# Patient Record
Sex: Female | Born: 1955 | Race: Black or African American | Hispanic: No | Marital: Single | Smoking: Never smoker
Health system: Southern US, Community
[De-identification: ages and names within clinical notes are randomized; demographics above are authoritative.]

## PROBLEM LIST (undated history)

## (undated) DIAGNOSIS — F0390 Unspecified dementia without behavioral disturbance: Secondary | ICD-10-CM

## (undated) DIAGNOSIS — F32A Depression, unspecified: Secondary | ICD-10-CM

## (undated) DIAGNOSIS — G4730 Sleep apnea, unspecified: Secondary | ICD-10-CM

## (undated) DIAGNOSIS — I639 Cerebral infarction, unspecified: Secondary | ICD-10-CM

## (undated) DIAGNOSIS — I10 Essential (primary) hypertension: Secondary | ICD-10-CM

## (undated) DIAGNOSIS — I2510 Atherosclerotic heart disease of native coronary artery without angina pectoris: Secondary | ICD-10-CM

## (undated) DIAGNOSIS — E119 Type 2 diabetes mellitus without complications: Secondary | ICD-10-CM

## (undated) DIAGNOSIS — M1990 Unspecified osteoarthritis, unspecified site: Secondary | ICD-10-CM

## (undated) DIAGNOSIS — E039 Hypothyroidism, unspecified: Secondary | ICD-10-CM

## (undated) DIAGNOSIS — N183 Chronic kidney disease, stage 3 unspecified: Secondary | ICD-10-CM

## (undated) HISTORY — PX: OTHER SURGICAL HISTORY: SHX169

---

## 1996-12-06 DIAGNOSIS — I10 Essential (primary) hypertension: Secondary | ICD-10-CM | POA: Insufficient documentation

## 2000-07-05 DIAGNOSIS — G4730 Sleep apnea, unspecified: Secondary | ICD-10-CM

## 2007-06-30 DIAGNOSIS — Z905 Acquired absence of kidney: Secondary | ICD-10-CM

## 2007-06-30 DIAGNOSIS — C641 Malignant neoplasm of right kidney, except renal pelvis: Secondary | ICD-10-CM

## 2007-06-30 HISTORY — DX: Acquired absence of kidney: Z90.5

## 2007-06-30 HISTORY — DX: Malignant neoplasm of right kidney, except renal pelvis: C64.1

## 2007-06-30 HISTORY — PX: NEPHRECTOMY: SHX65

## 2008-08-02 ENCOUNTER — Emergency Department: Payer: Self-pay | Admitting: Emergency Medicine

## 2009-05-08 DIAGNOSIS — K8020 Calculus of gallbladder without cholecystitis without obstruction: Secondary | ICD-10-CM | POA: Insufficient documentation

## 2012-03-21 DIAGNOSIS — C649 Malignant neoplasm of unspecified kidney, except renal pelvis: Secondary | ICD-10-CM | POA: Insufficient documentation

## 2012-06-29 DIAGNOSIS — I2699 Other pulmonary embolism without acute cor pulmonale: Secondary | ICD-10-CM

## 2012-06-29 HISTORY — DX: Other pulmonary embolism without acute cor pulmonale: I26.99

## 2012-10-03 DIAGNOSIS — I2699 Other pulmonary embolism without acute cor pulmonale: Secondary | ICD-10-CM | POA: Insufficient documentation

## 2013-04-01 ENCOUNTER — Emergency Department: Payer: Self-pay | Admitting: Emergency Medicine

## 2013-04-01 LAB — COMPREHENSIVE METABOLIC PANEL
Albumin: 4 g/dL (ref 3.4–5.0)
Alkaline Phosphatase: 87 U/L (ref 50–136)
BUN: 26 mg/dL — ABNORMAL HIGH (ref 7–18)
Co2: 27 mmol/L (ref 21–32)
Creatinine: 1.58 mg/dL — ABNORMAL HIGH (ref 0.60–1.30)
EGFR (Non-African Amer.): 35 — ABNORMAL LOW
Glucose: 121 mg/dL — ABNORMAL HIGH (ref 65–99)
Osmolality: 280 (ref 275–301)
SGOT(AST): 28 U/L (ref 15–37)
SGPT (ALT): 26 U/L (ref 12–78)
Total Protein: 8.3 g/dL — ABNORMAL HIGH (ref 6.4–8.2)

## 2013-04-01 LAB — URINALYSIS, COMPLETE
Bilirubin,UR: NEGATIVE
Blood: NEGATIVE
Glucose,UR: NEGATIVE mg/dL (ref 0–75)
Ketone: NEGATIVE
Leukocyte Esterase: NEGATIVE
Nitrite: NEGATIVE
Ph: 6 (ref 4.5–8.0)
Protein: NEGATIVE
RBC,UR: 2 /HPF (ref 0–5)
Specific Gravity: 1.014 (ref 1.003–1.030)
Squamous Epithelial: 1
WBC UR: <1 /HPF (ref 0–5)

## 2013-04-01 LAB — CBC
MCH: 32.2 pg (ref 26.0–34.0)
MCV: 95 fL (ref 80–100)
Platelet: 218 10*3/uL (ref 150–440)
RDW: 13.9 % (ref 11.5–14.5)

## 2013-04-01 LAB — LIPASE, BLOOD: Lipase: 284 U/L (ref 73–393)

## 2013-04-01 LAB — PROTIME-INR: INR: 1.9

## 2013-04-01 IMAGING — CT CT ABD-PELV W/O CM
1 of 2 series · 15 of 32 positions shown, 19 images · non-contrast
Comparison: none

REASON FOR EXAM: (1) R flank pain, h/o R kidney removed; (2) R flank
pain, h/o R kidney removed
COMMENTS:

[Series 2: 3mm soft tissue · axial · 0.86mm/px · z∈[-1122,-672]mm · 15 of 164 slices shown, 19 images]
[im 7/164  soft-tissue]
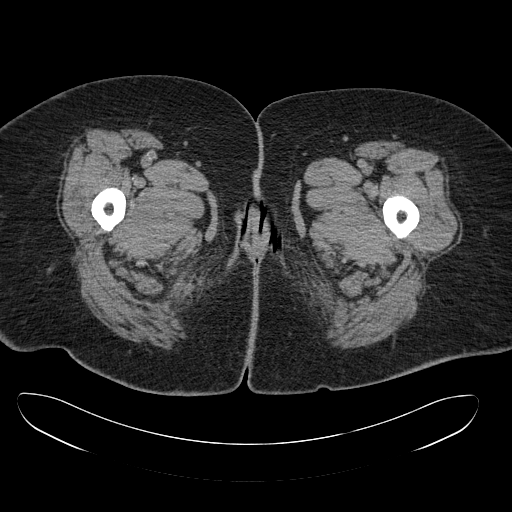
[im 7/164  bone]
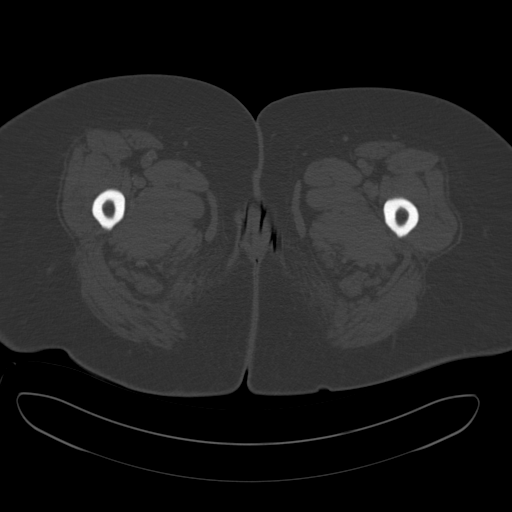
[im 20/164  soft-tissue]
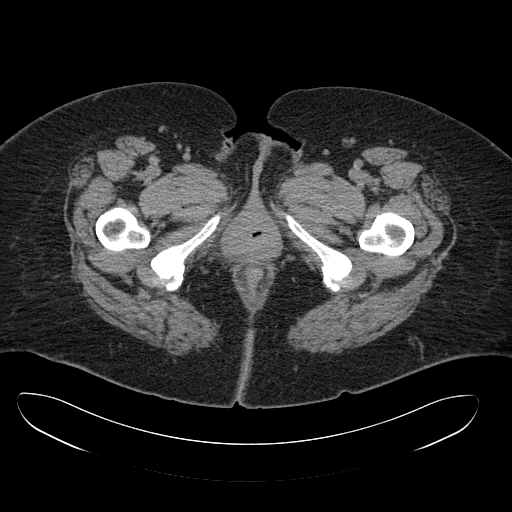
[im 33/164  soft-tissue]
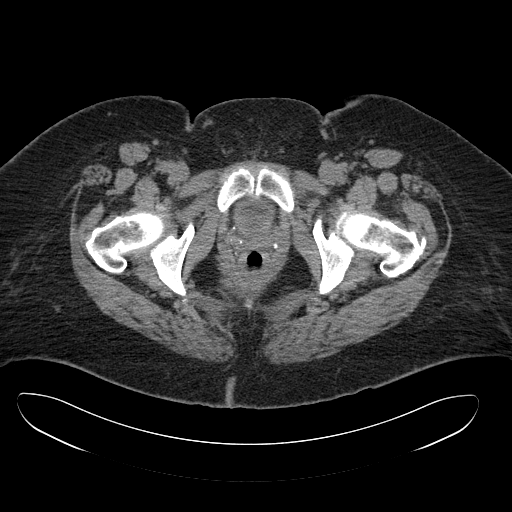
[im 46/164  soft-tissue]
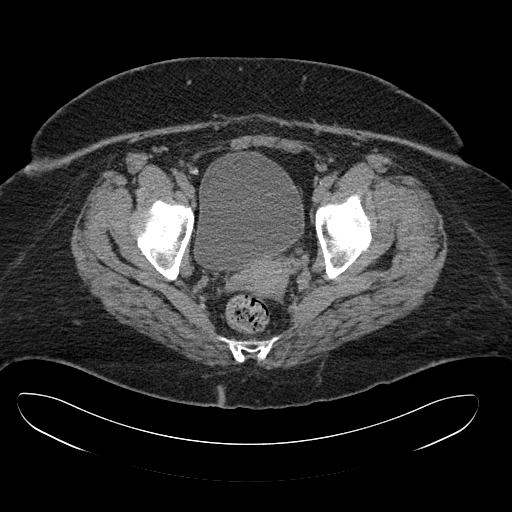
[im 59/164  soft-tissue]
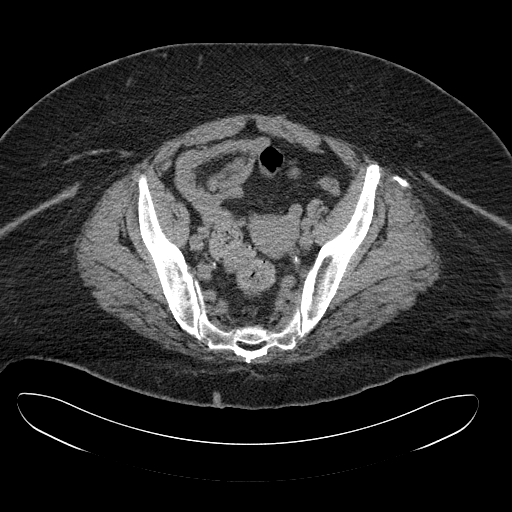
[im 72/164  soft-tissue]
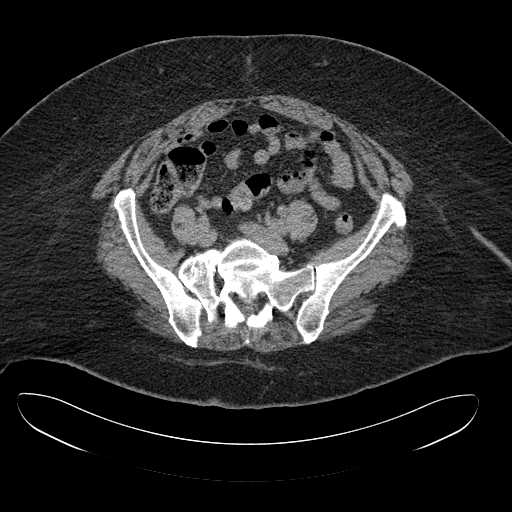
[im 85/164  soft-tissue]
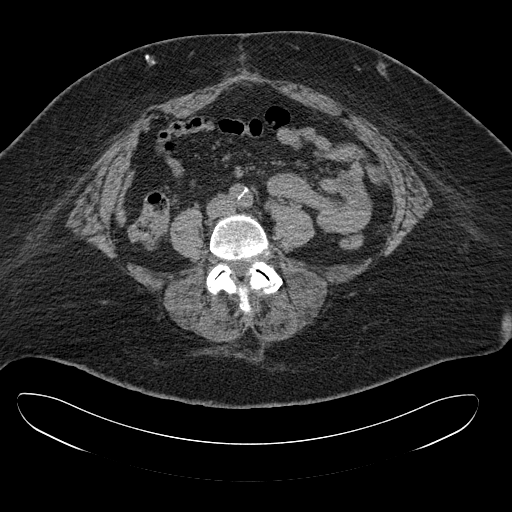
[im 92/164  soft-tissue]
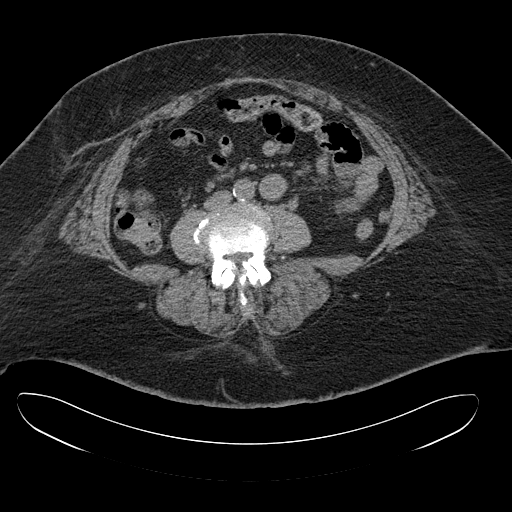
[im 105/164  soft-tissue]
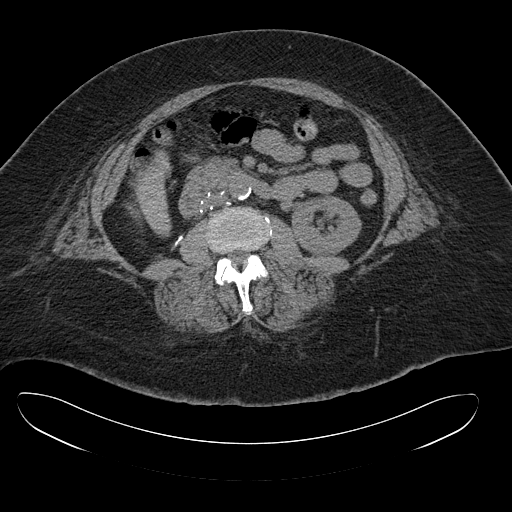
[im 105/164  bone]
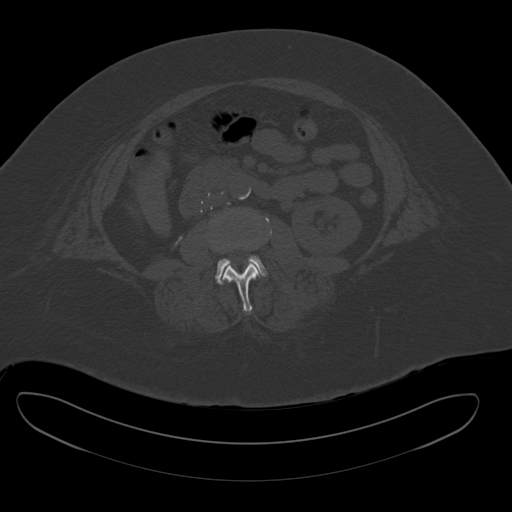
[im 118/164  soft-tissue]
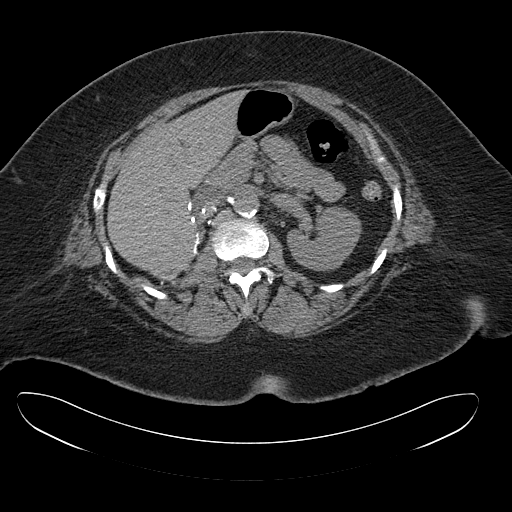
[im 131/164  soft-tissue]
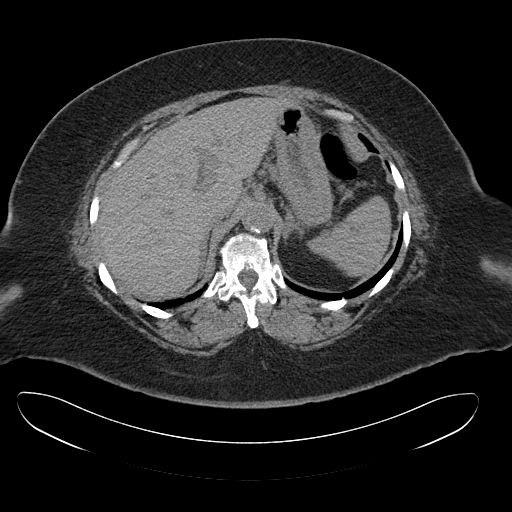
[im 137/164  lung]
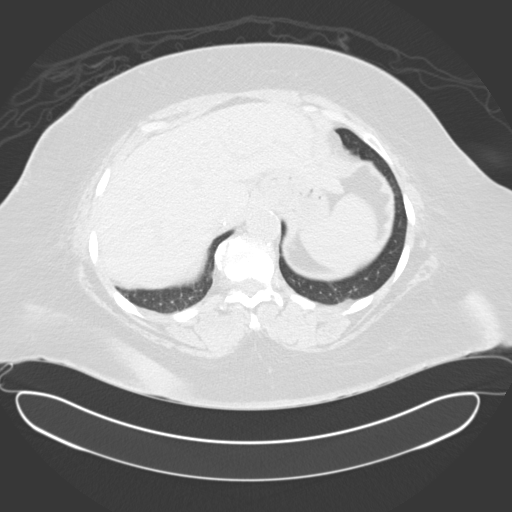
[im 144/164  soft-tissue]
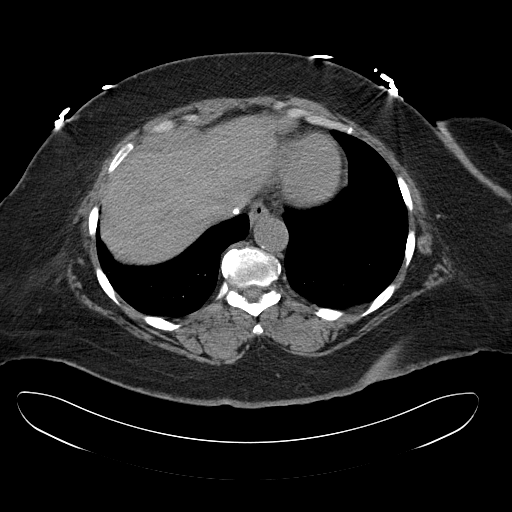
[im 144/164  lung]
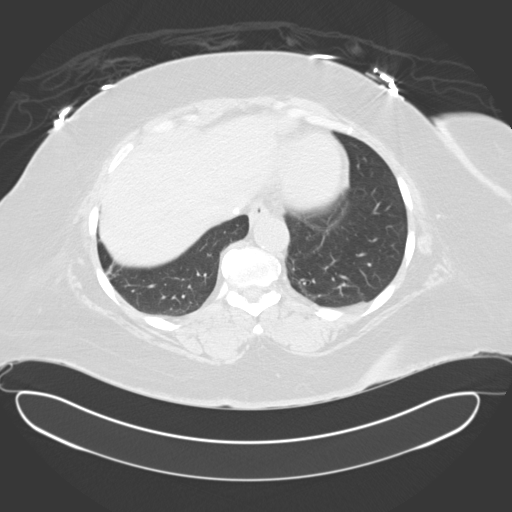
[im 150/164  lung]
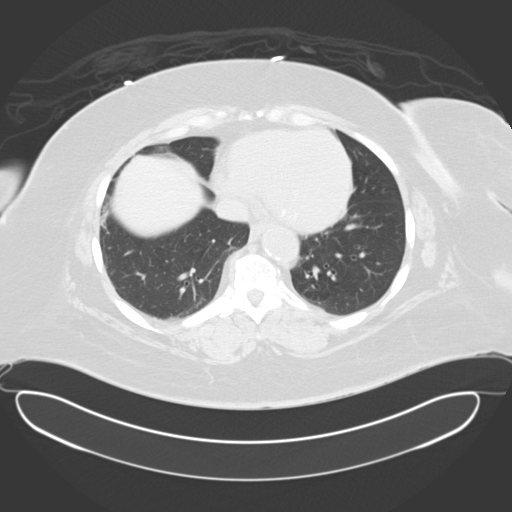
[im 157/164  soft-tissue]
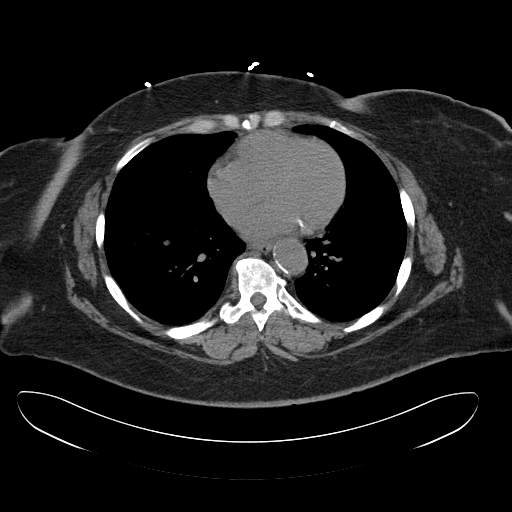
[im 157/164  lung]
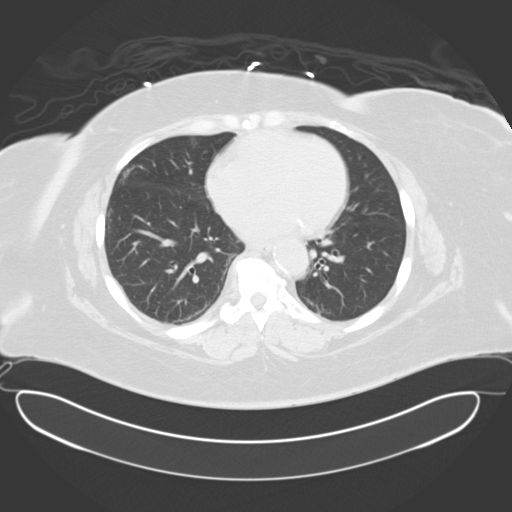

[15 of 32 positions shown; findings below may reference images not displayed]

PROCEDURE:     CT  - CT ABDOMEN AND PELVIS W[DATE]  [DATE]

RESULT:     Axial noncontrast CT scanning was performed through the abdomen
and pelvis with reconstructions at 3 mm intervals and slice thicknesses.
Review of multiplanar reconstructed images was performed separately on the
VIA monitor. There are no previous studies with which to compare.

The left kidney is normal in contour and demonstrates compensatory
hypertrophy. No stones or obstruction or inflammatory changes are
demonstrated. There are surgical clips in the right renal fossa. There is an
inferior vena caval filter present. There are surgical clips in the
gallbladder fossa from previous cholecystectomy. The liver exhibits normal
density and contour with no evidence of stones. The spleen is not enlarged.
The stomach is nondistended. The pancreas exhibits no acute abnormality. The
left adrenal gland is normal in appearance. A small portion of the right
adrenal gland appears to remain. The caliber of the abdominal aorta is
normal. The unopacified loops of small and large bowel are normal in
appearance.

The partially distended urinary bladder is normal in appearance. The uterus
is small appropriate for age. There are no adnexal masses and there is no
free pelvic fluid. There is no inguinal nor umbilical hernia.

The lumbar vertebral bodies are preserved in height. The lung bases exhibit
no infiltrates. There is minimal pleural thickening versus fibrosis
posteriorly in the left costophrenic gutter.
IMPRESSION: 1. The study is limited without oral or intravenous contrast material.
2. There are no findings suspicious for malignancy or other acute
abnormality in the right renal fossa. The left kidney is normal in
appearance.
3. There is an inferior vena caval filter present. The gallbladder is
surgically absent.
4. There is no intra-abdominal nor pelvic lymphadenopathy nor evidence of
ascites. There is no psoas abscess.

[REDACTED]

## 2014-02-19 DIAGNOSIS — J309 Allergic rhinitis, unspecified: Secondary | ICD-10-CM | POA: Insufficient documentation

## 2014-02-19 DIAGNOSIS — E039 Hypothyroidism, unspecified: Secondary | ICD-10-CM | POA: Insufficient documentation

## 2014-02-19 DIAGNOSIS — G629 Polyneuropathy, unspecified: Secondary | ICD-10-CM | POA: Insufficient documentation

## 2014-02-19 DIAGNOSIS — E559 Vitamin D deficiency, unspecified: Secondary | ICD-10-CM | POA: Insufficient documentation

## 2014-02-19 DIAGNOSIS — F332 Major depressive disorder, recurrent severe without psychotic features: Secondary | ICD-10-CM | POA: Insufficient documentation

## 2014-02-19 DIAGNOSIS — M170 Bilateral primary osteoarthritis of knee: Secondary | ICD-10-CM | POA: Insufficient documentation

## 2014-02-19 DIAGNOSIS — E538 Deficiency of other specified B group vitamins: Secondary | ICD-10-CM | POA: Insufficient documentation

## 2014-11-14 DIAGNOSIS — E119 Type 2 diabetes mellitus without complications: Secondary | ICD-10-CM | POA: Insufficient documentation

## 2016-10-22 DIAGNOSIS — E669 Obesity, unspecified: Secondary | ICD-10-CM | POA: Insufficient documentation

## 2017-04-08 ENCOUNTER — Ambulatory Visit: Payer: Self-pay | Admitting: Podiatry

## 2017-05-18 ENCOUNTER — Ambulatory Visit: Payer: Medicaid Other | Admitting: Podiatry

## 2017-05-25 ENCOUNTER — Ambulatory Visit: Payer: Medicaid Other | Admitting: Podiatry

## 2017-05-25 ENCOUNTER — Ambulatory Visit (INDEPENDENT_AMBULATORY_CARE_PROVIDER_SITE_OTHER): Payer: Medicaid Other

## 2017-05-25 DIAGNOSIS — M21619 Bunion of unspecified foot: Secondary | ICD-10-CM

## 2017-05-25 DIAGNOSIS — L97519 Non-pressure chronic ulcer of other part of right foot with unspecified severity: Secondary | ICD-10-CM

## 2017-05-25 DIAGNOSIS — M216X9 Other acquired deformities of unspecified foot: Secondary | ICD-10-CM | POA: Diagnosis not present

## 2017-05-25 DIAGNOSIS — Z7901 Long term (current) use of anticoagulants: Secondary | ICD-10-CM | POA: Insufficient documentation

## 2017-05-25 DIAGNOSIS — I639 Cerebral infarction, unspecified: Secondary | ICD-10-CM | POA: Insufficient documentation

## 2017-05-25 DIAGNOSIS — E785 Hyperlipidemia, unspecified: Secondary | ICD-10-CM | POA: Insufficient documentation

## 2017-05-25 DIAGNOSIS — L989 Disorder of the skin and subcutaneous tissue, unspecified: Secondary | ICD-10-CM

## 2017-05-27 NOTE — Progress Notes (Signed)
   Subjective: 61 year old female presents today as a new patient with a complaint of pain to the right second toe that has been present for the past 3 months. She states she is concerned she may have an abscess on the toe. She has not done anything to treat her symptoms. There are no modifying factors noted. Patient is here for further evaluation and treatment.   No past medical history on file.   Objective:  Physical Exam General: Alert and oriented x3 in no acute distress  Dermatology: Hyperkeratotic lesion present on the right second toe. Pain on palpation with a central nucleated core noted.  Skin is warm, dry and supple bilateral lower extremities. Negative for open lesions or macerations.  Vascular: Palpable pedal pulses bilaterally. No edema or erythema noted. Capillary refill within normal limits.  Neurological: Epicritic and protective threshold diminished bilaterally.   Musculoskeletal Exam: Pain on palpation at the keratotic lesion noted. Range of motion within normal limits bilateral. Muscle strength 5/5 in all groups bilateral. Clinical evidence of bunion deformity noted to the respective foot. There is a moderate pain on palpation range of motion of the first MPJ. Lateral deviation of the hallux noted consistent with hallux abductovalgus.  Radiographic Exam: Increased intermetatarsal angle greater than 15 with a hallux abductus angle greater than 30 noted on AP view. Moderate degenerative changes noted within the first MPJ.   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 X-Rays reviewed.  #2 Excisional debridement of keratotic lesion using a chisel blade was performed without incident.  #3 Dressed area with light dressing. #4 Recommended good shoe gear.  #5 Patient is to return to the clinic PRN.    Edrick Kins, DPM [facility]  Dr. Edrick Kins, [place]                                        [place], [place] [address]                Office [phone]  Fax [phone]

## 2021-10-19 ENCOUNTER — Other Ambulatory Visit: Payer: Self-pay

## 2021-10-19 ENCOUNTER — Encounter: Payer: Self-pay | Admitting: Emergency Medicine

## 2021-10-19 ENCOUNTER — Emergency Department: Payer: Medicare Other

## 2021-10-19 ENCOUNTER — Emergency Department
Admission: EM | Admit: 2021-10-19 | Discharge: 2021-10-19 | Disposition: A | Discharge status: Home / Self Care | Payer: Medicare Other | Attending: Emergency Medicine | Admitting: Emergency Medicine

## 2021-10-19 DIAGNOSIS — E119 Type 2 diabetes mellitus without complications: Secondary | ICD-10-CM | POA: Insufficient documentation

## 2021-10-19 DIAGNOSIS — R109 Unspecified abdominal pain: Secondary | ICD-10-CM | POA: Diagnosis not present

## 2021-10-19 DIAGNOSIS — R778 Other specified abnormalities of plasma proteins: Secondary | ICD-10-CM | POA: Insufficient documentation

## 2021-10-19 DIAGNOSIS — F0390 Unspecified dementia without behavioral disturbance: Secondary | ICD-10-CM | POA: Insufficient documentation

## 2021-10-19 DIAGNOSIS — Z79899 Other long term (current) drug therapy: Secondary | ICD-10-CM | POA: Insufficient documentation

## 2021-10-19 DIAGNOSIS — Z7901 Long term (current) use of anticoagulants: Secondary | ICD-10-CM | POA: Insufficient documentation

## 2021-10-19 DIAGNOSIS — I10 Essential (primary) hypertension: Secondary | ICD-10-CM | POA: Insufficient documentation

## 2021-10-19 DIAGNOSIS — M48061 Spinal stenosis, lumbar region without neurogenic claudication: Secondary | ICD-10-CM

## 2021-10-19 HISTORY — DX: Essential (primary) hypertension: I10

## 2021-10-19 LAB — COMPREHENSIVE METABOLIC PANEL
ALT: 20 U/L (ref 0–44)
AST: 24 U/L (ref 15–41)
Albumin: 3.9 g/dL (ref 3.5–5.0)
Alkaline Phosphatase: 76 U/L (ref 38–126)
Anion gap: 8 (ref 5–15)
BUN: 27 mg/dL — ABNORMAL HIGH (ref 8–23)
CO2: 29 mmol/L (ref 22–32)
Calcium: 9.8 mg/dL (ref 8.9–10.3)
Chloride: 103 mmol/L (ref 98–111)
Creatinine, Ser: 1.2 mg/dL — ABNORMAL HIGH (ref 0.44–1.00)
GFR, Estimated: 49 mL/min — ABNORMAL LOW (ref >60)
Glucose, Bld: 100 mg/dL — ABNORMAL HIGH (ref 70–99)
Potassium: 3.8 mmol/L (ref 3.5–5.1)
Sodium: 140 mmol/L (ref 135–145)
Total Bilirubin: 0.6 mg/dL (ref 0.3–1.2)
Total Protein: 7.8 g/dL (ref 6.5–8.1)

## 2021-10-19 LAB — URINALYSIS, ROUTINE W REFLEX MICROSCOPIC
Bilirubin Urine: NEGATIVE
Glucose, UA: NEGATIVE mg/dL
Hgb urine dipstick: NEGATIVE
Ketones, ur: NEGATIVE mg/dL
Leukocytes,Ua: NEGATIVE
Nitrite: NEGATIVE
Protein, ur: NEGATIVE mg/dL
Specific Gravity, Urine: 1.025 (ref 1.005–1.030)
pH: 7 (ref 5.0–8.0)

## 2021-10-19 LAB — CBC WITH DIFFERENTIAL/PLATELET
Abs Immature Granulocytes: 0.02 10*3/uL (ref 0.00–0.07)
Basophils Absolute: 0 10*3/uL (ref 0.0–0.1)
Basophils Relative: 0 %
Eosinophils Absolute: 0.2 10*3/uL (ref 0.0–0.5)
Eosinophils Relative: 3 %
HCT: 38.5 % (ref 36.0–46.0)
Hemoglobin: 12.2 g/dL (ref 12.0–15.0)
Immature Granulocytes: 0 %
Lymphocytes Relative: 20 %
Lymphs Abs: 1.3 10*3/uL (ref 0.7–4.0)
MCH: 31 pg (ref 26.0–34.0)
MCHC: 31.7 g/dL (ref 30.0–36.0)
MCV: 98 fL (ref 80.0–100.0)
Monocytes Absolute: 0.7 10*3/uL (ref 0.1–1.0)
Monocytes Relative: 11 %
Neutro Abs: 4.1 10*3/uL (ref 1.7–7.7)
Neutrophils Relative %: 66 %
Platelets: 262 10*3/uL (ref 150–400)
RBC: 3.93 MIL/uL (ref 3.87–5.11)
RDW: 12.7 % (ref 11.5–15.5)
WBC: 6.4 10*3/uL (ref 4.0–10.5)
nRBC: 0 % (ref 0.0–0.2)

## 2021-10-19 LAB — TROPONIN I (HIGH SENSITIVITY): Troponin I (High Sensitivity): 9 ng/L (ref <18)

## 2021-10-19 LAB — PROTIME-INR
INR: 1.1 (ref 0.8–1.2)
Prothrombin Time: 13.6 seconds (ref 11.4–15.2)

## 2021-10-19 LAB — LIPASE, BLOOD: Lipase: 50 U/L (ref 11–51)

## 2021-10-19 IMAGING — MR MR THORACIC SPINE W/O CM
5 of 6 series · 31 of 48 positions shown · non-contrast
Comparison: None.

CLINICAL DATA: Low back pain with increased fracture risk. Flank
pain.

EXAM:
MRI THORACIC AND LUMBAR SPINE WITHOUT CONTRAST
TECHNIQUE: Multiplanar and multiecho pulse sequences of the thoracic and lumbar
spine were obtained without intravenous contrast.

[Series 18: T1 · sagittal · 6.0mm · 1.41mm/px · 3 of 9 slices shown (1 of 2)]
[im 1/9]
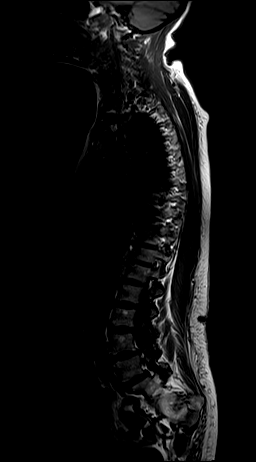
[im 5/9]
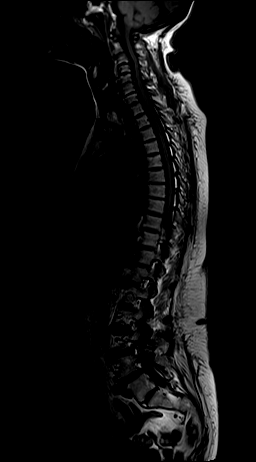
[im 9/9]
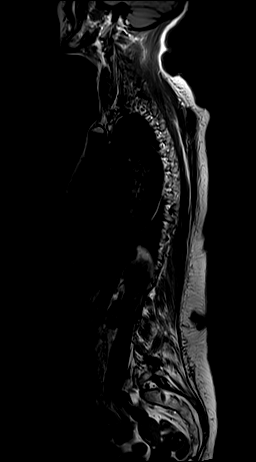

[Series 19: T2 · sagittal · 3.0mm · 1.06mm/px · 7 of 22 slices shown (1 of 2)]
[im 1/22]
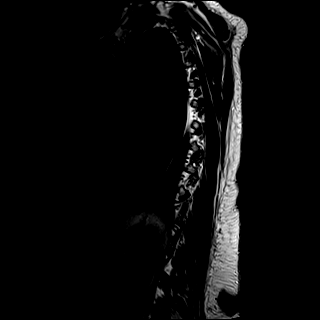
[im 4/22]
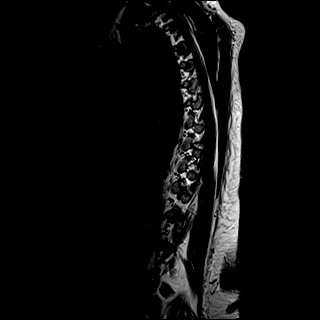
[im 8/22]
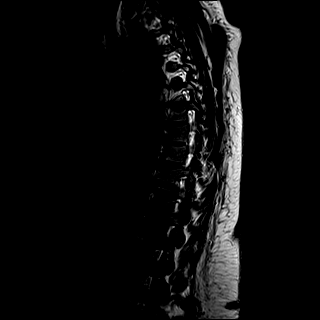
[im 11/22]
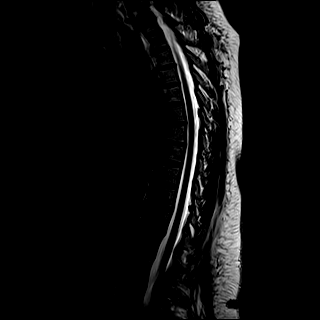
[im 15/22]
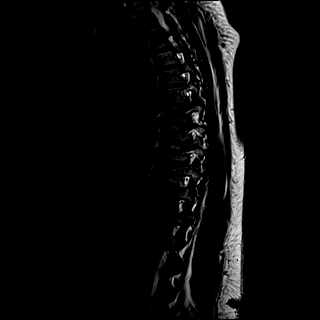
[im 18/22]
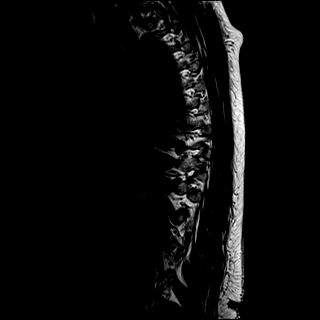
[im 22/22]
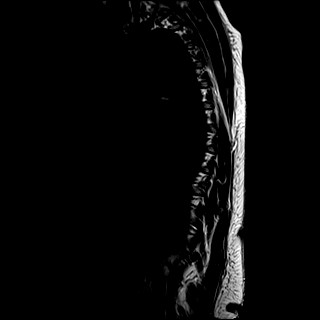

[Series 20: T1 · sagittal · 3.0mm · 1.06mm/px · 7 of 22 slices shown (2 of 2)]
[im 1/22]
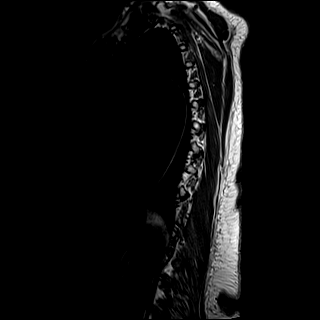
[im 4/22]
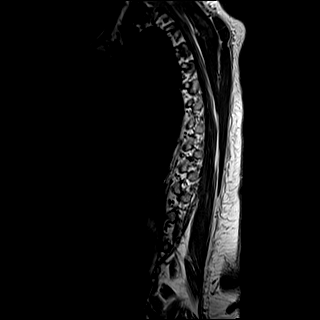
[im 8/22]
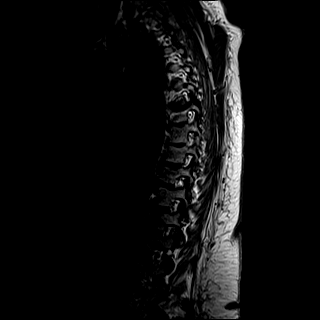
[im 11/22]
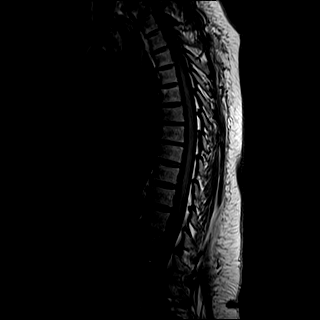
[im 15/22]
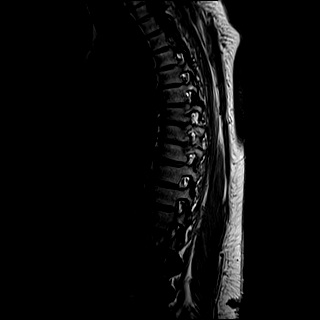
[im 18/22]
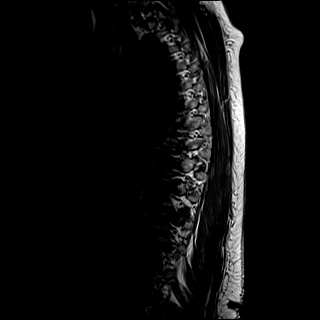
[im 22/22]
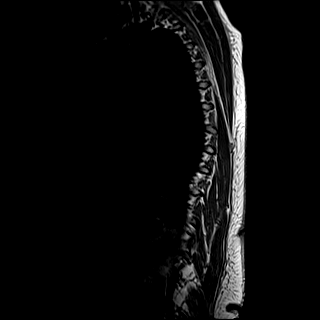

[Series 21: STIR · sagittal · 3.0mm · 0.53mm/px · 5 of 22 slices shown]
[im 1/22]
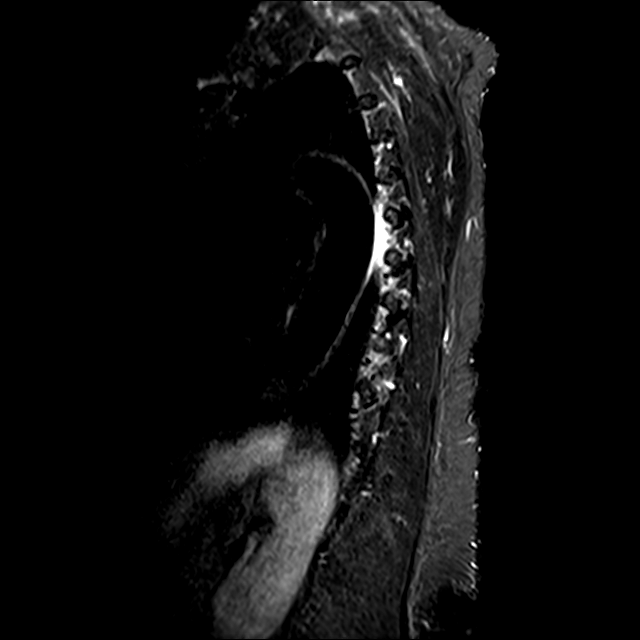
[im 4/22]
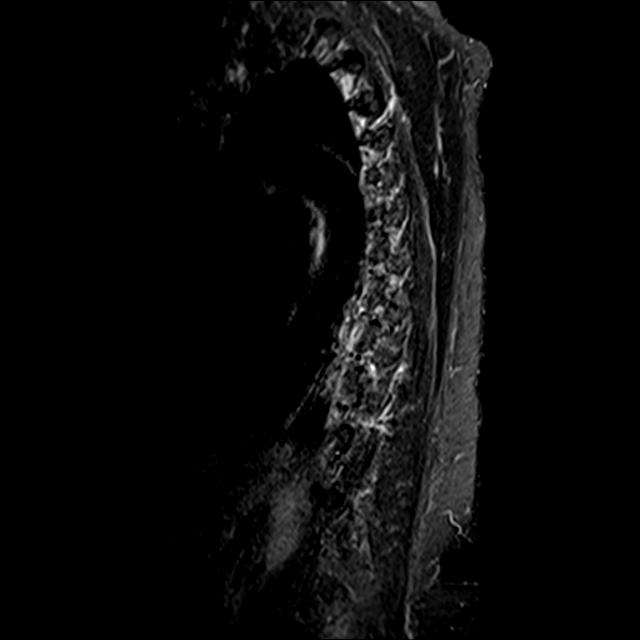
[im 8/22]
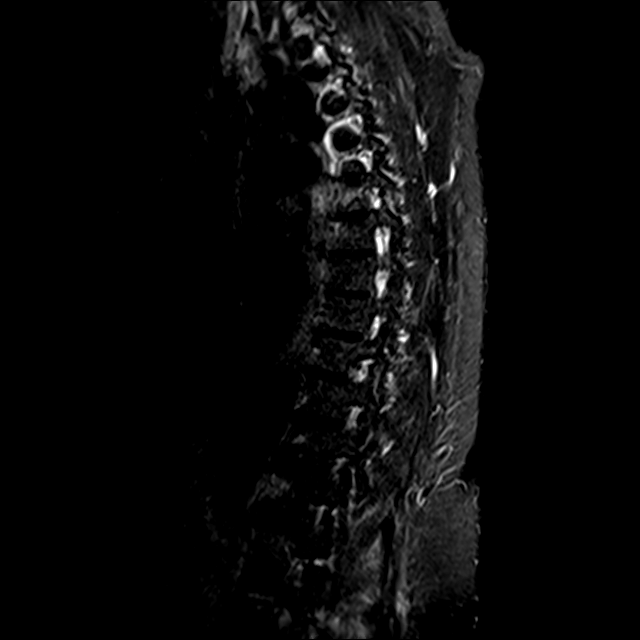
[im 11/22]
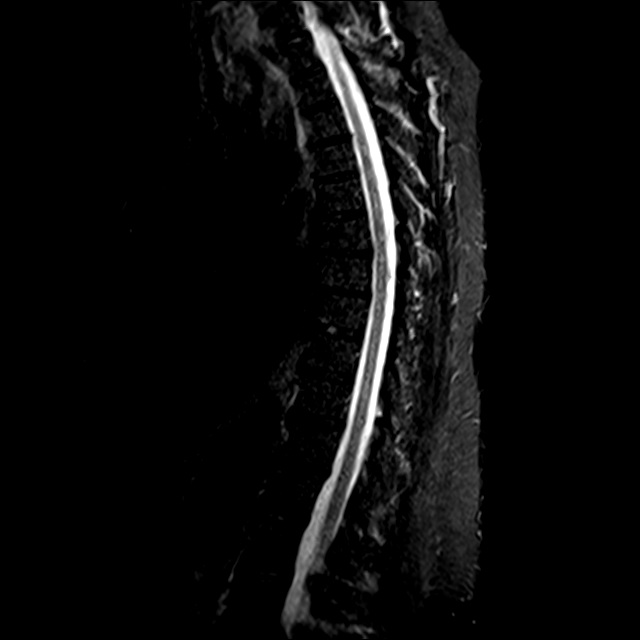
[im 15/22]
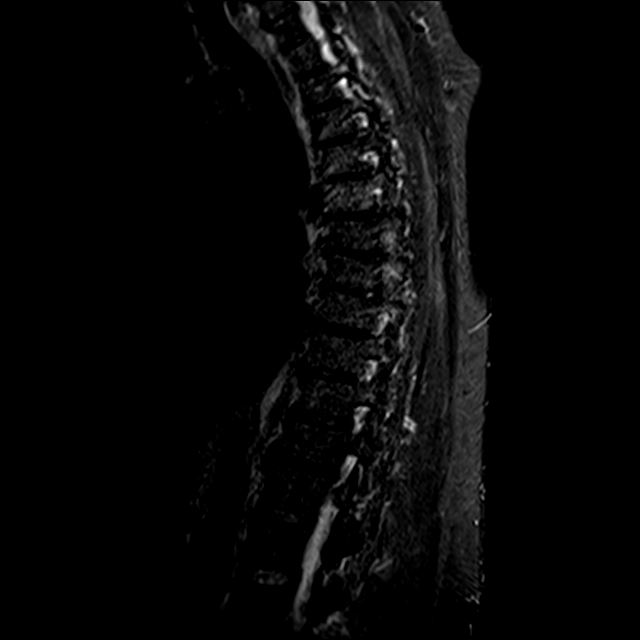

[Series 22: T2 · axial · 4.0mm · 0.59mm/px · z∈[-94,+116]mm · 9 of 39 slices shown (2 of 2)]
[im 1/39]
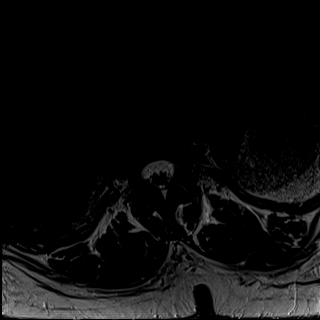
[im 7/39]
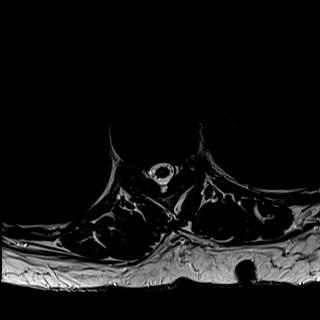
[im 11/39]
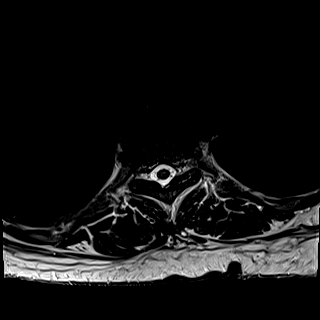
[im 18/39]
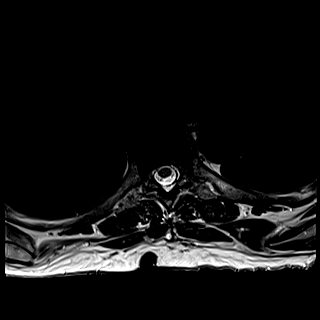
[im 21/39]
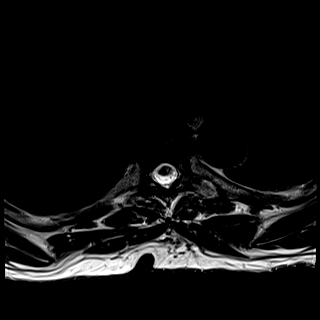
[im 28/39]
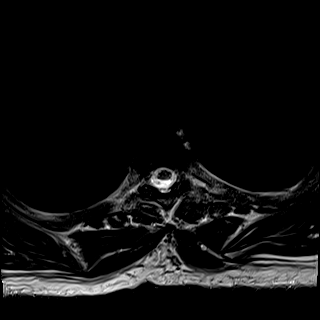
[im 32/39]
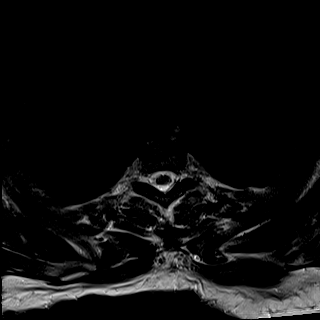
[im 35/39]
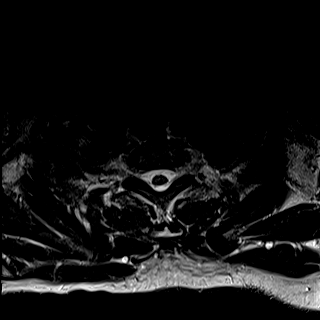
[im 39/39]
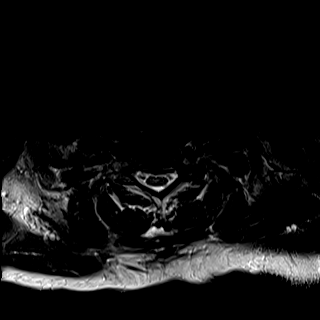

[31 of 48 positions shown; findings below may reference images not displayed]

FINDINGS: MRI THORACIC SPINE FINDINGS

Alignment:  Negative for listhesis.

Vertebrae: No fracture, evidence of discitis, or bone lesion.

Cord:  Normal signal and morphology.

Paraspinal and other soft tissues: Right nephrectomy

Disc levels:

Generalized spondylitic spurring. Midthoracic disc space narrowing
greatest at T6-7 and T7-8. Up to mild facet spurring most notable on
the right at T5-6. Diffusely patent canal and foramina

MRI LUMBAR SPINE FINDINGS

Segmentation: Transitional lumbosacral vertebra numbered L5 from
above.

Alignment: Scoliosis and L4-5 anterolisthesis. Slight L3-4
anterolisthesis

Vertebrae:  No fracture, evidence of discitis, or bone lesion.

Conus medullaris and cauda equina: Conus extends to the L1-2 level.
Conus and cauda equina appear normal.

Paraspinal and other soft tissues: Negative for perispinal mass or
inflammation. Right nephrectomy

Disc levels:

T12- L1: Unremarkable.

L1-L2: Unremarkable.

L2-L3: Mild disc narrowing and biforaminal bulging. Facet spurring
and ligamentum flavum thickening. Mild spinal stenosis.

L3-L4: Disc narrowing and circumferential bulging. Facet spurring
and ligamentum flavum thickening. High-grade spinal stenosis. Mild
to moderate left foraminal narrowing

L4-L5: Facet osteoarthritis with spurring and anterolisthesis. The
disc is narrowed and bulging with a right foraminal protrusion.
Moderate spinal stenosis.

L5-S1:Incomplete segmentation.  No neural impingement
IMPRESSION: MR THORACIC SPINE IMPRESSION

No acute finding.  Negative for compression fracture or impingement.

MR LUMBAR SPINE IMPRESSION

1. No acute finding.
2. Transitional lumbosacral vertebra numbered L5.
3. Lumbar spine degeneration especially affecting facets, with
scoliosis.
4. Spinal stenosis at L2-3 to L4-5, high-grade at L3-4.
5. Mild-to-moderate foraminal narrowings described above.

## 2021-10-19 IMAGING — CT CT ANGIO CHEST-ABD-PELV FOR DISSECTION W/ AND WO/W CM
2 of 7 series · 12 of 46 positions shown, 14 images · non-contrast
Comparison: CT of the abdomen and pelvis [DATE].

CLINICAL DATA: 66-year-old female with history of sudden onset of
left-sided flank and abdominal pain, concerning for potential acute
aortic syndrome.

EXAM:
CT ANGIOGRAPHY CHEST, ABDOMEN AND PELVIS
TECHNIQUE: Non-contrast CT of the chest was initially obtained.

[Series 5: arterial · axial · arterial · 0.84mm/px · z∈[-864,-322]mm · 9 of 305 slices shown, 11 images]
[im 17/305  soft-tissue]
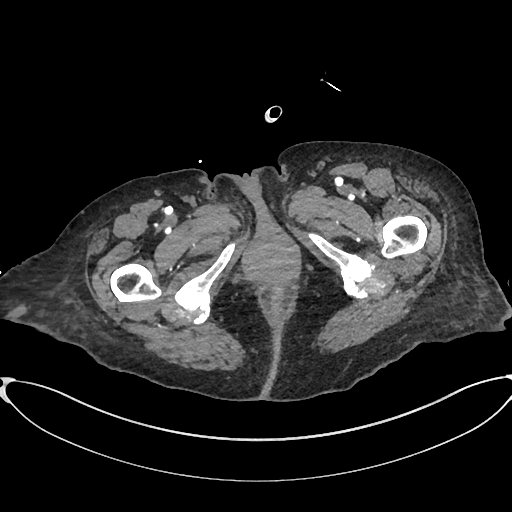
[im 17/305  bone]
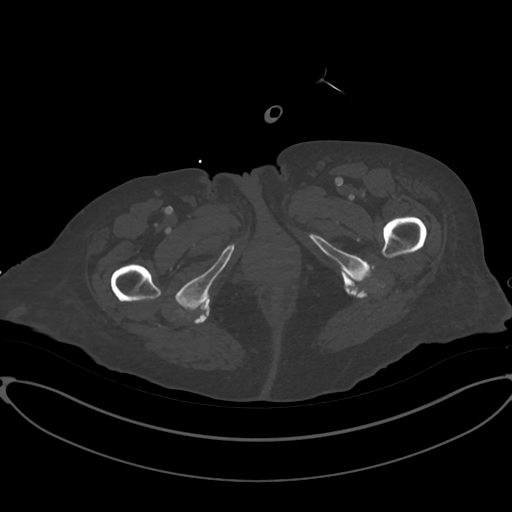
[im 51/305  soft-tissue]
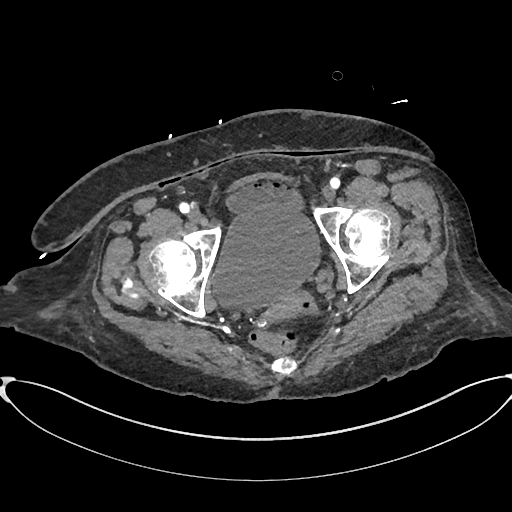
[im 85/305  soft-tissue]
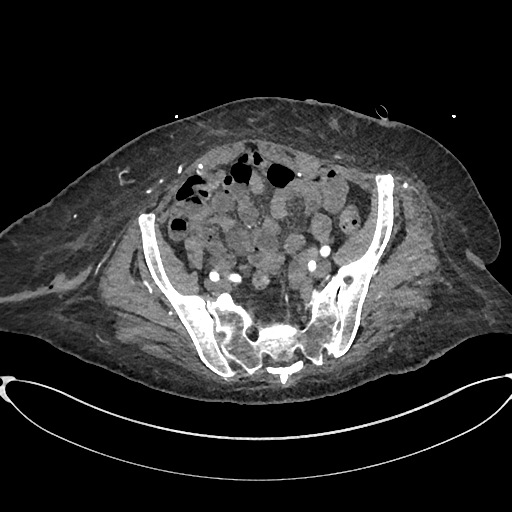
[im 119/305  soft-tissue]
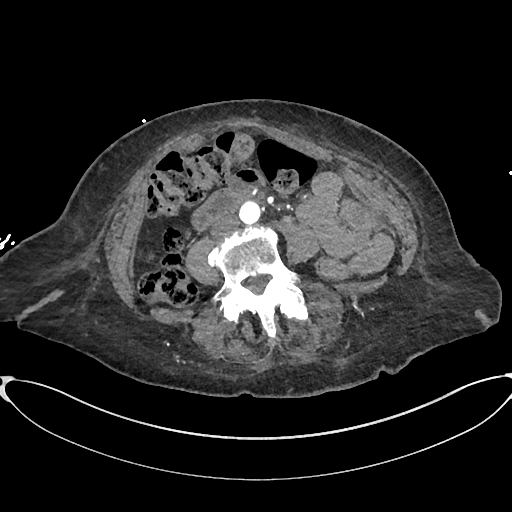
[im 153/305  soft-tissue]
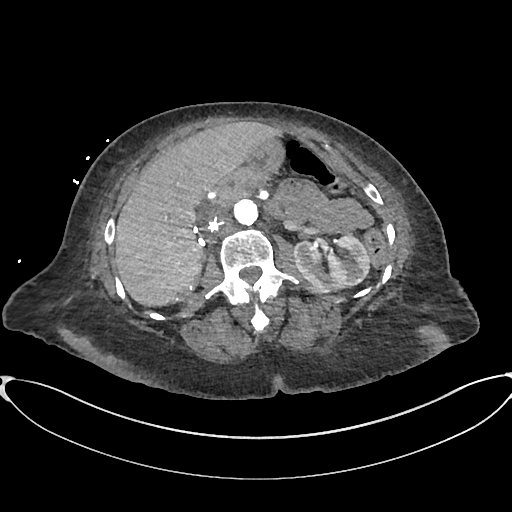
[im 186/305  soft-tissue]
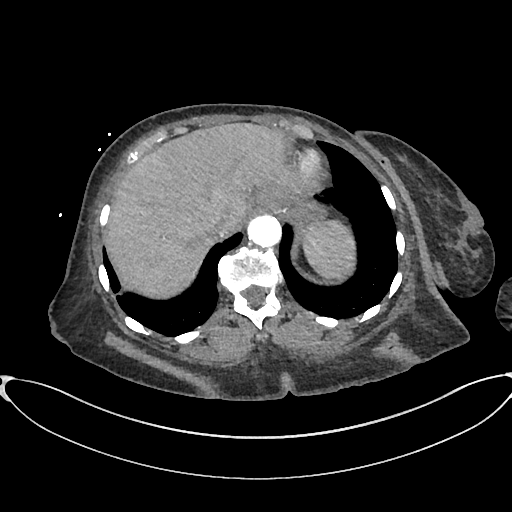
[im 220/305  soft-tissue]
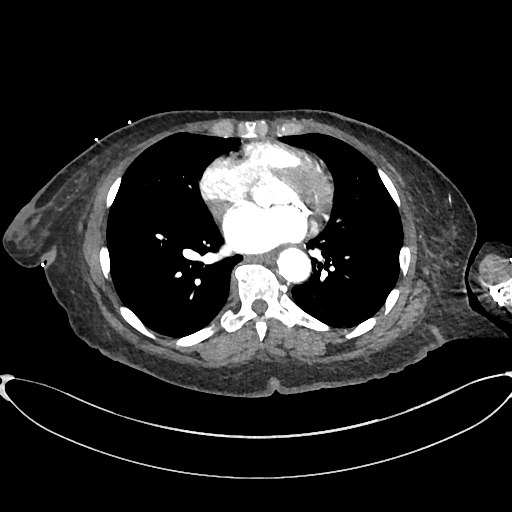
[im 254/305  soft-tissue]
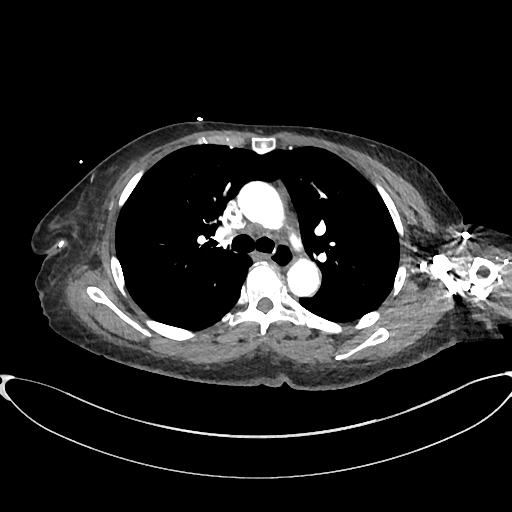
[im 288/305  soft-tissue]
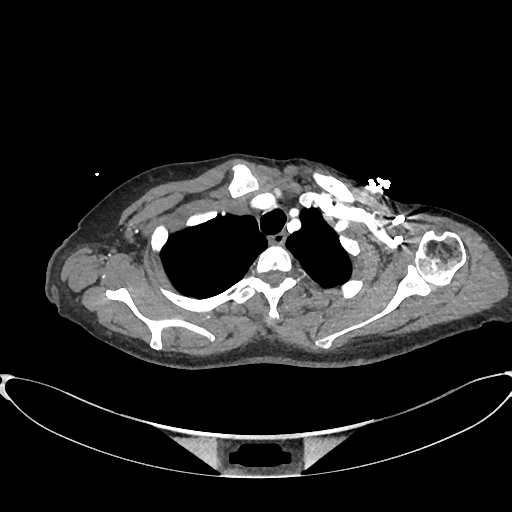
[im 288/305  bone]
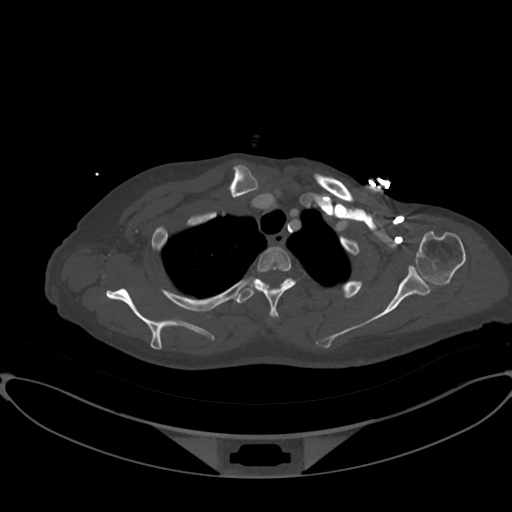

[Series 8: cor · coronal · 0.81mm/px · 3 of 150 slices shown]
[im 38/150  soft-tissue]
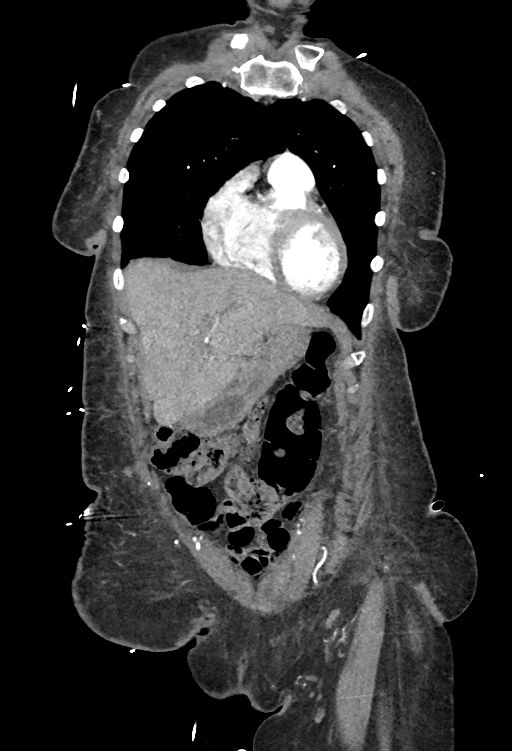
[im 75/150  soft-tissue]
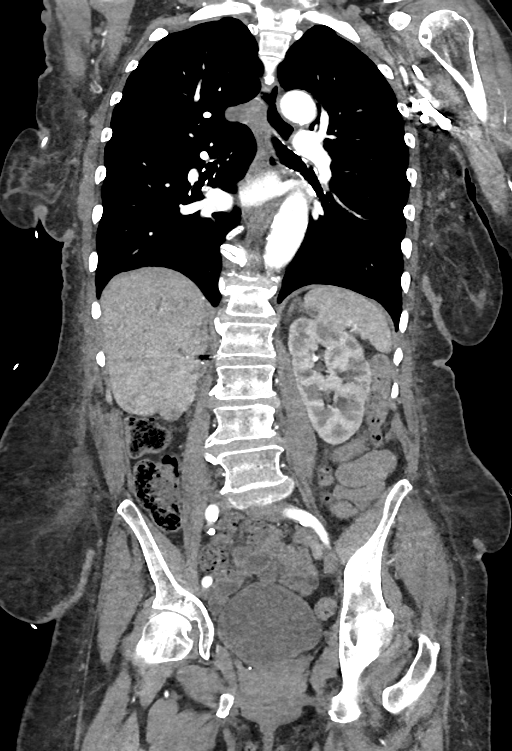
[im 112/150  soft-tissue]
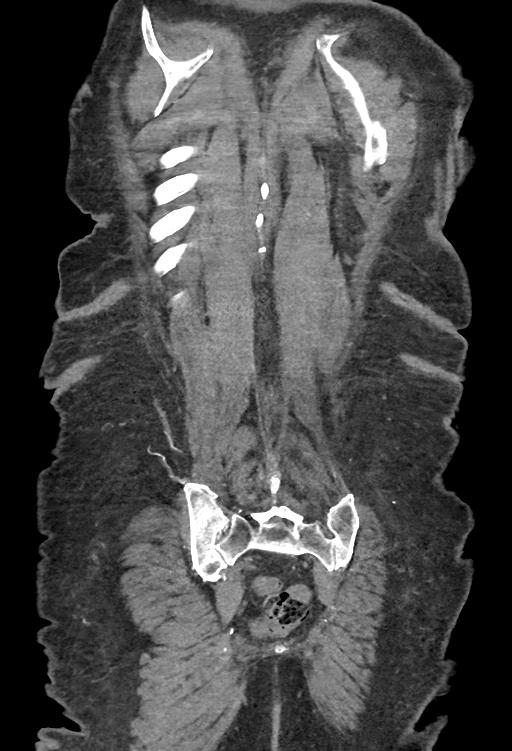

[12 of 46 positions shown; findings below may reference images not displayed]

Multidetector CT imaging through the chest, abdomen and pelvis was
performed using the standard protocol during bolus administration of
intravenous contrast. Multiplanar reconstructed images and MIPs were
obtained and reviewed to evaluate the vascular anatomy.

RADIATION DOSE REDUCTION: This exam was performed according to the
departmental dose-optimization program which includes automated
exposure control, adjustment of the mA and/or kV according to
patient size and/or use of iterative reconstruction technique.

CONTRAST:  75mL OMNIPAQUE IOHEXOL 350 MG/ML SOLN
FINDINGS: CTA CHEST FINDINGS

Cardiovascular: Precontrast images demonstrate no crescentic high
attenuation associated with the wall of the thoracic aorta to
suggest the presence of acute intramural hemorrhage. No aneurysm or
dissection noted of the thoracic aorta or great vessels of the
mediastinum. There is aortic atherosclerosis, as well as
atherosclerosis of the great vessels of the mediastinum and the
coronary arteries, including calcified atherosclerotic plaque in the
left anterior descending coronary arteries. Heart size is normal.
There is no significant pericardial fluid, thickening or pericardial
calcification. Thickening calcification of the aortic valve and
mitral annulus.

Mediastinum/Nodes: No pathologically enlarged mediastinal or hilar
lymph nodes. Esophagus is unremarkable in appearance. No axillary
lymphadenopathy.

Lungs/Pleura: A few patchy areas of peripheral predominant
ground-glass attenuation and septal thickening are noted throughout
the mid to lower lungs bilaterally. No confluent consolidative
airspace disease. No pleural effusions. No pneumothorax. No definite
suspicious appearing pulmonary nodules or masses are noted.

Musculoskeletal: Several old healed posterolateral right-sided rib
fractures are noted. There are no aggressive appearing lytic or
blastic lesions noted in the visualized portions of the skeleton.

Review of the MIP images confirms the above findings.

CTA ABDOMEN AND PELVIS FINDINGS

VASCULAR

Aorta: Extensive aortic atherosclerosis, without evidence of
aneurysm, dissection, significant stenosis or evidence of
vasculitis.

Celiac: Patent without evidence of aneurysm, dissection, vasculitis
or significant stenosis.

SMA: Patent without evidence of aneurysm, dissection, vasculitis or
significant stenosis.

Renals: Both renal arteries are patent without evidence of aneurysm,
dissection, vasculitis, fibromuscular dysplasia or significant
stenosis.

IMA: Patent without evidence of aneurysm, dissection, vasculitis or
significant stenosis.

Inflow: Patent without evidence of aneurysm, dissection, vasculitis
or significant stenosis.

Veins: No obvious venous abnormality within the limitations of this
arterial phase study. IVC filter in position with tip terminating
immediately below the level of the left renal vein.

Review of the MIP images confirms the above findings.

NON-VASCULAR

Hepatobiliary: No suspicious cystic or solid hepatic lesions. Status
post cholecystectomy. Mild intra and extrahepatic biliary ductal
dilatation appears similar to prior studies, likely reflective of
benign chronic post cholecystectomy physiology.

Pancreas: No pancreatic mass. No pancreatic ductal dilatation. No
pancreatic or peripancreatic fluid collections or inflammatory
changes.

Spleen: Unremarkable.

Adrenals/Urinary Tract: Status post right radical nephrectomy. No
unexpected soft tissue mass in the nephrectomy bed. Multifocal
cortical thinning in the left kidney. 1.5 cm low-attenuation lesion
in the upper pole of the left kidney, compatible with a simple cyst.
No suspicious renal lesions. No hydroureteronephrosis. Urinary
bladder is unremarkable in appearance.

Stomach/Bowel: The appearance of the stomach is normal. There is no
pathologic dilatation of small bowel or colon. The appendix is not
confidently identified and may be surgically absent. Regardless,
there are no inflammatory changes noted adjacent to the cecum to
suggest the presence of an acute appendicitis at this time.

Lymphatic: No lymphadenopathy noted in the abdomen or pelvis.

Reproductive: Uterus and ovaries are atrophic.

Other: No significant volume of ascites.  No pneumoperitoneum.

Musculoskeletal: There are no aggressive appearing lytic or blastic
lesions noted in the visualized portions of the skeleton.

Review of the MIP images confirms the above findings.
IMPRESSION: 1. Aortic atherosclerosis, in addition to left anterior descending
coronary artery disease. However, there is no evidence of acute
aortic syndrome.
2. Very mild patchy peripheral predominant areas of ground-glass
attenuation and septal thickening in the mid to lower lungs
bilaterally. These findings are nonspecific, but could be indicative
of early or mild interstitial lung disease. Outpatient referral to
Pulmonology for further clinical evaluation is recommended.
Follow-up high-resolution chest CT is recommended in 6-12 months to
assess for temporal changes in the appearance of the lung
parenchyma.
3. No acute findings are noted in the abdomen or pelvis.
4. Status post right radical nephrectomy. No findings to suggest
local recurrence of disease or definite metastatic disease in the
chest, abdomen or pelvis.
5. There are calcifications of the aortic valve and mitral annulus.
Echocardiographic correlation for evaluation of potential valvular
dysfunction may be warranted if clinically indicated.
6. Additional incidental findings, as above.

## 2021-10-19 IMAGING — MR MR LUMBAR SPINE W/O CM
4 of 5 series · 34 of 48 positions shown · non-contrast
Comparison: None.

CLINICAL DATA: Low back pain with increased fracture risk. Flank
pain.

EXAM:
MRI THORACIC AND LUMBAR SPINE WITHOUT CONTRAST
TECHNIQUE: Multiplanar and multiecho pulse sequences of the thoracic and lumbar
spine were obtained without intravenous contrast.

[Series 16: T2 · sagittal · 4.0mm · 0.81mm/px · 8 of 20 slices shown (1 of 2)]
[im 1/20]
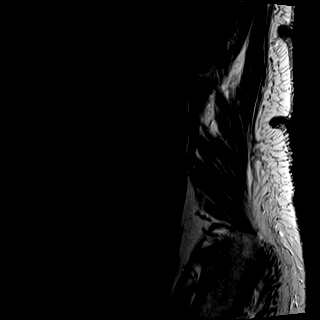
[im 3/20]
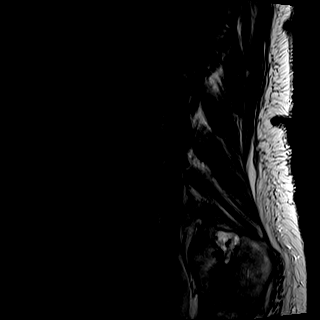
[im 6/20]
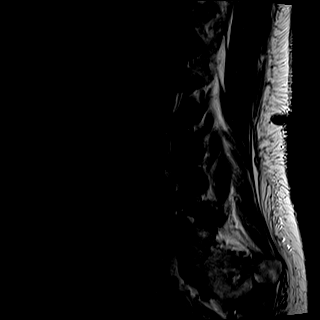
[im 9/20]
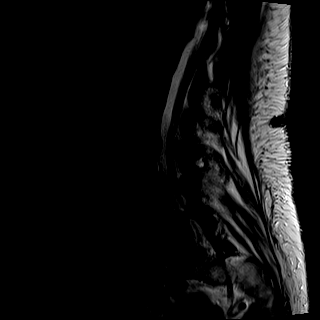
[im 11/20]
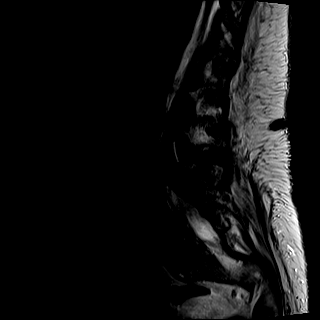
[im 14/20]
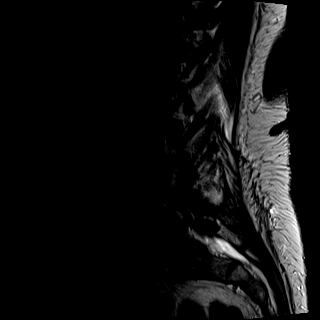
[im 17/20]
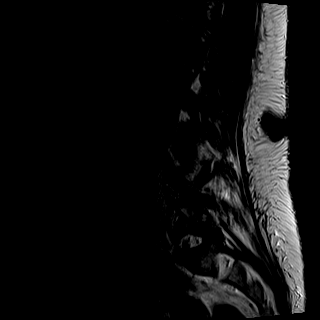
[im 20/20]
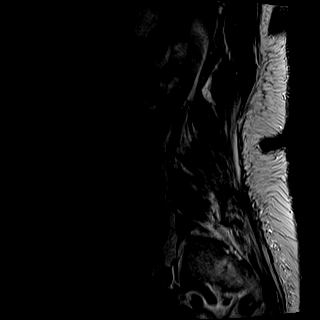

[Series 17: T1 · sagittal · 4.0mm · 0.81mm/px · 8 of 20 slices shown (1 of 2)]
[im 1/20]
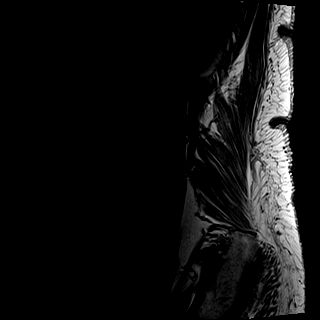
[im 3/20]
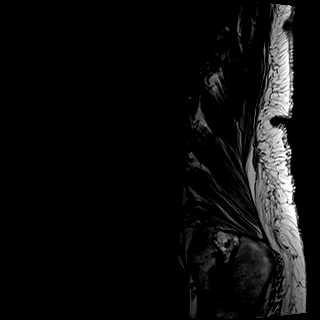
[im 6/20]
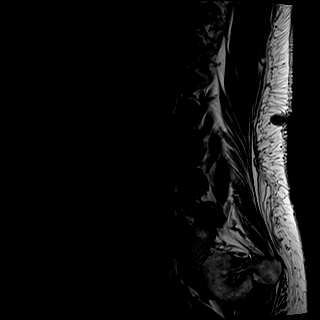
[im 9/20]
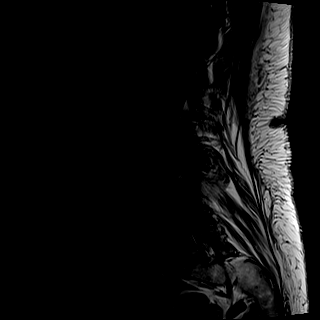
[im 11/20]
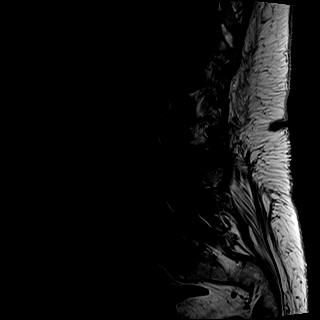
[im 14/20]
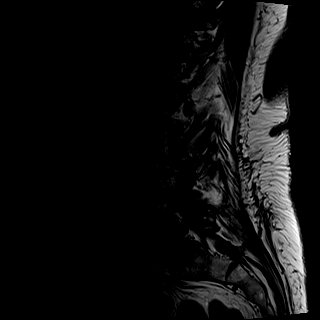
[im 17/20]
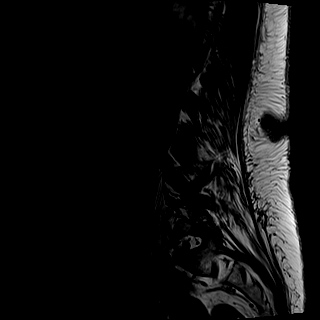
[im 20/20]
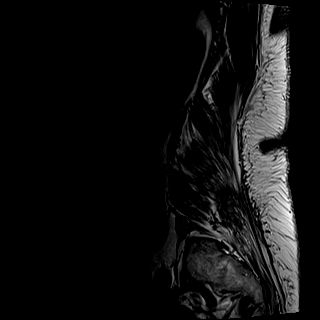

[Series 19: T2 · axial · 4.0mm · 0.78mm/px · z∈[-336,-95]mm · 9 of 31 slices shown (2 of 2)]
[im 1/31]
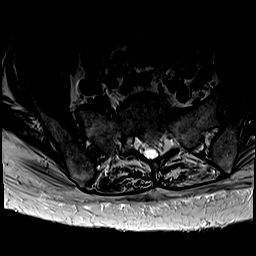
[im 6/31]
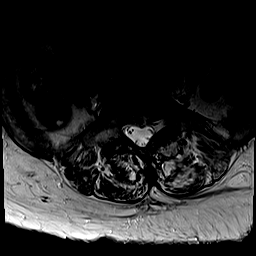
[im 9/31]
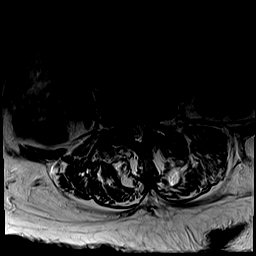
[im 14/31]
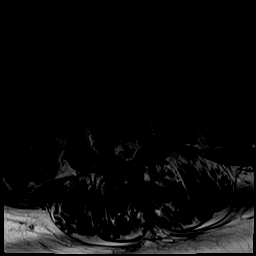
[im 17/31]
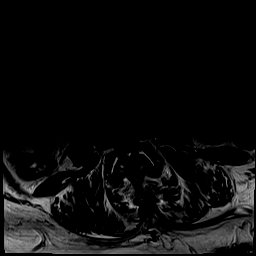
[im 22/31]
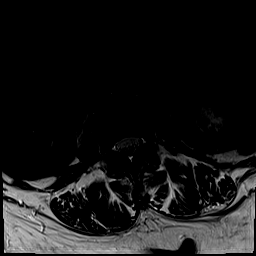
[im 25/31]
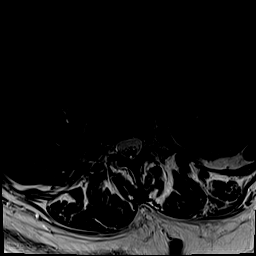
[im 28/31]
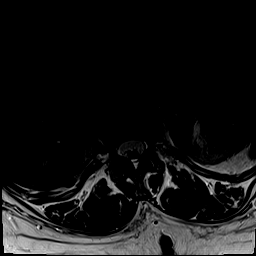
[im 31/31]
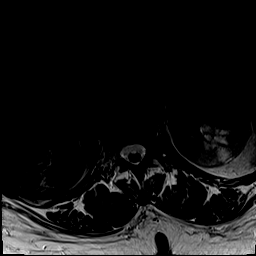

[Series 20: T1 · axial · 4.0mm · 0.39mm/px · z∈[-336,-95]mm · 9 of 31 slices shown (2 of 2)]
[im 1/31]
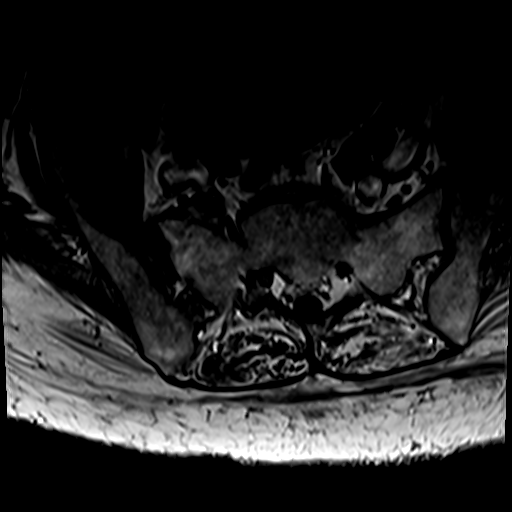
[im 6/31]
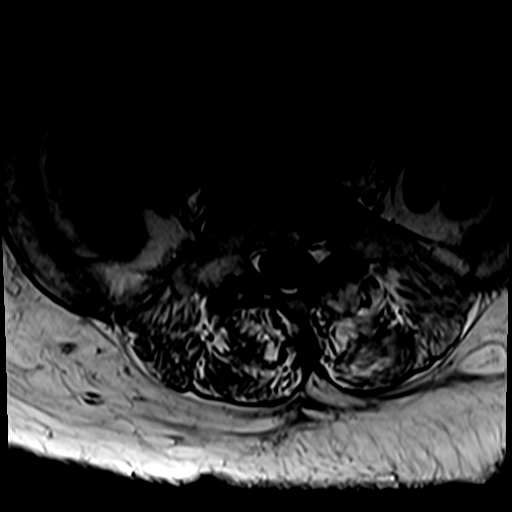
[im 9/31]
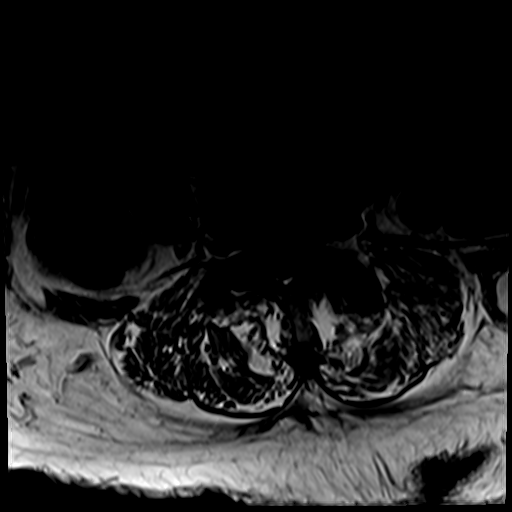
[im 14/31]
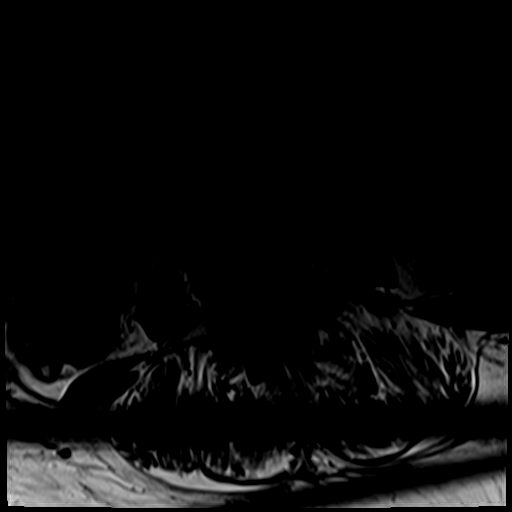
[im 17/31]
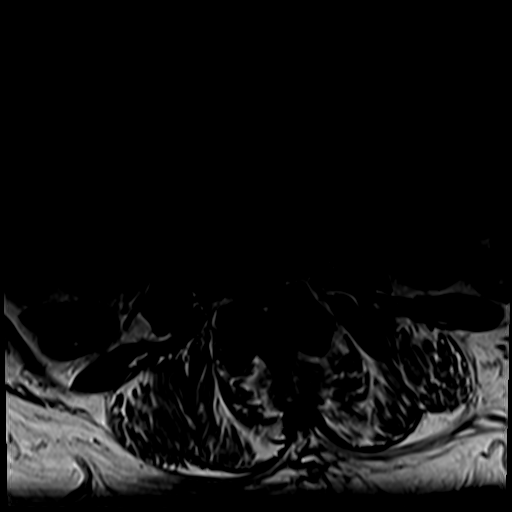
[im 22/31]
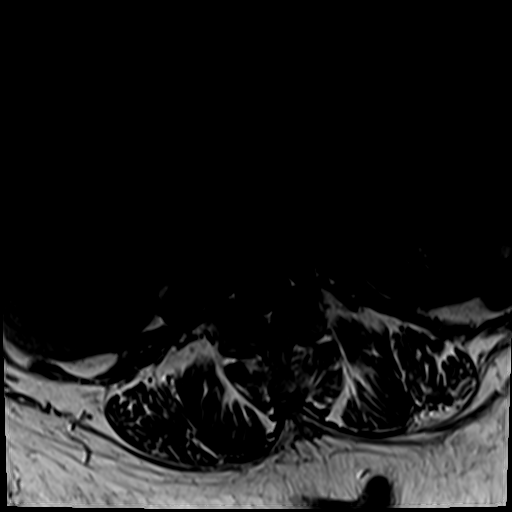
[im 25/31]
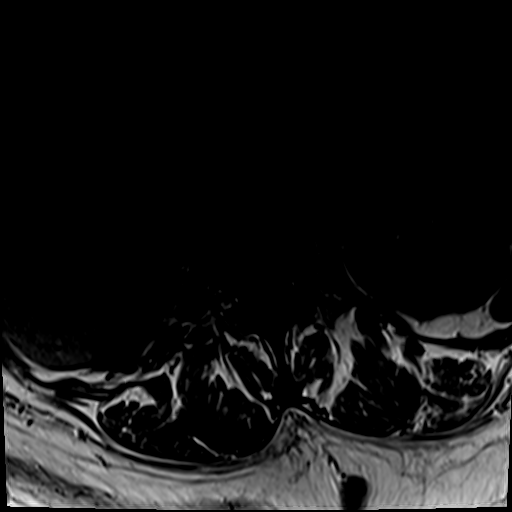
[im 28/31]
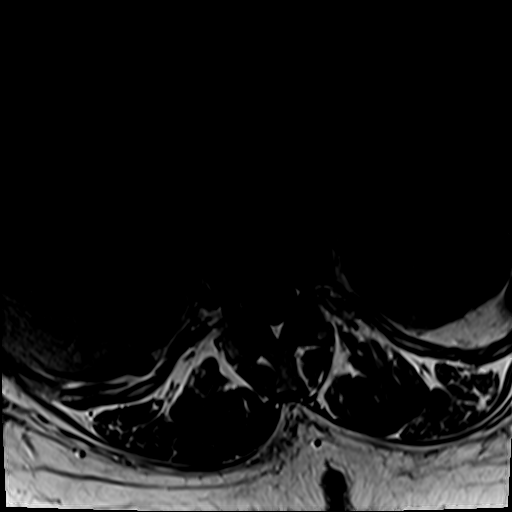
[im 31/31]
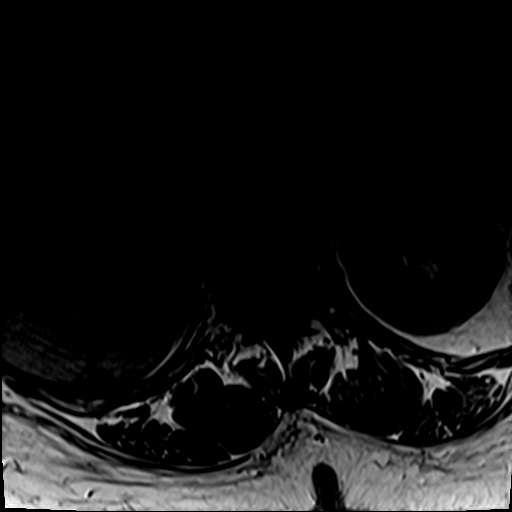

[34 of 48 positions shown; findings below may reference images not displayed]

FINDINGS: MRI THORACIC SPINE FINDINGS

Alignment:  Negative for listhesis.

Vertebrae: No fracture, evidence of discitis, or bone lesion.

Cord:  Normal signal and morphology.

Paraspinal and other soft tissues: Right nephrectomy

Disc levels:

Generalized spondylitic spurring. Midthoracic disc space narrowing
greatest at T6-7 and T7-8. Up to mild facet spurring most notable on
the right at T5-6. Diffusely patent canal and foramina

MRI LUMBAR SPINE FINDINGS

Segmentation: Transitional lumbosacral vertebra numbered L5 from
above.

Alignment: Scoliosis and L4-5 anterolisthesis. Slight L3-4
anterolisthesis

Vertebrae:  No fracture, evidence of discitis, or bone lesion.

Conus medullaris and cauda equina: Conus extends to the L1-2 level.
Conus and cauda equina appear normal.

Paraspinal and other soft tissues: Negative for perispinal mass or
inflammation. Right nephrectomy

Disc levels:

T12- L1: Unremarkable.

L1-L2: Unremarkable.

L2-L3: Mild disc narrowing and biforaminal bulging. Facet spurring
and ligamentum flavum thickening. Mild spinal stenosis.

L3-L4: Disc narrowing and circumferential bulging. Facet spurring
and ligamentum flavum thickening. High-grade spinal stenosis. Mild
to moderate left foraminal narrowing

L4-L5: Facet osteoarthritis with spurring and anterolisthesis. The
disc is narrowed and bulging with a right foraminal protrusion.
Moderate spinal stenosis.

L5-S1:Incomplete segmentation.  No neural impingement
IMPRESSION: MR THORACIC SPINE IMPRESSION

No acute finding.  Negative for compression fracture or impingement.

MR LUMBAR SPINE IMPRESSION

1. No acute finding.
2. Transitional lumbosacral vertebra numbered L5.
3. Lumbar spine degeneration especially affecting facets, with
scoliosis.
4. Spinal stenosis at L2-3 to L4-5, high-grade at L3-4.
5. Mild-to-moderate foraminal narrowings described above.

## 2021-10-19 MED ORDER — MORPHINE SULFATE (PF) 4 MG/ML IV SOLN
4.0000 mg | Freq: Once | INTRAVENOUS | Status: AC
  Administered 2021-10-19: 4 mg via INTRAVENOUS
  Filled 2021-10-19: qty 1

## 2021-10-19 MED ORDER — SODIUM CHLORIDE 0.9 % IV BOLUS (SEPSIS)
1000.0000 mL | Freq: Once | INTRAVENOUS | Status: AC
  Administered 2021-10-19: 1000 mL via INTRAVENOUS

## 2021-10-19 MED ORDER — IOHEXOL 350 MG/ML SOLN
100.0000 mL | Freq: Once | INTRAVENOUS | Status: AC | PRN
  Administered 2021-10-19: 75 mL via INTRAVENOUS

## 2021-10-19 MED ORDER — ONDANSETRON HCL 4 MG/2ML IJ SOLN
4.0000 mg | Freq: Once | INTRAMUSCULAR | Status: AC
  Administered 2021-10-19: 4 mg via INTRAVENOUS
  Filled 2021-10-19: qty 2

## 2021-10-19 MED ORDER — KETOROLAC TROMETHAMINE 30 MG/ML IJ SOLN
15.0000 mg | Freq: Once | INTRAMUSCULAR | Status: AC
Start: 2021-10-19 — End: 2021-10-19
  Administered 2021-10-19: 15 mg via INTRAVENOUS
  Filled 2021-10-19: qty 1

## 2021-10-19 MED ORDER — HYDROMORPHONE HCL 1 MG/ML IJ SOLN
1.0000 mg | Freq: Once | INTRAMUSCULAR | Status: AC
  Administered 2021-10-19: 1 mg via INTRAVENOUS
  Filled 2021-10-19: qty 1

## 2021-10-19 MED ORDER — DEXAMETHASONE SODIUM PHOSPHATE 10 MG/ML IJ SOLN
10.0000 mg | Freq: Once | INTRAMUSCULAR | Status: AC
  Administered 2021-10-19: 10 mg via INTRAVENOUS
  Filled 2021-10-19: qty 1

## 2021-10-19 MED ORDER — METHOCARBAMOL 500 MG PO TABS
500.0000 mg | ORAL_TABLET | Freq: Three times a day (TID) | ORAL | 0 refills | Status: DC | PRN
Start: 2021-10-19 — End: 2023-01-06

## 2021-10-19 NOTE — ED Notes (Signed)
Pt taken to MRI at this time

## 2021-10-19 NOTE — ED Provider Notes (Addendum)
Patient signed out to me at 87 AM.  66 year old female presenting with back pain inability to ambulate.  CTA to rule out dissection is negative.  She is pending MRI of the T and L-spine.  Has no neurologic deficits. ? ?MRI of the T and L-spine are negative for acute process.  Is foraminal narrowing and some spinal stenosis.  Patient was able to ambulate with her [employer].  Has prescription for opiates already at home.  Given she is ambulating without any acute findings on work-up today she is appropriate for discharge.  Discussed findings with the patient.  Tells me that she does have hydrocodone at home.  Recommended PCP follow-up.  Will not prescribe any additional opiates at this time given she already has a prescription. ?  ?Rada Hay, MD ?10/19/21 [phone] ? ?  ?Rada Hay, MD ?10/19/21 [phone] ? ?

## 2021-10-19 NOTE — Discharge Instructions (Addendum)
Please continue your hydrocodone as prescribed.  If you feel you need stronger narcotic pain medication, please follow-up with your primary care doctor. ? ?Your CT incidentally showed some mild thickening in the lower lungs bilaterally which could be the beginnings of interstitial lung disease and we recommend follow-up with pulmonology as an outpatient. ?

## 2021-10-19 NOTE — ED Notes (Signed)
Pt ambulated in room with her [facility]ch[facility].  ?

## 2021-10-19 NOTE — ED Triage Notes (Signed)
Pt arrives via [facility] from [facility] c/o left flank x 1 day.  Pt denies dysuria, trauma in area.  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 ?

## 2021-10-19 NOTE — ED Notes (Signed)
Pt  back from MRI. Pt wasn't placed back on monitor. This RN placed pt on monitor to update vitals.  ?

## 2021-10-19 NOTE — ED Provider Notes (Addendum)
? ?[facility] ?Provider Note ? ? ? Event Date/Time  ? First MD Initiated Contact with Patient 10/19/21 0357   ?  (approximate) ? ? ?History  ? ?Left Flank Pain ? ? ?HPI ? ?Kylie [name] is a 66 y.o. female PE on Xarelto, dementia, DM, CVA, hypertension, right-sided nephrectomy from malignancy who presents to the emergency department EMS with sudden onset left-sided back and abdominal pain that started yesterday afternoon.  No fevers, nausea, vomiting, diarrhea, dysuria, hematuria, vaginal bleeding or discharge.  She is unable to tell me why her right kidney was removed.  Receives all of her care at [facility].  Denies any chest pain or shortness of breath.  No injury to her back.  She denies any falls or increased physical exertion.  No numbness, tingling, weakness, bowel or bladder incontinence, urinary retention.  No previous back surgeries or epidural injections. ? ? ?History provided by patient and EMS.  Limited as patient is an extremely poor historian.  There is no family here with her. ? ? ? ?Past Medical History:  ?Diagnosis Date  ? Hypertension   ? ? ?Past Surgical History:  ?Procedure Laterality Date  ? Rt kidney removal Right   ? ? ?MEDICATIONS:  ?Prior to Admission medications   ?Medication Sig Start Date End Date Taking? Authorizing Provider  ?acetaminophen (TYLENOL) 500 MG tablet Take 1,000 mg by mouth.    [provider]  ?albuterol (PROVENTIL HFA;VENTOLIN HFA) 108 (90 Base) MCG/ACT inhaler Inhale 1 puff every 4 to 6 hours as needed 12/23/16   [provider]  ?Cyanocobalamin (VITAMIN B-12 PO) TAKE 1 TABLET BY MOUTH ONCE DAILY 05/10/17   [provider]  ?cyclobenzaprine (FLEXERIL) 5 MG tablet TAKE 1 TABLET BY MOUTH ONCE DAILY AS NEEDED FOR MUSCLE SPASMS 06/05/16   [provider]  ?diltiazem (CARDIZEM CD) 180 MG 24 hr capsule Take 180 mg by mouth. 01/25/17   [provider]  ?EPINEPHrine 0.3 mg/0.3 mL IJ SOAJ injection Inject 0.3 mg into the  muscle. 11/24/16   [provider]  ?famotidine (PEPCID) 20 MG tablet Take 20 mg by mouth. 09/09/16   [provider]  ?fluticasone (FLONASE) 50 MCG/ACT nasal spray USE 2 SPRAYS IN EACH NOSTRIL ONCE DAILY IN THE MORNING 12/23/16   [provider]  ?gabapentin (NEURONTIN) 300 MG capsule 2 capsules (600 mg) by mouth every morning and 2 capsule (600 mg) by mouth at night. 01/25/17   [provider]  ?levothyroxine (SYNTHROID, LEVOTHROID) 100 MCG tablet TAKE 1 TABLET BY MOUTH EVERY MORNING 09/23/16   [provider]  ?lidocaine (XYLOCAINE) 2 % solution RINSE MOUTH WITH 1 ML AND SPIT DAILY AS NEEDED FOR MOUTH PAIN. 03/29/17   [provider]  ?losartan (COZAAR) 25 MG tablet Take 25 mg by mouth. 10/22/16 10/22/17  [provider]  ?mirtazapine (REMERON) 7.5 MG tablet Take 7.5 mg by mouth. 12/07/16   [provider]  ?nitroGLYCERIN (NITROSTAT) 0.4 MG SL tablet DISSOLVE 1 TAB UNDER TONGUE EVERY 5 MINS TO MAX OF 3 DOSES AS NEEDED FOR CHEST PAIN. IF NO RELIEF AFTER 3 DOSES CALL 911 OR GO TO ER. 12/23/16   [provider]  ?ondansetron (ZOFRAN) 4 MG tablet TAKE 1 TABLET BY MOUTH DAILY AS NEEDED FOR NAUSEA 02/23/17   [provider]  ?polyethylene glycol powder (GLYCOLAX/MIRALAX) powder MIX 17 GRAMS (USE MEASURE LINE IN CAP) IN 4-8 OUNCES OF WATER, JUICE, SODA, COFFEE, OR TEA AND DRINK ONCE DAILY AS NEEDED FOR CONSTIPATION 11/05/15  [provider]  ?pravastatin (PRAVACHOL) 40 MG tablet Take 40 mg by mouth. 12/23/16   [provider]  ?spironolactone (ALDACTONE) 25 MG tablet Take 25 mg by mouth. 03/17/16   [provider]  ?traMADol (ULTRAM) 50 MG tablet TAKE 1 TABLET BY MOUTH EVERY 8 HOURS AS NEEDED FOR PAIN 02/23/17   [provider]  ?Vitamin D, Cholecalciferol, 400 units TABS Take by mouth. 02/11/15   [provider]  ?warfarin (COUMADIN) 2.5 MG tablet 3 tablets (7.'5mg'$ ) every day except 4 tablets ('10mg'$ ) on  Tuesdays and Thursdays or as directed 11/24/16   [provider]  ? ? ?Physical Exam  ? ?Triage Vital Signs: ?ED Triage Vitals  ?Enc Vitals Group  ?   BP 10/19/21 0400 (!) 190/86  ?   Pulse Rate 10/19/21 0400 86  ?   Resp 10/19/21 0431 15  ?   Temp 10/19/21 0401 98.7 ?F (37.1 ?C)  ?   Temp Source 10/19/21 0401 Oral  ?   SpO2 10/19/21 0357 100 %  ?   Weight 10/19/21 0402 140 lb (63.5 kg)  ?   Height 10/19/21 0402 '5\' 10"'$  (1.778 m)  ?   Head Circumference --   ?   Peak Flow --   ?   Pain Score 10/19/21 0402 10  ?   Pain Loc --   ?   Pain Edu? --   ?   Excl. in [place]? --   ? ? ?Most recent vital signs: ?Vitals:  ? 10/19/21 0600 10/19/21 0630  ?BP: (!) 157/79 (!) 161/79  ?Pulse: 88 (!) 123  ?Resp: 12 14  ?Temp:    ?SpO2: 100% 90%  ? ? ?CONSTITUTIONAL: Alert and oriented and responds appropriately to questions.  Appears older than stated age, chronically ill-appearing, appears uncomfortable ?HEAD: Normocephalic, atraumatic ?EYES: Conjunctivae clear, pupils appear equal, sclera nonicteric ?ENT: normal nose; moist mucous membranes ?NECK: Supple, normal ROM ?CARD: RRR; S1 and S2 appreciated; no murmurs, no clicks, no rubs, no gallops ?RESP: Normal chest excursion without splinting or tachypnea; breath sounds clear and equal bilaterally; no wheezes, no rhonchi, no rales, no hypoxia or respiratory distress, speaking full sentences ?ABD/GI: Normal bowel sounds; non-distended; soft, tender throughout the left abdomen and left flank ?BACK: The back appears normal no midline spinal tenderness or step-off or deformity ?EXT: Normal ROM in all joints; no deformity noted, no edema; no cyanosis ?SKIN: Normal color for age and race; warm; no rash on exposed skin ?NEURO: Moves all extremities equally, normal speech; normal sensation diffusely, no clonus, no saddle anesthesia, no facial asymmetry ?PSYCH: The patient's mood and manner are appropriate. ? ? ?ED Results / Procedures / Treatments  ? ?LABS: ?(all labs ordered are listed,  but only abnormal results are displayed) ?Labs Reviewed  ?COMPREHENSIVE METABOLIC PANEL - Abnormal; Notable for the following components:  ?    Result Value  ? Glucose, Bld 100 (*)   ? BUN 27 (*)   ? Creatinine, Ser 1.20 (*)   ? GFR, Estimated 49 (*)   ? All other components within normal limits  ?URINALYSIS, ROUTINE W REFLEX MICROSCOPIC - Abnormal; Notable for the following components:  ? Color, Urine YELLOW (*)   ? APPearance CLEAR (*)   ? All other components within normal limits  ?CBC WITH DIFFERENTIAL/PLATELET  ?LIPASE, BLOOD  ?PROTIME-INR  ?TROPONIN I (HIGH SENSITIVITY)  ? ? ? ?EKG: ? EKG Interpretation ? ?Date/Time:  Sunday October 19 2021 05:54:26 EDT ?Ventricular Rate:  81 ?PR Interval:  186 ?QRS Duration: 101 ?QT Interval:  427 ?QTC Calculation: 496 ?R Axis:   55 ?Text Interpretation: Sinus rhythm Biatrial enlargement Left ventricular hypertrophy Borderline prolonged QT interval Confirmed by Pryor Curia [phone]) on 10/19/2021 6:04:29 AM ?  ? ?  ? ? ? ?RADIOLOGY: ?My personal review and interpretation of imaging: CT shows no dissection, aneurysm, kidney stone or other acute abnormality. ? ?I have personally reviewed all radiology reports.   ?CT Angio Chest/Abd/Pel for Dissection W and/or Wo Contrast ? ?Result Date: 10/19/2021 ?CLINICAL DATA:  66 year old female with history of sudden onset of left-sided flank and abdominal pain, concerning for potential acute aortic syndrome. EXAM: CT ANGIOGRAPHY CHEST, ABDOMEN AND PELVIS TECHNIQUE: Non-contrast CT of the chest was initially obtained. Multidetector CT imaging through the chest, abdomen and pelvis was performed using the standard protocol during bolus administration of intravenous contrast. Multiplanar reconstructed images and MIPs were obtained and reviewed to evaluate the vascular anatomy. RADIATION DOSE REDUCTION: This exam was performed according to the departmental dose-optimization program which includes automated exposure control, adjustment of the mA  and/or kV according to patient size and/or use of iterative reconstruction technique. CONTRAST:  30m OMNIPAQUE IOHEXOL 350 MG/ML SOLN COMPARISON:  CT of the abdomen and pelvis 04/11/2013. FINDINGS: CTA

## 2023-01-06 ENCOUNTER — Encounter: Payer: Self-pay | Admitting: Ophthalmology

## 2023-01-06 NOTE — Anesthesia Preprocedure Evaluation (Addendum)
Anesthesia Evaluation  Patient identified by MRN, date of birth, ID band Patient awake    Reviewed: Allergy & Precautions, H&P , NPO status , Patient's Chart, lab work & pertinent test results  Airway Mallampati: III  TM Distance: >3 FB Neck ROM: Full    Dental no notable dental hx. (+) Chipped   Pulmonary neg pulmonary ROS, sleep apnea  MRI 10-19-21 1. Aortic atherosclerosis, in addition to left anterior descending coronary artery disease. However, there is no evidence of acute aortic syndrome. 2. Very mild patchy peripheral predominant areas of ground-glass attenuation and septal thickening in the mid to lower lungs bilaterally. These findings are nonspecific, but could be indicative of early or mild interstitial lung disease. Outpatient referral to Pulmonology for further clinical evaluation is recommended. Follow-up high-resolution chest CT is recommended in 6-12 months to assess for temporal changes in the appearance of the lung parenchyma. 3. No acute findings are noted in the abdomen or pelvis. 4. Status post right radical nephrectomy. No findings to suggest local recurrence of disease or definite metastatic disease in the chest, abdomen or pelvis. 5. There are calcifications of the aortic valve and mitral annulus. Echocardiographic correlation for evaluation of potential valvular dysfunction may be warranted if clinically indicated. 6. Additional incidental findings, as above.    Pulmonary exam normal breath sounds clear to auscultation       Cardiovascular hypertension, + CAD  negative cardio ROS Normal cardiovascular exam+ Valvular Problems/Murmurs  Rhythm:Regular Rate:Normal  MRI 10-19-21 1. Aortic atherosclerosis, in addition to left anterior descending coronary artery disease. However, there is no evidence of acute aortic syndrome. 2. Very mild patchy peripheral predominant areas of ground-glass attenuation  and septal thickening in the mid to lower lungs bilaterally. These findings are nonspecific, but could be indicative of early or mild interstitial lung disease. Outpatient referral to Pulmonology for further clinical evaluation is recommended. Follow-up high-resolution chest CT is recommended in 6-12 months to assess for temporal changes in the appearance of the lung parenchyma. 3. No acute findings are noted in the abdomen or pelvis. 4. Status post right radical nephrectomy. No findings to suggest local recurrence of disease or definite metastatic disease in the chest, abdomen or pelvis. 5. There are calcifications of the aortic valve and mitral annulus. Echocardiographic correlation for evaluation of potential valvular dysfunction may be warranted if clinically indicated. 6. Additional incidental findings, as above.    Neuro/Psych  PSYCHIATRIC DISORDERS  Depression   Dementia CVA negative neurological ROS  negative psych ROS   GI/Hepatic negative GI ROS, Neg liver ROS,,,  Endo/Other  negative endocrine ROSdiabetesHypothyroidism    Renal/GU Renal diseasenegative Renal ROS  negative genitourinary   Musculoskeletal negative musculoskeletal ROS (+) Arthritis ,    Abdominal   Peds negative pediatric ROS (+)  Hematology negative hematology ROS (+)   Anesthesia Other Findings From Kylie [name] Cataract pt for 7/17.  [facility].  They asked to be notified of her arrival time  [phone], ask for Kia.  She is being brought to her procedure by her POA, [name] Cousin.  His number is [phone].  He will need to be called with the time so that he can get her here.    [name] said that he is only her financial POA, to handle things she can't get to.  He said she has some dementia, but it is mostly memory related issues.  He said she is cognizant to be able to sign her own papers authorizing the  procedure.    I explained that if she was NOT and she does not have a  Medical POA, her procedure will be cancelled.  He was sure that this would not be a problem.   Please share this info with anyone else that it might need it.   CAD Hypertension  Pulmonary embolism (HCC) Stroke  Type 2 diabetes mellitus  Solitary kidney, acquired  Renal cell carcinoma of right kidney  CKD (chronic kidney disease), stage III Sleep apnea Dementia (HCC)  Depression Hypothyroidism  Arthritis    Reproductive/Obstetrics negative OB ROS                             Anesthesia Physical Anesthesia Plan  ASA: 3  Anesthesia Plan:    Post-op Pain Management:    Induction:   PONV Risk Score and Plan:   Airway Management Planned:   Additional Equipment:   Intra-op Plan:   Post-operative Plan:   Informed Consent:   Plan Discussed with:   Anesthesia Plan Comments:         Anesthesia Quick Evaluation

## 2023-01-11 NOTE — Discharge Instructions (Signed)

## 2023-01-13 ENCOUNTER — Ambulatory Visit: Payer: Medicare (Managed Care) | Admitting: Anesthesiology

## 2023-01-13 ENCOUNTER — Ambulatory Visit
Admission: RE | Admit: 2023-01-13 | Discharge: 2023-01-13 | Disposition: A | Discharge status: Home / Self Care | Payer: Medicare (Managed Care) | Attending: Ophthalmology | Admitting: Ophthalmology

## 2023-01-13 ENCOUNTER — Encounter
Admission: RE | Disposition: A | Discharge status: Home / Self Care | Payer: Self-pay | Source: Home / Self Care | Attending: Ophthalmology

## 2023-01-13 ENCOUNTER — Other Ambulatory Visit: Payer: Self-pay

## 2023-01-13 ENCOUNTER — Encounter: Payer: Self-pay | Admitting: Ophthalmology

## 2023-01-13 DIAGNOSIS — Z8673 Personal history of transient ischemic attack (TIA), and cerebral infarction without residual deficits: Secondary | ICD-10-CM | POA: Diagnosis not present

## 2023-01-13 DIAGNOSIS — N183 Chronic kidney disease, stage 3 unspecified: Secondary | ICD-10-CM | POA: Insufficient documentation

## 2023-01-13 DIAGNOSIS — G4730 Sleep apnea, unspecified: Secondary | ICD-10-CM | POA: Diagnosis not present

## 2023-01-13 DIAGNOSIS — Z905 Acquired absence of kidney: Secondary | ICD-10-CM | POA: Diagnosis not present

## 2023-01-13 DIAGNOSIS — Z86711 Personal history of pulmonary embolism: Secondary | ICD-10-CM | POA: Insufficient documentation

## 2023-01-13 DIAGNOSIS — Z85528 Personal history of other malignant neoplasm of kidney: Secondary | ICD-10-CM | POA: Insufficient documentation

## 2023-01-13 DIAGNOSIS — E039 Hypothyroidism, unspecified: Secondary | ICD-10-CM | POA: Insufficient documentation

## 2023-01-13 DIAGNOSIS — E1136 Type 2 diabetes mellitus with diabetic cataract: Secondary | ICD-10-CM | POA: Insufficient documentation

## 2023-01-13 DIAGNOSIS — I2510 Atherosclerotic heart disease of native coronary artery without angina pectoris: Secondary | ICD-10-CM | POA: Diagnosis not present

## 2023-01-13 DIAGNOSIS — E1122 Type 2 diabetes mellitus with diabetic chronic kidney disease: Secondary | ICD-10-CM | POA: Diagnosis not present

## 2023-01-13 DIAGNOSIS — I129 Hypertensive chronic kidney disease with stage 1 through stage 4 chronic kidney disease, or unspecified chronic kidney disease: Secondary | ICD-10-CM | POA: Diagnosis not present

## 2023-01-13 DIAGNOSIS — H2589 Other age-related cataract: Secondary | ICD-10-CM | POA: Insufficient documentation

## 2023-01-13 HISTORY — DX: Unspecified dementia, unspecified severity, without behavioral disturbance, psychotic disturbance, mood disturbance, and anxiety: F03.90

## 2023-01-13 HISTORY — DX: Sleep apnea, unspecified: G47.30

## 2023-01-13 HISTORY — DX: Depression, unspecified: F32.A

## 2023-01-13 HISTORY — DX: Cerebral infarction, unspecified: I63.9

## 2023-01-13 HISTORY — DX: Hypothyroidism, unspecified: E03.9

## 2023-01-13 HISTORY — PX: CATARACT EXTRACTION W/PHACO: SHX586

## 2023-01-13 HISTORY — DX: Atherosclerotic heart disease of native coronary artery without angina pectoris: I25.10

## 2023-01-13 HISTORY — DX: Type 2 diabetes mellitus without complications: E11.9

## 2023-01-13 HISTORY — DX: Chronic kidney disease, stage 3 unspecified: N18.30

## 2023-01-13 HISTORY — DX: Unspecified osteoarthritis, unspecified site: M19.90

## 2023-01-13 SURGERY — PHACOEMULSIFICATION, CATARACT, WITH IOL INSERTION
Anesthesia: Monitor Anesthesia Care | Site: Eye | Laterality: Left

## 2023-01-13 MED ORDER — SIGHTPATH DOSE#1 NA HYALUR & NA CHOND-NA HYALUR IO KIT
PACK | INTRAOCULAR | Status: DC | PRN
  Administered 2023-01-13: 1 via OPHTHALMIC

## 2023-01-13 MED ORDER — CEFUROXIME OPHTHALMIC INJECTION 1 MG/0.1 ML
INJECTION | OPHTHALMIC | Status: DC | PRN
  Administered 2023-01-13: .1 mL via INTRACAMERAL

## 2023-01-13 MED ORDER — TRYPAN BLUE 0.06 % IO SOSY
PREFILLED_SYRINGE | INTRAOCULAR | Status: DC | PRN
  Administered 2023-01-13: .5 mL via INTRAOCULAR

## 2023-01-13 MED ORDER — LACTATED RINGERS IV SOLN: INTRAVENOUS | Status: DC

## 2023-01-13 MED ORDER — SIGHTPATH DOSE#1 BSS IO SOLN
INTRAOCULAR | Status: DC | PRN
  Administered 2023-01-13: 2 mL

## 2023-01-13 MED ORDER — ARMC OPHTHALMIC DILATING DROPS
1.0000 | OPHTHALMIC | Status: DC | PRN
  Administered 2023-01-13 (×3): 1 via OPHTHALMIC

## 2023-01-13 MED ORDER — TETRACAINE HCL 0.5 % OP SOLN
1.0000 [drp] | OPHTHALMIC | Status: DC | PRN
  Administered 2023-01-13 (×3): 1 [drp] via OPHTHALMIC

## 2023-01-13 MED ORDER — SIGHTPATH DOSE#1 BSS IO SOLN
INTRAOCULAR | Status: DC | PRN
  Administered 2023-01-13: 67 mL via OPHTHALMIC

## 2023-01-13 MED ORDER — BRIMONIDINE TARTRATE-TIMOLOL 0.2-0.5 % OP SOLN
OPHTHALMIC | Status: DC | PRN
  Administered 2023-01-13: 1 [drp] via OPHTHALMIC

## 2023-01-13 MED ORDER — SIGHTPATH DOSE#1 BSS IO SOLN
INTRAOCULAR | Status: DC | PRN
  Administered 2023-01-13: 15 mL via INTRAOCULAR

## 2023-01-13 MED ORDER — SODIUM HYALURONATE 23MG/ML IO SOSY
PREFILLED_SYRINGE | INTRAOCULAR | Status: DC | PRN
  Administered 2023-01-13: .6 mL via INTRAOCULAR

## 2023-01-13 MED ORDER — MIDAZOLAM HCL 2 MG/2ML IJ SOLN
INTRAMUSCULAR | Status: DC | PRN
  Administered 2023-01-13: 1 mg via INTRAVENOUS

## 2023-01-13 SURGICAL SUPPLY — 18 items
CANNULA ANT/CHMB 27G (MISCELLANEOUS) IMPLANT
CANNULA ANT/CHMB 27GA (MISCELLANEOUS) ×1 IMPLANT
CATARACT SUITE SIGHTPATH (MISCELLANEOUS) ×1 IMPLANT
FEE CATARACT SUITE SIGHTPATH (MISCELLANEOUS) ×1 IMPLANT
GLOVE SRG 8 PF TXTR STRL LF DI (GLOVE) ×1 IMPLANT
GLOVE SURG ENC TEXT LTX SZ7.5 (GLOVE) ×1 IMPLANT
GLOVE SURG GAMMEX PI TX LF 7.5 (GLOVE) IMPLANT
GLOVE SURG UNDER POLY LF SZ8 (GLOVE) ×1
LENS IOL TECNIS EYHANCE 19.5 (Intraocular Lens) IMPLANT
NDL FILTER BLUNT 18X1 1/2 (NEEDLE) ×1 IMPLANT
NDL RETROBULBAR .5 NSTRL (NEEDLE) IMPLANT
NEEDLE FILTER BLUNT 18X1 1/2 (NEEDLE) ×1 IMPLANT
PACK VIT ANT 23G (MISCELLANEOUS) IMPLANT
RING MALYGIN 7.0 (MISCELLANEOUS) IMPLANT
SUT ETHILON 10-0 CS-B-6CS-B-6 (SUTURE)
SUT VICRYL 9 0 (SUTURE) IMPLANT
SUTURE EHLN 10-0 CS-B-6CS-B-6 (SUTURE) IMPLANT
SYR 3ML LL SCALE MARK (SYRINGE) ×1 IMPLANT

## 2023-01-13 NOTE — H&P (Signed)
[facility]   Primary Care Physician:  [facility] Ophthalmologist: Dr. Lockie Mola  Pre-Procedure History & Physical: HPI:  Kylie [name] is a 67 y.o. female here for ophthalmic surgery.   Past Medical History:  Diagnosis Date   Arthritis    CAD in native artery    CKD (chronic kidney disease), stage III (HCC)    Dementia (HCC)    Depression    Hypertension    Hypothyroidism    Pulmonary embolism (HCC) 2014   Renal cell carcinoma of right kidney (HCC) 2009   Sleep apnea    Solitary kidney, acquired 2009   right kidney removed   Stroke [facility])    Type 2 diabetes mellitus (HCC)    no current medications    Past Surgical History:  Procedure Laterality Date   NEPHRECTOMY Right 2009   Rt kidney removal Right     Prior to Admission medications   Medication Sig Start Date End Date Taking? Authorizing Provider  acetaminophen (TYLENOL) 650 MG CR tablet Take 1,300 mg by mouth in the morning and at bedtime.   Yes [provider]  amLODipine (NORVASC) 10 MG tablet Take 10 mg by mouth daily.   Yes [provider]  Carboxymethylcellul-Glycerin (REFRESH OPTIVE OP) Place 1 drop into both eyes in the morning and at bedtime.   Yes [provider]  cloNIDine (CATAPRES-TTS-2) 0.2 mg/24hr patch Place 0.2 mg onto the skin once a week. Wednesdays   Yes [provider]  levothyroxine (SYNTHROID, LEVOTHROID) 100 MCG tablet TAKE 1 TABLET BY MOUTH EVERY MORNING 09/23/16  Yes [provider]  lidocaine (XYLOCAINE) 4 % external solution Apply topically as needed (both knees).   Yes [provider]  losartan (COZAAR) 100 MG tablet Take 100 mg by mouth daily.   Yes [provider]  meloxicam (MOBIC) 7.5 MG tablet Take 7.5 mg by mouth daily.   Yes [provider]  OLANZapine (ZYPREXA) 10 MG tablet Take 10 mg by mouth daily.   Yes [provider]  rivaroxaban (XARELTO) 10 MG TABS tablet Take 10 mg  by mouth daily.   Yes [provider]  rosuvastatin (CRESTOR) 5 MG tablet Take 5 mg by mouth at bedtime.   Yes [provider]  trolamine salicylate (ASPERCREME) 10 % cream Apply 1 Application topically 3 (three) times daily as needed for muscle pain (knee pain).   Yes [provider]  vitamin D3 (CHOLECALCIFEROL) 25 MCG tablet Take 1,000 Units by mouth daily.   Yes [provider]  EPINEPHrine 0.3 mg/0.3 mL IJ SOAJ injection Inject 0.3 mg into the muscle. Patient not taking: Reported on 01/06/2023 11/24/16   [provider]    Allergies as of 12/16/2022 - Review Complete 10/19/2021  Allergen Reaction Noted   Aspirin  10/23/2014   Bee venom  11/13/2013   Lisinopril  05/30/2014   Metformin Other (See Comments) 05/11/2017   Wasp venom protein  11/13/2013    History reviewed. No pertinent family history.  Social History   Socioeconomic History   Marital status: Single    Spouse name: Not on file   Number of children: Not on file   Years of education: Not on file   Highest education level: Not on file  Occupational History   Not on file  Tobacco Use   Smoking status: Never   Smokeless tobacco: Never  Vaping Use   Vaping status: Never Used  Substance and Sexual Activity   Alcohol use: Never  Drug use: Never   Sexual activity: Not Currently  Other Topics Concern   Not on file  Social History Narrative   Not on file   Social Determinants of Health   Financial Resource Strain: Low Risk  (09/05/2020)   Received from [facility], [facility]   Overall Financial Resource Strain (CARDIA)    Difficulty of Paying Living Expenses: Not very hard  Food Insecurity: No Food Insecurity (09/05/2020)   Received from [facility], [facility]   Hunger Vital Sign    Worried About Running Out of Food in the Last Year: Never true    Ran Out of Food in the Last Year: Never true  Transportation Needs: Unmet Transportation Needs  (01/21/2021)   Received from [facility], [facility]   [facility] - Transportation    Lack of Transportation (Medical): Yes    Lack of Transportation (Non-Medical): Yes  Physical Activity: Inactive (03/04/2021)   Received from [facility], [facility]   Exercise Vital Sign    Days of Exercise per Week: 0 days    Minutes of Exercise per Session: 0 min  Stress: Not on file  Social Connections: Not on file  Intimate Partner Violence: Not At Risk (03/04/2021)   Received from [facility], [facility]   Humiliation, Afraid, Rape, and Kick questionnaire    Fear of Current or Ex-Partner: No    Emotionally Abused: No    Physically Abused: No    Sexually Abused: No    Review of Systems: See HPI, otherwise negative ROS  Physical Exam: There were no vitals taken for this visit. General:   Alert,  pleasant and cooperative in NAD Head:  Normocephalic and atraumatic. Lungs:  Clear to auscultation.    Heart:  Regular rate and rhythm.   Impression/Plan: Kylie [name] is here for ophthalmic surgery.  Risks, benefits, limitations, and alternatives regarding ophthalmic surgery have been reviewed with the patient.  Questions have been answered.  All parties agreeable.   Lockie Mola, MD  01/13/2023, 8:36 AM

## 2023-01-13 NOTE — Anesthesia Postprocedure Evaluation (Signed)
Anesthesia Post Note  Patient: Kylie [name]  Procedure(s) Performed: CATARACT EXTRACTION PHACO AND INTRAOCULAR LENS PLACEMENT (IOC) LEFT VISION BLUE HEALON 5 13.28 01:06.8 (Left: Eye)  Patient location during evaluation: PACU Anesthesia Type: MAC Level of consciousness: awake and alert Pain management: pain level controlled Vital Signs Assessment: post-procedure vital signs reviewed and stable Respiratory status: spontaneous breathing, nonlabored ventilation, respiratory function stable and patient connected to nasal cannula oxygen Cardiovascular status: stable and blood pressure returned to baseline Postop Assessment: no apparent nausea or vomiting Anesthetic complications: no   No notable events documented.   Last Vitals:  Vitals:   01/13/23 1018 01/13/23 1022  BP: (!) 142/74 (!) 149/96  Pulse: (!) 57 63  Resp: 14 13  Temp:    SpO2: 99%     Last Pain:  Vitals:   01/13/23 1018  TempSrc:   PainSc: 0-No pain                 Erleen Egner C Maniah Nading

## 2023-01-13 NOTE — Op Note (Signed)
OPERATIVE NOTE  Kylie [name] [id number] 01/13/2023   PREOPERATIVE DIAGNOSIS:  H25.89 Cataract            Mature (Total) Cataract Left Eye H25.89   POSTOPERATIVE DIAGNOSIS: same          PROCEDURE:  Phacoemusification with posterior chamber intraocular lens placement of the left eye .  Vision Blue dye was used to stain the lens capsule.  LENS:   Implant Name Type Inv. Item Serial No. Manufacturer Lot No. LRB No. Used Action  LENS IOL TECNIS EYHANCE 19.5 - [id number] Intraocular Lens LENS IOL TECNIS EYHANCE 19.5 [id number] [employer]  Left 1 Implanted       ULTRASOUND TIME:  1 minutes 7 seconds, CDE 13.3  SURGEON:  Deirdre Evener, MD   ANESTHESIA:  Topical with tetracaine drops augmented by 1% intracameral preservative-free Lidocaine   COMPLICATIONS:  None.   DESCRIPTION OF PROCEDURE: The patient was identified in the holding room and transported to the operating room and placed in the supine position under the operating microscope.  The left eye was identified as the operative eye and it was prepped and draped in the usual sterile ophthalmic fashion.  A 1 millimeter clear-corneal paracentesis was made at the 1:30 position.  0.5 ml of preservative-free 1% lidocaine was injected into the anterior chamber. The anterior chamber was filled with Healon 5 viscoelastic.   Vision Blue dye was then injected under the viscoelastic to stain the lens capsule.  BSS was then used to wash the dye out.  Additional Healon 5 was placed into the anterior chamber. A 2.4 millimeter keratome was used to make a near-clear corneal incision at the 10:30 position. A curvilinear capsulorrhexis was made with a cystotome and capsulorrhexis forceps.  Balanced salt solution was used to hydrodissect and hydrodelineate the nucleus.  Viscoat was then placed in the anterior chamber.   Phacoemulsification was then used in stop and chop fashion to remove the lens nucleus and epinucleus.  The remaining cortex was  then removed using the irrigation and aspiration handpiece. Provisc was then placed into the capsular bag to distend it for lens placement.  A 19.5 -diopter lens was then injected into the capsular bag.  The remaining viscoelastic was aspirated.   Wounds were hydrated with balanced salt solution.  The anterior chamber was inflated to a physiologic pressure with balanced salt solution. Cefuroxime 0.1 ml of a 10mg /ml solution was injected into the anterior chamber for a dose of 1 mg of intracameral antibiotic at the completion of the case.  No wound leaks were noted.  Topical Timolol and Brimonidine drops were applied to the eye.  The patient was taken to the recovery room in stable condition without complications of anesthesia or surgery.  Adlene Adduci 01/13/2023, 10:14 AM

## 2023-01-13 NOTE — Transfer of Care (Signed)
Immediate Anesthesia Transfer of Care Note  Patient: Kylie [name]  Procedure(s) Performed: CATARACT EXTRACTION PHACO AND INTRAOCULAR LENS PLACEMENT (IOC) LEFT VISION BLUE HEALON 5 13.28 01:06.8 (Left: Eye)  Patient Location: PACU  Anesthesia Type: MAC  Level of Consciousness: awake, alert  and patient cooperative  Airway and Oxygen Therapy: Patient Spontanous Breathing and Patient connected to supplemental oxygen  Post-op Assessment: Post-op Vital signs reviewed, Patient's Cardiovascular Status Stable, Respiratory Function Stable, Patent Airway and No signs of Nausea or vomiting  Post-op Vital Signs: Reviewed and stable  Complications: No notable events documented.

## 2023-01-14 ENCOUNTER — Encounter: Payer: Self-pay | Admitting: Ophthalmology

## 2023-08-02 ENCOUNTER — Emergency Department: Payer: Medicare (Managed Care)

## 2023-08-02 ENCOUNTER — Inpatient Hospital Stay
Admission: EM | Admit: 2023-08-02 | Discharge: 2023-08-05 | DRG: 566 | Disposition: A | Discharge status: Skilled Nursing Facility | Payer: Medicare (Managed Care) | Attending: Internal Medicine | Admitting: Internal Medicine

## 2023-08-02 ENCOUNTER — Other Ambulatory Visit: Payer: Self-pay

## 2023-08-02 DIAGNOSIS — Z961 Presence of intraocular lens: Secondary | ICD-10-CM | POA: Diagnosis present

## 2023-08-02 DIAGNOSIS — I69320 Aphasia following cerebral infarction: Secondary | ICD-10-CM

## 2023-08-02 DIAGNOSIS — Z91030 Bee allergy status: Secondary | ICD-10-CM

## 2023-08-02 DIAGNOSIS — E785 Hyperlipidemia, unspecified: Secondary | ICD-10-CM | POA: Diagnosis present

## 2023-08-02 DIAGNOSIS — Z79890 Hormone replacement therapy (postmenopausal): Secondary | ICD-10-CM

## 2023-08-02 DIAGNOSIS — I10 Essential (primary) hypertension: Secondary | ICD-10-CM | POA: Diagnosis present

## 2023-08-02 DIAGNOSIS — Z85528 Personal history of other malignant neoplasm of kidney: Secondary | ICD-10-CM

## 2023-08-02 DIAGNOSIS — Z79899 Other long term (current) drug therapy: Secondary | ICD-10-CM

## 2023-08-02 DIAGNOSIS — Z905 Acquired absence of kidney: Secondary | ICD-10-CM

## 2023-08-02 DIAGNOSIS — I2510 Atherosclerotic heart disease of native coronary artery without angina pectoris: Secondary | ICD-10-CM | POA: Diagnosis present

## 2023-08-02 DIAGNOSIS — Z791 Long term (current) use of non-steroidal anti-inflammatories (NSAID): Secondary | ICD-10-CM

## 2023-08-02 DIAGNOSIS — Z8673 Personal history of transient ischemic attack (TIA), and cerebral infarction without residual deficits: Secondary | ICD-10-CM

## 2023-08-02 DIAGNOSIS — F0390 Unspecified dementia without behavioral disturbance: Secondary | ICD-10-CM | POA: Insufficient documentation

## 2023-08-02 DIAGNOSIS — M6282 Rhabdomyolysis: Principal | ICD-10-CM | POA: Diagnosis present

## 2023-08-02 DIAGNOSIS — E039 Hypothyroidism, unspecified: Secondary | ICD-10-CM | POA: Diagnosis present

## 2023-08-02 DIAGNOSIS — E876 Hypokalemia: Secondary | ICD-10-CM | POA: Diagnosis not present

## 2023-08-02 DIAGNOSIS — Z86711 Personal history of pulmonary embolism: Secondary | ICD-10-CM | POA: Insufficient documentation

## 2023-08-02 DIAGNOSIS — Z888 Allergy status to other drugs, medicaments and biological substances status: Secondary | ICD-10-CM

## 2023-08-02 DIAGNOSIS — G4730 Sleep apnea, unspecified: Secondary | ICD-10-CM | POA: Diagnosis present

## 2023-08-02 DIAGNOSIS — E11649 Type 2 diabetes mellitus with hypoglycemia without coma: Secondary | ICD-10-CM | POA: Diagnosis present

## 2023-08-02 DIAGNOSIS — Z9842 Cataract extraction status, left eye: Secondary | ICD-10-CM

## 2023-08-02 DIAGNOSIS — E162 Hypoglycemia, unspecified: Secondary | ICD-10-CM | POA: Insufficient documentation

## 2023-08-02 DIAGNOSIS — Z7901 Long term (current) use of anticoagulants: Secondary | ICD-10-CM

## 2023-08-02 LAB — COMPREHENSIVE METABOLIC PANEL
ALT: 44 U/L (ref 0–44)
AST: 74 U/L — ABNORMAL HIGH (ref 15–41)
Albumin: 3.5 g/dL (ref 3.5–5.0)
Alkaline Phosphatase: 49 U/L (ref 38–126)
Anion gap: 15 (ref 5–15)
BUN: 42 mg/dL — ABNORMAL HIGH (ref 8–23)
CO2: 24 mmol/L (ref 22–32)
Calcium: 9.6 mg/dL (ref 8.9–10.3)
Chloride: 102 mmol/L (ref 98–111)
Creatinine, Ser: 1.18 mg/dL — ABNORMAL HIGH (ref 0.44–1.00)
GFR, Estimated: 50 mL/min — ABNORMAL LOW (ref >60)
Glucose, Bld: 93 mg/dL (ref 70–99)
Potassium: 3.9 mmol/L (ref 3.5–5.1)
Sodium: 141 mmol/L (ref 135–145)
Total Bilirubin: 0.8 mg/dL (ref 0.0–1.2)
Total Protein: 6.8 g/dL (ref 6.5–8.1)

## 2023-08-02 LAB — CBG MONITORING, ED
Glucose-Capillary: 116 mg/dL — ABNORMAL HIGH (ref 70–99)
Glucose-Capillary: 45 mg/dL — ABNORMAL LOW (ref 70–99)
Glucose-Capillary: 162 mg/dL — ABNORMAL HIGH (ref 70–99)

## 2023-08-02 LAB — CBC
HCT: 38.4 % (ref 36.0–46.0)
Hemoglobin: 12.6 g/dL (ref 12.0–15.0)
MCH: 31.9 pg (ref 26.0–34.0)
MCHC: 32.8 g/dL (ref 30.0–36.0)
MCV: 97.2 fL (ref 80.0–100.0)
Platelets: 270 10*3/uL (ref 150–400)
RBC: 3.95 MIL/uL (ref 3.87–5.11)
RDW: 12.5 % (ref 11.5–15.5)
WBC: 8.8 10*3/uL (ref 4.0–10.5)
nRBC: 0 % (ref 0.0–0.2)

## 2023-08-02 LAB — CK: Total CK: 2158 U/L — ABNORMAL HIGH (ref 38–234)

## 2023-08-02 MED ORDER — LACTATED RINGERS IV BOLUS
1000.0000 mL | Freq: Once | INTRAVENOUS | Status: AC
  Administered 2023-08-02: 1000 mL via INTRAVENOUS

## 2023-08-02 MED ORDER — DEXTROSE 50 % IV SOLN
INTRAVENOUS | Status: AC
  Filled 2023-08-02: qty 50

## 2023-08-02 NOTE — ED Triage Notes (Signed)
Patient bib EMS, per report, patient found of floor, unknown downtime.  Incontinent of urine and stool.  Patient is AAOx2, confused to time.  Unable to states what happened at home today, only complaint is right arm pain.

## 2023-08-02 NOTE — ED Notes (Signed)
Pt given of orange juice at this time as well as a [place] sandwich tray. Pt eating and drinking without any difficulties.

## 2023-08-02 NOTE — ED Provider Notes (Signed)
[facility] Provider Note    Event Date/Time   First MD Initiated Contact with Patient 08/02/23 2058     (approximate)   History   Weakness   HPI  Kylie [name] is a 68 y.o. female who presents to the ED for evaluation of Weakness   Review of PCP visit from 1.5 years ago.  Nothing more recent.  History of RCC s/p right nephrectomy in 2009, HTN, HLD and dementia.  Patient is nonverbal at baseline and unable to provide any relevant history.  All history is provided by her cousin and POA who is at the bedside, Dulce Sellar.  As well as the pace physician on-call Dr. Baxter Flattery.  Patient was found down earlier today   Physical Exam   Triage Vital Signs: ED Triage Vitals  Encounter Vitals Group     BP 08/02/23 2049 132/67     Systolic BP Percentile --      Diastolic BP Percentile --      Pulse Rate 08/02/23 2049 78     Resp 08/02/23 2049 16     Temp 08/02/23 2049 98.5 F (36.9 C)     Temp Source 08/02/23 2049 Oral     SpO2 08/02/23 2049 100 %     Weight 08/02/23 1810 183 lb 6.8 oz (83.2 kg)     Height --      Head Circumference --      Peak Flow --      Pain Score --      Pain Loc --      Pain Education --      Exclude from Growth Chart --     Most recent vital signs: Vitals:   08/02/23 2049  BP: 132/67  Pulse: 78  Resp: 16  Temp: 98.5 F (36.9 C)  SpO2: 100%    General:  no distress.  CV:  Good peripheral perfusion.  Resp:  Normal effort.  Abd:  No distention.  MSK:  No deformity noted.  Neuro:  No focal deficits appreciated. Other:     ED Results / Procedures / Treatments   Labs (all labs ordered are listed, but only abnormal results are displayed) Labs Reviewed  COMPREHENSIVE METABOLIC PANEL - Abnormal; Notable for the following components:      Result Value   BUN 42 (*)    Creatinine, Ser 1.18 (*)    AST 74 (*)    GFR, Estimated 50 (*)    All other components within normal limits  CK - Abnormal; Notable for the following  components:   Total CK 2,158 (*)    All other components within normal limits  CBG MONITORING, ED - Abnormal; Notable for the following components:   Glucose-Capillary 45 (*)    All other components within normal limits  CBG MONITORING, ED - Abnormal; Notable for the following components:   Glucose-Capillary 116 (*)    All other components within normal limits  CBG MONITORING, ED - Abnormal; Notable for the following components:   Glucose-Capillary 162 (*)    All other components within normal limits  CBC  URINALYSIS, ROUTINE W REFLEX MICROSCOPIC    EKG Terrible quality EKG with tremulous baseline.  Narrow complex rhythm that seems to be sinus with a rate of 94 bpm.  Signs of LVH.  No STEMI.  RADIOLOGY CXR interpreted by me without evidence of acute cardiopulmonary pathology. CT head interpreted by me without evidence of acute intracranial pathology CT cervical spine interpreted by me without evidence  of fracture or dislocation  Official radiology report(s): DG Chest Portable 1 View Result Date: 08/02/2023 CLINICAL DATA:  Fall.  Right arm pain. EXAM: PORTABLE CHEST 1 VIEW COMPARISON:  CT chest 10/19/2021 FINDINGS: Normal heart size and pulmonary vascularity. No focal airspace disease or consolidation in the lungs. No blunting of costophrenic angles. No pneumothorax. Mediastinal contours appear intact. Degenerative changes in the spine and shoulders. Surgical clips in the right upper quadrant. IMPRESSION: No active disease. Electronically Signed   By: Burman Nieves M.D.   On: 08/02/2023 22:07   CT Cervical Spine Wo Contrast Result Date: 08/02/2023 CLINICAL DATA:  Patient was found down on the floor for an unknown amount of time. Fall. Incontinence of urine and stool. EXAM: CT CERVICAL SPINE WITHOUT CONTRAST TECHNIQUE: Multidetector CT imaging of the cervical spine was performed without intravenous contrast. Multiplanar CT image reconstructions were also generated. RADIATION DOSE  REDUCTION: This exam was performed according to the departmental dose-optimization program which includes automated exposure control, adjustment of the mA and/or kV according to patient size and/or use of iterative reconstruction technique. COMPARISON:  None Available. FINDINGS: Alignment: Reversal of the usual cervical lordosis without anterior subluxation. Normal alignment of the facet joints. Alignment is likely due to patient positioning and degenerative change but muscle spasm can have this appearance. Skull base and vertebrae: Skull base appears intact. No vertebral compression deformities. No focal bone lesion or bone destruction. Bone cortex appears intact. Soft tissues and spinal canal: No prevertebral soft tissue swelling. No abnormal paraspinal soft tissue mass or infiltration. Vascular calcifications. Disc levels: Degenerative changes with disc space narrowing and endplate osteophyte throughout the cervical spine. Prominent anterior and posterior osteophytes. Posterior disc osteophyte complexes cause some encroachment on the anterior thecal sac at C3-4, C4-5, and C5-6 levels. Uncovertebral and facet joint spurring causes bone encroachment upon several neural foramina bilaterally. Upper chest: Lung apices are clear. Other: None. IMPRESSION: 1. Nonspecific reversal of the usual cervical lordosis. No acute displaced fractures identified. 2. Severe multilevel degenerative changes. Electronically Signed   By: Burman Nieves M.D.   On: 08/02/2023 22:07   CT HEAD WO CONTRAST ( ) Result Date: 08/02/2023 CLINICAL DATA:  Found down. On Xarelto. Unknown down time. Incontinence of urine and stool. EXAM: CT HEAD WITHOUT CONTRAST TECHNIQUE: Contiguous axial images were obtained from the base of the skull through the vertex without intravenous contrast. RADIATION DOSE REDUCTION: This exam was performed according to the departmental dose-optimization program which includes automated exposure control, adjustment of  the mA and/or kV according to patient size and/or use of iterative reconstruction technique. COMPARISON:  None Available. FINDINGS: Brain: Diffuse cerebral atrophy. Ventricular dilatation consistent with central atrophy. Low-attenuation changes in the deep white matter consistent with small vessel ischemia. No abnormal extra-axial fluid collections. No mass effect or midline shift. Gray-white matter junctions are distinct. Basal cisterns are not effaced. No acute intracranial hemorrhage. Vascular: No hyperdense vessel or unexpected calcification. Skull: Normal. Negative for fracture or focal lesion. Sinuses/Orbits: No acute finding. Other: None. IMPRESSION: No acute intracranial abnormalities. Chronic atrophy and small vessel ischemic changes. Electronically Signed   By: Burman Nieves M.D.   On: 08/02/2023 22:03    PROCEDURES and INTERVENTIONS:  .Critical Care  Performed by: Delton Prairie, MD Authorized by: Delton Prairie, MD   Critical care provider statement:    Critical care time (minutes):  30   Critical care time was exclusive of:  Separately billable procedures and treating other patients   Critical care was necessary to treat  or prevent imminent or life-threatening deterioration of the following conditions:  Metabolic crisis and endocrine crisis   Critical care was time spent personally by me on the following activities:  Development of treatment plan with patient or surrogate, discussions with consultants, evaluation of patient's response to treatment, examination of patient, ordering and review of laboratory studies, ordering and review of radiographic studies, ordering and performing treatments and interventions, pulse oximetry, re-evaluation of patient's condition and review of old charts .1-3 Lead EKG Interpretation  Performed by: Delton Prairie, MD Authorized by: Delton Prairie, MD     Interpretation: normal     ECG rate:  80   ECG rate assessment: normal     Rhythm: sinus rhythm      Ectopy: none     Conduction: normal     Medications  lactated ringers bolus 1,000 mL (has no administration in time range)  dextrose 50 % solution (  Given 08/02/23 2049)     IMPRESSION / MDM / ASSESSMENT AND PLAN / ED COURSE  I reviewed the triage vital signs and the nursing notes.  Differential diagnosis includes, but is not limited to, sepsis or UTI or pneumonia.  ICH or skull fracture.  Rhabdomyolysis, AKI  {Patient presents with symptoms of an acute illness or injury that is potentially life-threatening.  Patient presents from home after being found down with evidence of rhabdomyolysis.  While waiting, family reports altered mentation acutely and she was noted to be hypoglycemic requiring an amp of D50 with improvement of her mental status to baseline dementia.  CKD near baseline, normal CBC.  Awaiting UA prior to admission for her rhabdo and hypoglycemia.  Imaging is reassuring.  Clinical Course as of 08/02/23 2245  Mon Aug 02, 2023  2112 Improved glucose after D50 [DS]  2115 Mauro Kaufmann with [facility]. Was found down. Severe dementia, lives alone. Cousin Engineer, petroleum) makes decisions for her. Terrible arthritis. Paranoia normally. On Xarelto. Has had PE and CVA. Cousin last saw her yesterday, don't know when she went down.  [DS]  2239 Reassessed and discussed with her POA/cousin [name] at the bedside.  He is agreeable plan of care for admission after urinalysis for rhabdomyolysis, hyperglycemia. [DS]    Clinical Course User Index [DS] Delton Prairie, MD     FINAL CLINICAL IMPRESSION(S) / ED DIAGNOSES   Final diagnoses:  Non-traumatic rhabdomyolysis  Hypoglycemia     Rx / DC Orders   ED Discharge Orders     None        Note:  This document was prepared using Dragon voice recognition software and may include unintentional dictation errors.   Delton Prairie, MD 08/02/23 2245

## 2023-08-02 NOTE — ED Notes (Signed)
Patient taken to decon room and bath given. Patient placed in bed, clean linen and chucks and to triage.

## 2023-08-02 NOTE — ED Notes (Signed)
Pt sugar checked by this EDT. CBG resulted 45. Pt given orange juice and sandwich tray to help bring sugar up.

## 2023-08-03 ENCOUNTER — Encounter: Payer: Self-pay | Admitting: Internal Medicine

## 2023-08-03 DIAGNOSIS — T796XXA Traumatic ischemia of muscle, initial encounter: Secondary | ICD-10-CM

## 2023-08-03 DIAGNOSIS — E039 Hypothyroidism, unspecified: Secondary | ICD-10-CM | POA: Diagnosis present

## 2023-08-03 DIAGNOSIS — Z9842 Cataract extraction status, left eye: Secondary | ICD-10-CM | POA: Diagnosis not present

## 2023-08-03 DIAGNOSIS — Z79899 Other long term (current) drug therapy: Secondary | ICD-10-CM | POA: Diagnosis not present

## 2023-08-03 DIAGNOSIS — Z905 Acquired absence of kidney: Secondary | ICD-10-CM

## 2023-08-03 DIAGNOSIS — Z86711 Personal history of pulmonary embolism: Secondary | ICD-10-CM | POA: Insufficient documentation

## 2023-08-03 DIAGNOSIS — Z961 Presence of intraocular lens: Secondary | ICD-10-CM | POA: Diagnosis present

## 2023-08-03 DIAGNOSIS — Z7901 Long term (current) use of anticoagulants: Secondary | ICD-10-CM | POA: Diagnosis not present

## 2023-08-03 DIAGNOSIS — Z85528 Personal history of other malignant neoplasm of kidney: Secondary | ICD-10-CM | POA: Diagnosis not present

## 2023-08-03 DIAGNOSIS — E876 Hypokalemia: Secondary | ICD-10-CM | POA: Diagnosis not present

## 2023-08-03 DIAGNOSIS — Z888 Allergy status to other drugs, medicaments and biological substances status: Secondary | ICD-10-CM | POA: Diagnosis not present

## 2023-08-03 DIAGNOSIS — I10 Essential (primary) hypertension: Secondary | ICD-10-CM

## 2023-08-03 DIAGNOSIS — M6282 Rhabdomyolysis: Secondary | ICD-10-CM | POA: Diagnosis present

## 2023-08-03 DIAGNOSIS — G4730 Sleep apnea, unspecified: Secondary | ICD-10-CM | POA: Diagnosis present

## 2023-08-03 DIAGNOSIS — Z91030 Bee allergy status: Secondary | ICD-10-CM | POA: Diagnosis not present

## 2023-08-03 DIAGNOSIS — F0390 Unspecified dementia without behavioral disturbance: Secondary | ICD-10-CM | POA: Diagnosis present

## 2023-08-03 DIAGNOSIS — E162 Hypoglycemia, unspecified: Secondary | ICD-10-CM | POA: Insufficient documentation

## 2023-08-03 DIAGNOSIS — I2510 Atherosclerotic heart disease of native coronary artery without angina pectoris: Secondary | ICD-10-CM | POA: Diagnosis present

## 2023-08-03 DIAGNOSIS — Z79890 Hormone replacement therapy (postmenopausal): Secondary | ICD-10-CM | POA: Diagnosis not present

## 2023-08-03 DIAGNOSIS — E785 Hyperlipidemia, unspecified: Secondary | ICD-10-CM | POA: Diagnosis present

## 2023-08-03 DIAGNOSIS — Z8673 Personal history of transient ischemic attack (TIA), and cerebral infarction without residual deficits: Secondary | ICD-10-CM

## 2023-08-03 DIAGNOSIS — Z791 Long term (current) use of non-steroidal anti-inflammatories (NSAID): Secondary | ICD-10-CM | POA: Diagnosis not present

## 2023-08-03 DIAGNOSIS — I69320 Aphasia following cerebral infarction: Secondary | ICD-10-CM | POA: Diagnosis not present

## 2023-08-03 DIAGNOSIS — E11649 Type 2 diabetes mellitus with hypoglycemia without coma: Secondary | ICD-10-CM

## 2023-08-03 LAB — URINALYSIS, ROUTINE W REFLEX MICROSCOPIC
Bilirubin Urine: NEGATIVE
Glucose, UA: NEGATIVE mg/dL
Hgb urine dipstick: NEGATIVE
Ketones, ur: 20 mg/dL — AB
Leukocytes,Ua: NEGATIVE
Nitrite: NEGATIVE
Protein, ur: 30 mg/dL — AB
Specific Gravity, Urine: 1.018 (ref 1.005–1.030)
pH: 5 (ref 5.0–8.0)

## 2023-08-03 LAB — HEMOGLOBIN A1C
Hgb A1c MFr Bld: 5.4 % (ref 4.8–5.6)
Mean Plasma Glucose: 108.28 mg/dL

## 2023-08-03 LAB — CBG MONITORING, ED
Glucose-Capillary: 104 mg/dL — ABNORMAL HIGH (ref 70–99)
Glucose-Capillary: 109 mg/dL — ABNORMAL HIGH (ref 70–99)
Glucose-Capillary: 139 mg/dL — ABNORMAL HIGH (ref 70–99)
Glucose-Capillary: 154 mg/dL — ABNORMAL HIGH (ref 70–99)
Glucose-Capillary: 90 mg/dL (ref 70–99)
Glucose-Capillary: 97 mg/dL (ref 70–99)

## 2023-08-03 LAB — TSH: TSH: 3.33 u[IU]/mL (ref 0.350–4.500)

## 2023-08-03 LAB — HIV ANTIBODY (ROUTINE TESTING W REFLEX): HIV Screen 4th Generation wRfx: NONREACTIVE

## 2023-08-03 MED ORDER — LEVOTHYROXINE SODIUM 100 MCG PO TABS
100.0000 ug | ORAL_TABLET | Freq: Every morning | ORAL | Status: DC
  Administered 2023-08-04 – 2023-08-05 (×2): 100 ug via ORAL
  Filled 2023-08-03: qty 2
  Filled 2023-08-03: qty 1

## 2023-08-03 MED ORDER — ONDANSETRON HCL 4 MG PO TABS: 4.0000 mg | ORAL_TABLET | Freq: Four times a day (QID) | ORAL | Status: DC | PRN

## 2023-08-03 MED ORDER — ACETAMINOPHEN 650 MG RE SUPP: 650.0000 mg | Freq: Four times a day (QID) | RECTAL | Status: DC | PRN

## 2023-08-03 MED ORDER — HALOPERIDOL LACTATE 5 MG/ML IJ SOLN
1.0000 mg | Freq: Four times a day (QID) | INTRAMUSCULAR | Status: DC | PRN
Start: 2023-08-03 — End: 2023-08-05

## 2023-08-03 MED ORDER — DEXTROSE IN LACTATED RINGERS 5 % IV SOLN: INTRAVENOUS | Status: AC

## 2023-08-03 MED ORDER — ACETAMINOPHEN 325 MG PO TABS: 650.0000 mg | ORAL_TABLET | Freq: Four times a day (QID) | ORAL | Status: DC | PRN

## 2023-08-03 MED ORDER — ONDANSETRON HCL 4 MG/2ML IJ SOLN: 4.0000 mg | Freq: Four times a day (QID) | INTRAMUSCULAR | Status: DC | PRN

## 2023-08-03 MED ORDER — RIVAROXABAN 10 MG PO TABS
10.0000 mg | ORAL_TABLET | Freq: Every day | ORAL | Status: DC
  Administered 2023-08-03 – 2023-08-05 (×3): 10 mg via ORAL
  Filled 2023-08-03 (×3): qty 1

## 2023-08-03 MED ORDER — OLANZAPINE 10 MG PO TABS
10.0000 mg | ORAL_TABLET | Freq: Every day | ORAL | Status: DC
  Administered 2023-08-03 – 2023-08-05 (×3): 10 mg via ORAL
  Filled 2023-08-03 (×3): qty 1

## 2023-08-03 MED ORDER — DEXTROSE-SODIUM CHLORIDE 5-0.45 % IV SOLN
1000.0000 mL | Freq: Once | INTRAVENOUS | Status: AC
  Administered 2023-08-03: 1000 mL via INTRAVENOUS

## 2023-08-03 NOTE — Hospital Course (Addendum)
 Taken from H&P.  Kylie [name] is a 68 y.o. female with medical history significant for prior renal cell carcinoma s/p R nephrectomy (12/2007), HTN, HLD, and dementia, prior CVA with aphasia, diabetes and history of PE.  Brought to the ED after she was found down by family.  Patient lives alone and they check in on her frequently and last known well was the day prior.  Patient had 1 episode of hypoglycemia while waiting in triage and received an amp of D50.  On presentation vitals stable, CK elevated at 2158, creatinine 1.18, CBC normal and UA unremarkable. EKG mild tachycardia Chest x-ray clear CT head and C-spine without trauma Patient treated with a fluid bolus and was started on D5 half NS after receiving D50.  2/4: Vital stable, CBG within goal at 97 with D5, patient was not taking any diabetic medications at home.  A1c of 5.4.   Patient apparently discharged from a nursing facility after staying there for 1 week on a trial basis on Friday.  Per POA she has very limited mobility, unable to prepare her meal, they are trying to arrange someone who can help her with meal and ADL with the help of pace or family wants her to go to a nursing home.  Patient does not remember that she fell, otherwise oriented to self and place.  CBG now stable with IV fluid and she started eating and drinking.  Likely hypoglycemia with poor p.o. intake, also checking insulin  levels.  PT and OT evaluation ordered.  2/5: Hemodynamically stable, CBG within goal, TSH normal, insulin  levels normal at 23.2 and C-peptide mildly elevated at 9, checking proinsulin and a.m. cortisol levels.  2/6: Remained hemodynamically stable with mildly elevated blood pressure, restarting home losartan .  CK continued to improve.  Stable blood glucose level, hypokalemia with potassium at 2.9 which is being repleted.  A.m. cortisol level at 9.9 but they was checked in the early afternoon. Pending proinsulin levels.  Patient should encourage  regular meal and p.o. hydration.  Patient is being discharged to SNF for further management. She will continue on current medications and need to have a close follow-up with her PCP for further assistance.

## 2023-08-03 NOTE — Assessment & Plan Note (Signed)
Continuing Xarelto

## 2023-08-03 NOTE — ED Provider Notes (Signed)
-----------------------------------------   12:54 AM on 08/03/2023 -----------------------------------------   UA negative for infection, ketonuria noted.  Repeat blood sugar at 0012 was 139.  At this time we will consult hospital services for evaluation and admission for rhabdomyolysis and hypoglycemia.   Arliss Hepburn J, MD 08/03/23 [phone]

## 2023-08-03 NOTE — Assessment & Plan Note (Signed)
Delirium precautions 

## 2023-08-03 NOTE — ED Notes (Signed)
Cbg 76 ?

## 2023-08-03 NOTE — Assessment & Plan Note (Addendum)
 Patient was not on any diabetic medications at home.  A1c of 5.4, not sure whether she was diabetic or it has been improved. Recurrent hypoglycemia, now started improving. Likely poor p.o. intake -Checking insulin  and C-peptide -Continue D5 NS -Monitor CBG

## 2023-08-03 NOTE — Progress Notes (Signed)
 Progress Note   Patient: Kylie [name] FMW:[id number] DOB: Jul 03, 1955 DOA: 08/02/2023     0 DOS: the patient was seen and examined on 08/03/2023   Brief hospital course: Taken from H&P.  Kylie [name] is a 68 y.o. female with medical history significant for prior renal cell carcinoma s/p R nephrectomy (12/2007), HTN, HLD, and dementia, prior CVA with aphasia, diabetes and history of PE.  Brought to the ED after she was found down by family.  Patient lives alone and they check in on her frequently and last known well was the day prior.  Patient had 1 episode of hypoglycemia while waiting in triage and received an amp of D50.  On presentation vitals stable, CK elevated at 2158, creatinine 1.18, CBC normal and UA unremarkable. EKG mild tachycardia Chest x-ray clear CT head and C-spine without trauma Patient treated with a fluid bolus and was started on D5 half NS after receiving D50.  2/4: Vital stable, CBG within goal at 97 with D5, patient was not taking any diabetic medications at home.  A1c of 5.4.   Patient apparently discharged from a nursing facility after staying there for 1 week on a trial basis on Friday.  Per POA she has very limited mobility, unable to prepare her meal, they are trying to arrange someone who can help her with meal and ADL with the help of pace or family wants her to go to a nursing home.  Patient does not remember that she fell, otherwise oriented to self and place.  CBG now stable with IV fluid and she started eating and drinking.  Likely hypoglycemia with poor p.o. intake, also checking insulin  levels.  PT and OT evaluation ordered.     Assessment and Plan: * Rhabdomyolysis, traumatic Continue IV hydration Monitor CK PT eval Fall precautions  Uncontrolled type 2 diabetes mellitus with hypoglycemia, without long-term current use of insulin  (HCC) Patient was not on any diabetic medications at home.  A1c of 5.4, not sure whether she was diabetic or it has been  improved. Recurrent hypoglycemia, now started improving. Likely poor p.o. intake -Checking insulin  and C-peptide -Continue D5 NS -Monitor CBG  History of CVA (cerebrovascular accident) Continuing Xarelto   History of pulmonary embolism Chronic anticoagulation Continue Xarelto   S/p R nephrectomy for RCC No acute issues suspected  Dementia (HCC) Delirium precautions  Essential hypertension Soft blood pressures so will hold antihypertensive  Acquired hypothyroidism Continue levothyroxine    Subjective: Patient was sitting upright in bed when seen today.  Oriented to self and place.  She does not remember any fall.  Very limited mobility and unable to take care of herself.  Physical Exam: Vitals:   08/03/23 0800 08/03/23 1000 08/03/23 1015 08/03/23 1111  BP:  96/61    Pulse:  (!) 114    Resp:  13 13   Temp:    98.8 F (37.1 C)  TempSrc:    Oral  SpO2:  100%    Weight:      Height: 5' 10 (1.778 m)      General.  Frail elderly lady, in no acute distress. Pulmonary.  Lungs clear bilaterally, normal respiratory effort. CV.  Regular rate and rhythm, no JVD, rub or murmur. Abdomen.  Soft, nontender, nondistended, BS positive. CNS.  Alert and oriented x 2.  No focal neurologic deficit. Extremities.  No edema, no cyanosis, pulses intact and symmetrical.   Data Reviewed: Prior data reviewed  Family Communication: Discussed with her cousin who is also her POA, also discussed  with [facility] provider.  Disposition: Status is: Inpatient Remains inpatient appropriate because: Severity of illness  Planned Discharge Destination:  To be determined  DVT prophylaxis.  Xarelto  Time spent:  minutes  This record has been created using Conservation officer, historic buildings. Errors have been sought and corrected,but may not always be located. Such creation errors do not reflect on the standard of care.   Author: Amaryllis Dare, MD 08/03/2023 1:55 PM  For on call review [email or web address].

## 2023-08-03 NOTE — Assessment & Plan Note (Signed)
 No acute issues suspected

## 2023-08-03 NOTE — Hospital Course (Signed)
 Kylie [name]

## 2023-08-03 NOTE — Assessment & Plan Note (Addendum)
Continue IV hydration Monitor CK PT eval Fall precautions

## 2023-08-03 NOTE — TOC CM/SW Note (Signed)
 Transition of Care [facility]) - Inpatient Brief Assessment   Patient Details  Name: Kylie [name] MRN: [id number] Date of Birth: Nov 25, 1955  [facility]) CM/SW Contact:    Silvano Molt, LCSW Phone Number: 08/03/2023, 3:31 PM   Clinical Narrative:  Pt from home alone with [facility] as [facility]. CSW consulted with Dr. Joen Cypress, Geriatrician with [facility] [phone]. Dr. Cypress is recommending SNF for pt and states [facility] will then assist with LTC placement. CSW informed medical team. CSW made contact with [name], POA to discuss medical recommendations; he is in agreement. [name] reports pt was at [facility] for a week as a trial to see how pt would be in environment. CSW completed SNF w/u and faxed to the three facilities [facility] contracts with. [name] prefers [facility]. Attending reports pt can d/c tomorrow if bed is available.   Transition of Care Asessment: Insurance and Status: Insurance coverage has been reviewed Patient has primary care physician: Yes Home environment has been reviewed: Pt currently lives home alone, but POA and [facility] are looking for pt to go to SNF and then LTC   Prior/Current Home Services: Current home services ([facility]) Social Drivers of Health Review: SDOH reviewed no interventions necessary Readmission risk has been reviewed: Yes Transition of care needs: transition of care needs identified, TOC will continue to follow

## 2023-08-03 NOTE — NC FL2 (Addendum)
 [place]  MEDICAID FL2 LEVEL OF CARE FORM     IDENTIFICATION  Patient Name: Kylie [name] Birthdate: 04-Jul-1955 Sex: female Admission Date (Current Location): 08/02/2023  [facility] and [place] Number:  Chiropodist and Address:  [facility], [address], [place], [place] [address]      Provider Number: [id number]  Attending Physician Name and Address:  Caleen Qualia, MD  Relative Name and Phone Number:  [name] [phone]    Current Level of Care: Hospital Recommended Level of Care: Skilled Nursing Facility Prior Approval Number: [id number]   Date Approved/Denied:   PASRR Number:    Discharge Plan: SNF    Current Diagnoses: Patient Active Problem List   Diagnosis Date Noted   Rhabdomyolysis, traumatic 08/03/2023   Uncontrolled type 2 diabetes mellitus with hypoglycemia, without long-term current use of insulin  (HCC) 08/03/2023   S/p R nephrectomy for RCC 08/03/2023   History of pulmonary embolism 08/03/2023   History of CVA (cerebrovascular accident) 08/03/2023   Dementia (HCC)    Chronic anticoagulation 05/25/2017   CVA (cerebral vascular accident) (HCC) 05/25/2017   Hyperlipidemia 05/25/2017   Obesity (BMI 30.0-34.9) 10/22/2016   Type 2 diabetes mellitus without complication, without long-term current use of insulin  (HCC) 11/14/2014   Acquired hypothyroidism 02/19/2014   Allergic rhinitis 02/19/2014   Neuropathy 02/19/2014   Primary osteoarthritis of both knees 02/19/2014   Severe episode of recurrent major depressive disorder, without psychotic features (HCC) 02/19/2014   Vitamin B 12 deficiency 02/19/2014   Vitamin D  deficiency 02/19/2014   PE (pulmonary thromboembolism) (HCC) 10/03/2012   Malignant neoplasm of kidney (HCC) 03/21/2012   Gallstones without obstruction of gallbladder 05/08/2009   Sleep apnea 07/05/2000   Essential hypertension 12/06/1996    Orientation RESPIRATION BLADDER Height & Weight     Self,  Place  Normal Incontinent Weight: 183 lb 6.8 oz (83.2 kg) Height:  5' 10 (177.8 cm)  BEHAVIORAL SYMPTOMS/MOOD NEUROLOGICAL BOWEL NUTRITION STATUS      Incontinent Diet (Please see d/c summary)  AMBULATORY STATUS COMMUNICATION OF NEEDS Skin   Extensive Assist (Per H&P) Verbally Skin abrasions                       Personal Care Assistance Level of Assistance              Functional Limitations Info             SPECIAL CARE FACTORS FREQUENCY  PT (By licensed PT), OT (By licensed OT)     PT Frequency: 5x/wk OT Frequency: 5x/wk            Contractures Contractures Info: Not present    Additional Factors Info  Code Status Code Status Info: Full             Current Medications (08/03/2023):  This is the current hospital active medication list Current Facility-Administered Medications  Medication Dose Route Frequency Provider Last Rate Last Admin   acetaminophen  (TYLENOL ) tablet 650 mg  650 mg Oral Q6H PRN Duncan, Hazel V, MD       Or   acetaminophen  (TYLENOL ) suppository 650 mg  650 mg Rectal Q6H PRN Cleatus Delayne GAILS, MD       dextrose  5 % in lactated ringers  infusion   Intravenous Continuous Amin, Sumayya, MD 75 mL/hr at 08/03/23 1110 New Bag at 08/03/23 1110   haloperidol  lactate (HALDOL ) injection 1 mg  1 mg Intravenous Q6H PRN Cleatus Delayne GAILS, MD       [  START ON 08/04/2023] levothyroxine  (SYNTHROID ) tablet 100 mcg  100 mcg Oral q morning Amin, Sumayya, MD       OLANZapine  (ZYPREXA ) tablet 10 mg  10 mg Oral Daily Amin, Sumayya, MD       ondansetron  (ZOFRAN ) tablet 4 mg  4 mg Oral Q6H PRN Duncan, Hazel V, MD       Or   ondansetron  (ZOFRAN ) injection 4 mg  4 mg Intravenous Q6H PRN Cleatus Delayne GAILS, MD       rivaroxaban  (XARELTO ) tablet 10 mg  10 mg Oral Daily Duncan, Hazel V, MD   10 mg at 08/03/23 1002   Current Outpatient Medications  Medication Sig Dispense Refill   acetaminophen  (TYLENOL ) 650 MG CR tablet Take 1,300 mg by mouth in the morning and at  bedtime.     amLODipine  (NORVASC ) 10 MG tablet Take 10 mg by mouth daily.     Carboxymethylcellul-Glycerin (REFRESH OPTIVE OP) Place 1 drop into both eyes in the morning and at bedtime.     cloNIDine  (CATAPRES -TTS-2) 0.2 mg/24hr patch Place 0.2 mg onto the skin once a week. Wednesdays     DULoxetine  (CYMBALTA ) 30 MG capsule Take 30 mg by mouth daily.     levothyroxine  (SYNTHROID , LEVOTHROID) 100 MCG tablet TAKE 1 TABLET BY MOUTH EVERY MORNING     lidocaine  (XYLOCAINE ) 4 % external solution Apply topically as needed (both knees).     losartan  (COZAAR ) 100 MG tablet Take 100 mg by mouth daily.     meloxicam (MOBIC) 7.5 MG tablet Take 7.5 mg by mouth daily.     OLANZapine  (ZYPREXA ) 10 MG tablet Take 10 mg by mouth daily.     rivaroxaban  (XARELTO ) 10 MG TABS tablet Take 10 mg by mouth daily.     rosuvastatin  (CRESTOR ) 5 MG tablet Take 5 mg by mouth at bedtime.     trolamine salicylate (ASPERCREME) 10 % cream Apply 1 Application topically 3 (three) times daily as needed for muscle pain (knee pain).     vitamin D3 (CHOLECALCIFEROL ) 25 MCG tablet Take 1,000 Units by mouth daily.     EPINEPHrine  0.3 mg/0.3 mL IJ SOAJ injection Inject 0.3 mg into the muscle. (Patient not taking: Reported on 01/06/2023)       Discharge Medications: Please see discharge summary for a list of discharge medications.  Relevant Imaging Results:  Relevant Lab Results:   Additional Information SS#: [id number]; Silvano Molt, LCSW  Silvano Molt, LCSW

## 2023-08-03 NOTE — Assessment & Plan Note (Addendum)
Chronic anticoagulation Continue Xarelto

## 2023-08-03 NOTE — H&P (Signed)
 History and Physical    Patient: Kylie [name] FMW:[id number] DOB: 1956-05-23 DOA: 08/02/2023 DOS: the patient was seen and examined on 08/03/2023 PCP: [facility]  Patient coming from: Home  Chief Complaint:  Chief Complaint  Patient presents with   Weakness    HPI: Kylie [name] is a 68 y.o. female with medical history significant for prior renal cell carcinoma s/p R nephrectomy (12/2007), HTN, HLD, and dementia, prior CVA with aphasia, diabetes and history of PE.  Brought to the ED after she was found down by family.  Patient lives alone and they check in on her frequently and last known well was the day prior. While sitting in triage she appeared slumped over and blood sugar was noted to be low.  She received an amp of D50. ED course.  Vitals initially within normal limits however blood pressure trended down during workup to 98/65. CK 2158.  Creatinine 1.18, CBC normal UA unremarkable.  EKG mild tachycardia Chest x-ray clear CT head and C-spine without trauma Patient treated with a fluid bolus and was started on D5 half NS after receiving D50 Hospitalist consulted for admission.     Past Medical History:  Diagnosis Date   Arthritis    CAD in native artery    CKD (chronic kidney disease), stage III (HCC)    Dementia (HCC)    Depression    Hypertension    Hypothyroidism    Pulmonary embolism (HCC) 2014   Renal cell carcinoma of right kidney (HCC) 2009   Sleep apnea    Solitary kidney, acquired 2009   right kidney removed   Stroke [facility])    Type 2 diabetes mellitus (HCC)    no current medications   Past Surgical History:  Procedure Laterality Date   CATARACT EXTRACTION W/PHACO Left 01/13/2023   Procedure: CATARACT EXTRACTION PHACO AND INTRAOCULAR LENS PLACEMENT (IOC) LEFT VISION BLUE HEALON 5 13.28 01:06.8;  Surgeon: Mittie Gaskin, MD;  Location: [facility];  Service: Ophthalmology;  Laterality: Left;   NEPHRECTOMY Right 2009   Rt kidney removal  Right    Social History:  reports that she has never smoked. She has never used smokeless tobacco. She reports that she does not drink alcohol  and does not use drugs.  Allergies  Allergen Reactions   Aspirin     On warfarin   Bee Venom    Lisinopril Cough   Metformin Other (See Comments)    Nausea with significant weight loss   Spironolactone     Hyperkalemia   Wasp Venom Protein     No family history on file.  Prior to Admission medications   Medication Sig Start Date End Date Taking? Authorizing Provider  acetaminophen  (TYLENOL ) 650 MG CR tablet Take 1,300 mg by mouth in the morning and at bedtime.    [provider]  amLODipine  (NORVASC ) 10 MG tablet Take 10 mg by mouth daily.    [provider]  Carboxymethylcellul-Glycerin (REFRESH OPTIVE OP) Place 1 drop into both eyes in the morning and at bedtime.    [provider]  cloNIDine  (CATAPRES -TTS-2) 0.2 mg/24hr patch Place 0.2 mg onto the skin once a week. Wednesdays    [provider]  EPINEPHrine  0.3 mg/0.3 mL IJ SOAJ injection Inject 0.3 mg into the muscle. Patient not taking: Reported on 01/06/2023 11/24/16   [provider]  levothyroxine  (SYNTHROID , LEVOTHROID) 100 MCG tablet TAKE 1 TABLET BY MOUTH EVERY MORNING 09/23/16   [provider]  lidocaine  (XYLOCAINE ) 4 % external  solution Apply topically as needed (both knees).    [provider]  losartan  (COZAAR ) 100 MG tablet Take 100 mg by mouth daily.    [provider]  meloxicam (MOBIC) 7.5 MG tablet Take 7.5 mg by mouth daily.    [provider]  OLANZapine  (ZYPREXA ) 10 MG tablet Take 10 mg by mouth daily.    [provider]  rivaroxaban  (XARELTO ) 10 MG TABS tablet Take 10 mg by mouth daily.    [provider]  rosuvastatin  (CRESTOR ) 5 MG tablet Take 5 mg by mouth at bedtime.    [provider]  trolamine salicylate (ASPERCREME) 10 % cream Apply 1 Application topically  3 (three) times daily as needed for muscle pain (knee pain).    [provider]  vitamin D3 (CHOLECALCIFEROL ) 25 MCG tablet Take 1,000 Units by mouth daily.    [provider]    Physical Exam: Vitals:   08/02/23 2330 08/03/23 0015 08/03/23 0100 08/03/23 0213  BP: (!) 101/56 102/60 98/65 (!) 90/53  Pulse: 90 (!) 116 91 80  Resp:  18 15 13   Temp:    98.7 F (37.1 C)  TempSrc:    Oral  SpO2:  100% 100% 100%  Weight:       Physical Exam Vitals and nursing note reviewed.  Constitutional:      General: She is sleeping. She is not in acute distress. HENT:     Head: Normocephalic and atraumatic.  Cardiovascular:     Rate and Rhythm: Normal rate and regular rhythm.     Heart sounds: Normal heart sounds.  Pulmonary:     Effort: Pulmonary effort is normal.     Breath sounds: Normal breath sounds.  Abdominal:     Palpations: Abdomen is soft.     Tenderness: There is no abdominal tenderness.  Neurological:     General: No focal deficit present.     Mental Status: She is easily aroused.     Labs on Admission: I have personally reviewed following labs and imaging studies  CBC: Recent Labs  Lab 08/02/23 1810  WBC 8.8  HGB 12.6  HCT 38.4  MCV 97.2  PLT 270   Basic Metabolic Panel: Recent Labs  Lab 08/02/23 1810  NA 141  K 3.9  CL 102  CO2 24  GLUCOSE 93  BUN 42*  CREATININE 1.18*  CALCIUM  9.6   GFR: CrCl cannot be calculated (Unknown ideal weight.). Liver Function Tests: Recent Labs  Lab 08/02/23 1810  AST 74*  ALT 44  ALKPHOS 49  BILITOT 0.8  PROT 6.8  ALBUMIN 3.5   No results for input(s): LIPASE, AMYLASE in the last 168 hours. No results for input(s): AMMONIA in the last 168 hours. Coagulation Profile: No results for input(s): INR, PROTIME in the last 168 hours. Cardiac Enzymes: Recent Labs  Lab 08/02/23 1850  CKTOTAL 2,158*   BNP (last 3 results) No results for input(s): PROBNP in the last 8760 hours. HbA1C: No  results for input(s): HGBA1C in the last 72 hours. CBG: Recent Labs  Lab 08/02/23 2038 08/02/23 2110 08/02/23 2232 08/03/23 0012  GLUCAP 45* 116* 162* 139*   Lipid Profile: No results for input(s): CHOL, HDL, LDLCALC, TRIG, CHOLHDL, LDLDIRECT in the last 72 hours. Thyroid Function Tests: No results for input(s): TSH, T4TOTAL, FREET4, T3FREE, THYROIDAB in the last 72 hours. Anemia Panel: No results for input(s): VITAMINB12, FOLATE, FERRITIN, TIBC, IRON , RETICCTPCT in the last 72 hours. Urine analysis:    Component  Value Date/Time   COLORURINE YELLOW (A) 08/03/2023 0025   APPEARANCEUR HAZY (A) 08/03/2023 0025   APPEARANCEUR Clear 04/01/2013 1228   LABSPEC 1.018 08/03/2023 0025   LABSPEC 1.014 04/01/2013 1228   PHURINE 5.0 08/03/2023 0025   GLUCOSEU NEGATIVE 08/03/2023 0025   GLUCOSEU Negative 04/01/2013 1228   HGBUR NEGATIVE 08/03/2023 0025   BILIRUBINUR NEGATIVE 08/03/2023 0025   BILIRUBINUR Negative 04/01/2013 1228   KETONESUR 20 (A) 08/03/2023 0025   PROTEINUR 30 (A) 08/03/2023 0025   NITRITE NEGATIVE 08/03/2023 0025   LEUKOCYTESUR NEGATIVE 08/03/2023 0025   LEUKOCYTESUR Negative 04/01/2013 1228    Radiological Exams on Admission: DG Chest Portable 1 View Result Date: 08/02/2023 CLINICAL DATA:  Fall.  Right arm pain. EXAM: PORTABLE CHEST 1 VIEW COMPARISON:  CT chest 10/19/2021 FINDINGS: Normal heart size and pulmonary vascularity. No focal airspace disease or consolidation in the lungs. No blunting of costophrenic angles. No pneumothorax. Mediastinal contours appear intact. Degenerative changes in the spine and shoulders. Surgical clips in the right upper quadrant. IMPRESSION: No active disease. Electronically Signed   By: Elsie Gravely M.D.   On: 08/02/2023 22:07   CT Cervical Spine Wo Contrast Result Date: 08/02/2023 CLINICAL DATA:  Patient was found down on the floor for an unknown amount of time. Fall. Incontinence of urine and  stool. EXAM: CT CERVICAL SPINE WITHOUT CONTRAST TECHNIQUE: Multidetector CT imaging of the cervical spine was performed without intravenous contrast. Multiplanar CT image reconstructions were also generated. RADIATION DOSE REDUCTION: This exam was performed according to the departmental dose-optimization program which includes automated exposure control, adjustment of the mA and/or kV according to patient size and/or use of iterative reconstruction technique. COMPARISON:  None Available. FINDINGS: Alignment: Reversal of the usual cervical lordosis without anterior subluxation. Normal alignment of the facet joints. Alignment is likely due to patient positioning and degenerative change but muscle spasm can have this appearance. Skull base and vertebrae: Skull base appears intact. No vertebral compression deformities. No focal bone lesion or bone destruction. Bone cortex appears intact. Soft tissues and spinal canal: No prevertebral soft tissue swelling. No abnormal paraspinal soft tissue mass or infiltration. Vascular calcifications. Disc levels: Degenerative changes with disc space narrowing and endplate osteophyte throughout the cervical spine. Prominent anterior and posterior osteophytes. Posterior disc osteophyte complexes cause some encroachment on the anterior thecal sac at C3-4, C4-5, and C5-6 levels. Uncovertebral and facet joint spurring causes bone encroachment upon several neural foramina bilaterally. Upper chest: Lung apices are clear. Other: None. IMPRESSION: 1. Nonspecific reversal of the usual cervical lordosis. No acute displaced fractures identified. 2. Severe multilevel degenerative changes. Electronically Signed   By: Elsie Gravely M.D.   On: 08/02/2023 22:07   CT HEAD WO CONTRAST ( ) Result Date: 08/02/2023 CLINICAL DATA:  Found down. On Xarelto . Unknown down time. Incontinence of urine and stool. EXAM: CT HEAD WITHOUT CONTRAST TECHNIQUE: Contiguous axial images were obtained from the base  of the skull through the vertex without intravenous contrast. RADIATION DOSE REDUCTION: This exam was performed according to the departmental dose-optimization program which includes automated exposure control, adjustment of the mA and/or kV according to patient size and/or use of iterative reconstruction technique. COMPARISON:  None Available. FINDINGS: Brain: Diffuse cerebral atrophy. Ventricular dilatation consistent with central atrophy. Low-attenuation changes in the deep white matter consistent with small vessel ischemia. No abnormal extra-axial fluid collections. No mass effect or midline shift. Gray-white matter junctions are distinct. Basal cisterns are not effaced. No acute intracranial hemorrhage. Vascular: No hyperdense vessel or  unexpected calcification. Skull: Normal. Negative for fracture or focal lesion. Sinuses/Orbits: No acute finding. Other: None. IMPRESSION: No acute intracranial abnormalities. Chronic atrophy and small vessel ischemic changes. Electronically Signed   By: Elsie Gravely M.D.   On: 08/02/2023 22:03     Data Reviewed: Relevant notes from primary care and specialist visits, past discharge summaries as available in EHR, including Care Everywhere. Prior diagnostic testing as pertinent to current admission diagnoses Updated medications and problem lists for reconciliation ED course, including vitals, labs, imaging, treatment and response to treatment Triage notes, nursing and pharmacy notes and ED provider's notes Notable results as noted in HPI   Assessment and Plan: * Rhabdomyolysis, traumatic Continue IV hydration Monitor CK PT eval Fall precautions  Uncontrolled type 2 diabetes mellitus with hypoglycemia, without long-term current use of insulin  (HCC) Continue D5 NS No hypoglycemic seen on med list  History of CVA (cerebrovascular accident) Continuing Xarelto   History of pulmonary embolism Chronic anticoagulation Continue Xarelto   S/p R nephrectomy  for RCC No acute issues suspected  Dementia (HCC) Delirium precautions  Essential hypertension Soft blood pressures so will hold antihypertensive  Acquired hypothyroidism Continue levothyroxine         DVT prophylaxis: Xarelto   Consults: none  Advance Care Planning: full code  Family Communication: none  Disposition Plan: Back to previous home environment  Severity of Illness: The appropriate patient status for this patient is INPATIENT. Inpatient status is judged to be reasonable and necessary in order to provide the required intensity of service to ensure the patient's safety. The patient's presenting symptoms, physical exam findings, and initial radiographic and laboratory data in the context of their chronic comorbidities is felt to place them at high risk for further clinical deterioration. Furthermore, it is not anticipated that the patient will be medically stable for discharge from the hospital within 2 midnights of admission.   * I certify that at the point of admission it is my clinical judgment that the patient will require inpatient hospital care spanning beyond 2 midnights from the point of admission due to high intensity of service, high risk for further deterioration and high frequency of surveillance required.*  Author: Delayne LULLA Solian, MD 08/03/2023 3:59 AM  For on call review [email or web address].

## 2023-08-03 NOTE — ED Notes (Signed)
Pt A+Ox3 at this time, disoriented to time, unable to state the year. Fall bracelet placed, bed alarm activated, and non slip socks placed. BG WNL.

## 2023-08-03 NOTE — Assessment & Plan Note (Signed)
 Continue levothyroxine

## 2023-08-03 NOTE — Assessment & Plan Note (Signed)
Soft blood pressures so will hold antihypertensive

## 2023-08-04 DIAGNOSIS — E162 Hypoglycemia, unspecified: Secondary | ICD-10-CM | POA: Diagnosis not present

## 2023-08-04 DIAGNOSIS — E039 Hypothyroidism, unspecified: Secondary | ICD-10-CM | POA: Diagnosis not present

## 2023-08-04 DIAGNOSIS — Z7901 Long term (current) use of anticoagulants: Secondary | ICD-10-CM | POA: Diagnosis not present

## 2023-08-04 DIAGNOSIS — T796XXA Traumatic ischemia of muscle, initial encounter: Secondary | ICD-10-CM | POA: Diagnosis not present

## 2023-08-04 LAB — CBG MONITORING, ED
Glucose-Capillary: 76 mg/dL (ref 70–99)
Glucose-Capillary: 83 mg/dL (ref 70–99)
Glucose-Capillary: 81 mg/dL (ref 70–99)

## 2023-08-04 LAB — CORTISOL-AM, BLOOD: Cortisol - AM: 9.9 ug/dL (ref 6.7–22.6)

## 2023-08-04 LAB — CK: Total CK: 797 U/L — ABNORMAL HIGH (ref 38–234)

## 2023-08-04 LAB — INSULIN AND C-PEPTIDE, SERUM
C-Peptide: 9 ng/mL — ABNORMAL HIGH (ref 1.1–4.4)
Insulin: 23.2 u[IU]/mL (ref 2.6–24.9)

## 2023-08-04 MED ORDER — CLONIDINE HCL 0.2 MG/24HR TD PTWK
0.2000 mg | MEDICATED_PATCH | TRANSDERMAL | Status: DC
  Administered 2023-08-04: 0.2 mg via TRANSDERMAL
  Filled 2023-08-04: qty 1

## 2023-08-04 MED ORDER — VITAMIN D 25 MCG (1000 UNIT) PO TABS
1000.0000 [IU] | ORAL_TABLET | Freq: Every day | ORAL | Status: DC
  Administered 2023-08-04 – 2023-08-05 (×2): 1000 [IU] via ORAL
  Filled 2023-08-04 (×2): qty 1

## 2023-08-04 MED ORDER — DULOXETINE HCL 30 MG PO CPEP
30.0000 mg | ORAL_CAPSULE | Freq: Every day | ORAL | Status: DC
  Administered 2023-08-04 – 2023-08-05 (×2): 30 mg via ORAL
  Filled 2023-08-04 (×2): qty 1

## 2023-08-04 MED ORDER — AMLODIPINE BESYLATE 10 MG PO TABS
10.0000 mg | ORAL_TABLET | Freq: Every day | ORAL | Status: DC
  Administered 2023-08-04 – 2023-08-05 (×2): 10 mg via ORAL
  Filled 2023-08-04 (×2): qty 1

## 2023-08-04 NOTE — Assessment & Plan Note (Addendum)
 Improving.  PT is recommending SNF. -Encourage p.o. hydration -Continue to monitor

## 2023-08-04 NOTE — Assessment & Plan Note (Addendum)
 Patient was not on any diabetic medications at home.  A1c of 5.4, not sure whether she was diabetic or it has been improved. Normal insulin  with mildly elevated C-peptide CBG remained within lower goal, -Stop further IV fluid with D5 -Checking a.m. cortisol and proinsulin -Monitor CBG

## 2023-08-04 NOTE — Assessment & Plan Note (Signed)
 Blood pressure started trending up. -Restart home amlodipine  and clonidine  -Keep holding losartan -we will add as appropriate

## 2023-08-04 NOTE — ED Notes (Signed)
 Pt sleeping w/ even and unlabored respirations.

## 2023-08-04 NOTE — Evaluation (Signed)
 Occupational Therapy Evaluation Patient Details Name: Kylie [name] MRN: [id number] DOB: 17-Sep-1955 Today's Date: 08/04/2023   History of Present Illness Kylie [name] is a 68 y.o. female with medical history significant for prior renal cell carcinoma s/p R nephrectomy (12/2007), HTN, HLD, and dementia, prior CVA with aphasia, diabetes and history of PE.  Brought to the ED after she was found down by family.  Patient lives alone and they check in on her frequently and last known well was the day prior.   Clinical Impression   Patient received for OT evaluation. See flowsheet below for details of function. Generally, patient requiring MOD A for bed mobility, unable to perform functional mobility, and setup-MIN A for UB ADLs and MAX-dependent for LB ADLs. Pt unable to stand or scoot today.  Patient will benefit from continued OT while in acute care.       If plan is discharge home, recommend the following: Two people to help with walking and/or transfers;A lot of help with bathing/dressing/bathroom;Assistance with cooking/housework;Direct supervision/assist for medications management;Direct supervision/assist for financial management;Assist for transportation;Help with stairs or ramp for entrance;Supervision due to cognitive status    Functional Status Assessment  Patient has had a recent decline in their functional status and demonstrates the ability to make significant improvements in function in a reasonable and predictable amount of time.  Equipment Recommendations  Other (comment) (defer to next venue of care)    Recommendations for Other Services       Precautions / Restrictions Precautions Precautions: Fall Restrictions Weight Bearing Restrictions Per Provider Order: No      Mobility Bed Mobility Overal bed mobility: Needs Assistance Bed Mobility: Supine to Sit, Sit to Supine     Supine to sit: Mod assist Sit to supine: Mod assist   General bed mobility comments: Pt  requiring assistance with RLE to move off EOB (farthest from bed).    Transfers Overall transfer level: Needs assistance                 General transfer comment: Pt unable to perform STS transfer at this time; attempted scooting at EOB with multiple cues and heavy assist, but pt unable to make any meaningful progress at EOB.      Balance Overall balance assessment:  (Supervision while seated EOB.)                                         ADL either performed or assessed with clinical judgement   ADL Overall ADL's : Needs assistance/impaired Eating/Feeding: Set up Eating/Feeding Details (indicate cue type and reason): OT handed pt orange juice in cup with straw and lid; pt able to drink some (RN approved giving of the juice). Pt unable to reach far enough to the L to place it back on the tray table next to her.   Grooming Details (indicate cue type and reason): anticipate pt would require set up from seated only   Upper Body Bathing Details (indicate cue type and reason): anticipate set up from seated only and extra time   Lower Body Bathing Details (indicate cue type and reason): anticipate MAX A, seated only Upper Body Dressing : Set up;Minimal assistance Upper Body Dressing Details (indicate cue type and reason): OT assisted pt to change gown while seated at EOB   Lower Body Dressing Details (indicate cue type and reason): anticipate dependent   Toilet Transfer Details (indicate  cue type and reason): anticipate dependent bed level at this time   Toileting - Clothing Manipulation Details (indicate cue type and reason): anticipate dependent bed level; OT checked pt's brief to ensure it was dry.   Tub/Shower Transfer Details (indicate cue type and reason): unable to do this transfer at this time Functional mobility during ADLs:  (patient unable to perform functional mobility) General ADL Comments: Pt requires significant assist with all ADLs at this time;  very high risk for falls; very weak.     Vision Baseline Vision/History:  (unknown)       Perception         Praxis         Pertinent Vitals/Pain Pain Assessment Pain Assessment: No/denies pain     Extremity/Trunk Assessment Upper Extremity Assessment Upper Extremity Assessment: Generalized weakness;RUE deficits/detail;LUE deficits/detail (pt unable to state if she is R or L handed) RUE Deficits / Details: shoulder flexion to approx 90 degrees AROM LUE Deficits / Details: shoulder flexion to approx 90 degrees AROM   Lower Extremity Assessment Lower Extremity Assessment: Generalized weakness       Communication Communication Communication: No apparent difficulties   Cognition Arousal: Alert Behavior During Therapy: WFL for tasks assessed/performed, Flat affect Overall Cognitive Status: No family/caregiver present to determine baseline cognitive functioning                                 General Comments: Pt has dx of dementia in medical record. Is unable to state the month, day, or year; is unable to state the location, but does say this is a place for sick people; unable to state what town/city we are in.  Is able to make needs known during session- states I'm cold, and I'm afraid to fall, and I'd like some juice.     General Comments  Pt on room air, O2 100% during session.    Exercises     Shoulder Instructions      Home Living Family/patient expects to be discharged to:: Private residence Living Arrangements: Alone Available Help at Discharge: Family                             Additional Comments: Unknown PLOF and living situation; pt poor historian; does state she lives alone with two adult children in the area who come by frequently, which appears correct from the medical record.      Prior Functioning/Environment Prior Level of Function : Needs assist             Mobility Comments: Pt states she intermittently  uses a [employer]; cannot provide additional information about that. Pt does state I've been doing a lot worse the last few days. ADLs Comments: Unknown how much assist she was receiving with ADLs.        OT Problem List: Decreased strength;Decreased activity tolerance;Decreased range of motion;Impaired balance (sitting and/or standing)      OT Treatment/Interventions: Self-care/ADL training;Therapeutic exercise;DME and/or AE instruction;Therapeutic activities;Patient/family education    OT Goals(Current goals can be found in the care plan section) Acute Rehab OT Goals Patient Stated Goal: get better OT Goal Formulation: With patient Time For Goal Achievement: 08/18/23 Potential to Achieve Goals: Good ADL Goals Pt Will Perform Grooming: standing;with contact guard assist Pt Will Perform Lower Body Dressing: with mod assist;sit to/from stand Pt Will Transfer to Toilet: with min assist;bedside commode Pt  Will Perform Toileting - Clothing Manipulation and hygiene: with min assist;sit to/from stand  OT Frequency: Min 1X/week    Co-evaluation              AM-PAC OT 6 Clicks Daily Activity     Outcome Measure Help from another person eating meals?: None Help from another person taking care of personal grooming?: A Little Help from another person toileting, which includes using toliet, bedpan, or urinal?: Total Help from another person bathing (including washing, rinsing, drying)?: A Lot Help from another person to put on and taking off regular upper body clothing?: A Little Help from another person to put on and taking off regular lower body clothing?: Total 6 Click Score: 14   End of Session Nurse Communication: Mobility status;Other (comment) (pt wanting orange juice; RN gave permission for OT to provide juice)  Activity Tolerance: Patient limited by fatigue Patient left: in bed;with call bell/phone within reach;with bed alarm set  OT Visit Diagnosis: Unsteadiness on feet  (R26.81);History of falling (Z91.81);Other symptoms and signs involving cognitive function                Time: 9166-9143 OT Time Calculation (min): 23 min Charges:  OT General Charges $OT Visit: 1 Visit OT Evaluation $OT Eval Moderate Complexity: 1 Mod  Kylie [name] Arlean Shams, MS, OTR/L  Kylie [name] 08/04/2023, 9:17 AM

## 2023-08-04 NOTE — Evaluation (Signed)
 Physical Therapy Evaluation Patient Details Name: Kylie [name] MRN: [id number] DOB: 11-19-1955 Today's Date: 08/04/2023  History of Present Illness  Patient is a 68 year old female found down by family and found to have rhabdomyolysis. History of renal cell carcinoma s/p R nephrectomy,  dementia, CVA with aphasia, diabetes, PE  Clinical Impression  Patient is agreeable to PT evaluation. She is confused but cooperative. She reports she was ambulatory at home, however no family present to confirm.  Today the patient required assistance with bed mobility. She also required assistance for standing. Standing balance is poor with external support required. Unable to stand long enough to progress ambulation at this time. The patient does not appear to be at her baseline level of functional independence. Recommend rehabilitation < 3 hours/day after this hospital stay.       If plan is discharge home, recommend the following: Two people to help with walking and/or transfers;A lot of help with bathing/dressing/bathroom;Help with stairs or ramp for entrance;Assist for transportation;Supervision due to cognitive status;Assistance with cooking/housework   Can travel by private vehicle   No    Equipment Recommendations None recommended by PT  Recommendations for Other Services       Functional Status Assessment Patient has had a recent decline in their functional status and demonstrates the ability to make significant improvements in function in a reasonable and predictable amount of time.     Precautions / Restrictions Precautions Precautions: Fall Restrictions Weight Bearing Restrictions Per Provider Order: No      Mobility  Bed Mobility Overal bed mobility: Needs Assistance Bed Mobility: Supine to Sit, Sit to Supine     Supine to sit: Mod assist Sit to supine: Mod assist   General bed mobility comments: assistance for trunk and BLE support    Transfers Overall transfer level:  Needs assistance Equipment used: 1 person hand held assist Transfers: Sit to/from Stand Sit to Stand: Max assist           General transfer comment: Max A for standing x 1 bouts. Max A also for lateral scooting to the left while sitting. activity tolerance limited by fatigue    Ambulation/Gait               General Gait Details: unable to progress walking due to poor standing tolerance  Stairs            Wheelchair Mobility     Tilt Bed    Modified Rankin (Stroke Patients Only)       Balance Overall balance assessment: Needs assistance Sitting-balance support: Feet supported Sitting balance-Leahy Scale: Fair     Standing balance support: Single extremity supported Standing balance-Leahy Scale: Poor Standing balance comment: external support required to maintain standing balance. minimal standing tolerance secondary to generalized weakness                             Pertinent Vitals/Pain Pain Assessment Pain Assessment: No/denies pain    Home Living Family/patient expects to be discharged to:: Private residence Living Arrangements: Alone Available Help at Discharge: Family Type of Home: House             Additional Comments: patient is a poor historian. chart indicates patient lives alone and is in the [facility]    Prior Function Prior Level of Function : Needs assist             Mobility Comments: patient reports she walks with a  cane but no family present to confirm ADLs Comments: unknown     Extremity/Trunk Assessment   Upper Extremity Assessment Upper Extremity Assessment: Generalized weakness RUE Deficits / Details: shoulder flexion to approx 90 degrees AROM LUE Deficits / Details: shoulder flexion to approx 90 degrees AROM    Lower Extremity Assessment Lower Extremity Assessment: Generalized weakness       Communication   Communication Communication: No apparent difficulties  Cognition Arousal:  Alert Behavior During Therapy: WFL for tasks assessed/performed, Flat affect Overall Cognitive Status: History of cognitive impairments - at baseline                                 General Comments: Patient is oriented to self and place. She is disoriented to time. She is able to follow single step commands with increased time with multi-modal cues required        General Comments General comments (skin integrity, edema, etc.): Pt on room air, O2 100% during session.    Exercises     Assessment/Plan    PT Assessment Patient needs continued PT services  PT Problem List Decreased strength;Decreased activity tolerance;Decreased balance;Decreased mobility;Decreased cognition;Decreased safety awareness       PT Treatment Interventions DME instruction;Gait training;Stair training;Functional mobility training;Therapeutic activities;Therapeutic exercise;Balance training;Neuromuscular re-education;Cognitive remediation;Patient/family education;Wheelchair mobility training    PT Goals (Current goals can be found in the Care Plan section)  Acute Rehab PT Goals Patient Stated Goal: none stated PT Goal Formulation: With patient Time For Goal Achievement: 08/18/23 Potential to Achieve Goals: Fair    Frequency Min 1X/week     Co-evaluation               AM-PAC PT 6 Clicks Mobility  Outcome Measure Help needed turning from your back to your side while in a flat bed without using bedrails?: A Lot Help needed moving from lying on your back to sitting on the side of a flat bed without using bedrails?: A Lot Help needed moving to and from a bed to a chair (including a wheelchair)?: A Lot Help needed standing up from a chair using your arms (e.g., wheelchair or bedside chair)?: A Lot Help needed to walk in hospital room?: Total Help needed climbing 3-5 steps with a railing? : Total 6 Click Score: 10    End of Session   Activity Tolerance: Patient tolerated treatment  well Patient left: in bed;with call bell/phone within reach;with bed alarm set   PT Visit Diagnosis: Unsteadiness on feet (R26.81);Muscle weakness (generalized) (M62.81)    Time: 9182-9168 PT Time Calculation (min) (ACUTE ONLY): 14 min   Charges:   PT Evaluation $PT Eval Low Complexity: 1 Low   PT General Charges $$ ACUTE PT VISIT: 1 Visit        Randine Essex, PT, MPT   Randine LULLA Essex 08/04/2023, 9:50 AM

## 2023-08-04 NOTE — ED Notes (Signed)
 This NT gave patient a complete bed bath. Patient was wet. Patient has clean sheets on bed and a clean brief. Patient is resting comfortably at this time.

## 2023-08-04 NOTE — ED Notes (Signed)
 Pt incontinence care provided, brief changed and care provided per order. Skin tears on RUE are pink, dressed per orders.

## 2023-08-04 NOTE — Progress Notes (Signed)
 Progress Note   Patient: Kylie [name] FMW:[id number] DOB: 09-Jul-1955 DOA: 08/02/2023     1 DOS: the patient was seen and examined on 08/04/2023   Brief hospital course: Taken from H&P.  Kylie [name] is a 68 y.o. female with medical history significant for prior renal cell carcinoma s/p R nephrectomy (12/2007), HTN, HLD, and dementia, prior CVA with aphasia, diabetes and history of PE.  Brought to the ED after she was found down by family.  Patient lives alone and they check in on her frequently and last known well was the day prior.  Patient had 1 episode of hypoglycemia while waiting in triage and received an amp of D50.  On presentation vitals stable, CK elevated at 2158, creatinine 1.18, CBC normal and UA unremarkable. EKG mild tachycardia Chest x-ray clear CT head and C-spine without trauma Patient treated with a fluid bolus and was started on D5 half NS after receiving D50.  2/4: Vital stable, CBG within goal at 97 with D5, patient was not taking any diabetic medications at home.  A1c of 5.4.   Patient apparently discharged from a nursing facility after staying there for 1 week on a trial basis on Friday.  Per POA she has very limited mobility, unable to prepare her meal, they are trying to arrange someone who can help her with meal and ADL with the help of pace or family wants her to go to a nursing home.  Patient does not remember that she fell, otherwise oriented to self and place.  CBG now stable with IV fluid and she started eating and drinking.  Likely hypoglycemia with poor p.o. intake, also checking insulin  levels.  PT and OT evaluation ordered.  2/5: Hemodynamically stable, CBG within goal, TSH normal, insulin  levels normal at 23.2 and C-peptide mildly elevated at 9, checking proinsulin and a.m. cortisol levels.   Assessment and Plan: * Rhabdomyolysis, traumatic Improving.  PT is recommending SNF. -Encourage p.o. hydration -Continue to monitor  Hypoglycemia Patient was not  on any diabetic medications at home.  A1c of 5.4, not sure whether she was diabetic or it has been improved. Normal insulin  with mildly elevated C-peptide CBG remained within lower goal, -Stop further IV fluid with D5 -Checking a.m. cortisol and proinsulin -Monitor CBG  History of CVA (cerebrovascular accident) Continuing Xarelto   History of pulmonary embolism Chronic anticoagulation Continue Xarelto   S/p R nephrectomy for RCC No acute issues suspected  Dementia (HCC) Delirium precautions  Essential hypertension Blood pressure started trending up. -Restart home amlodipine  and clonidine  -Keep holding losartan -we will add as appropriate  Acquired hypothyroidism Continue levothyroxine    Subjective: Patient continued to feel weak, p.o. intake improving per nursing staff.  Physical Exam: Vitals:   08/04/23 0442 08/04/23 0600 08/04/23 1006 08/04/23 1217  BP:  131/67  (!) 141/70  Pulse:  68  78  Resp:  11  16  Temp: 97.6 F (36.4 C)  98 F (36.7 C) 98.8 F (37.1 C)  TempSrc: Oral  Oral   SpO2:  100%    Weight:      Height:       General.  Frail elderly lady, in no acute distress. Pulmonary.  Lungs clear bilaterally, normal respiratory effort. CV.  Regular rate and rhythm, no JVD, rub or murmur. Abdomen.  Soft, nontender, nondistended, BS positive. CNS.  Alert and oriented .  No focal neurologic deficit. Extremities.  No edema, no cyanosis, pulses intact and symmetrical.   Data Reviewed: Prior data reviewed  Family Communication: Discussed  with her cousin who is also her POA.  Disposition: Status is: Inpatient Remains inpatient appropriate because: Severity of illness  Planned Discharge Destination: SNF  DVT prophylaxis.  Xarelto  Time spent: 45 minutes  This record has been created using Conservation officer, historic buildings. Errors have been sought and corrected,but may not always be located. Such creation errors do not reflect on the standard of care.    Author: Amaryllis Dare, MD 08/04/2023 1:44 PM  For on call review [email or web address].

## 2023-08-04 NOTE — Plan of Care (Signed)

## 2023-08-05 DIAGNOSIS — Z7901 Long term (current) use of anticoagulants: Secondary | ICD-10-CM | POA: Diagnosis not present

## 2023-08-05 DIAGNOSIS — M6282 Rhabdomyolysis: Secondary | ICD-10-CM

## 2023-08-05 DIAGNOSIS — E162 Hypoglycemia, unspecified: Secondary | ICD-10-CM | POA: Diagnosis not present

## 2023-08-05 DIAGNOSIS — E039 Hypothyroidism, unspecified: Secondary | ICD-10-CM | POA: Diagnosis not present

## 2023-08-05 LAB — BASIC METABOLIC PANEL
Anion gap: 5 (ref 5–15)
BUN: 30 mg/dL — ABNORMAL HIGH (ref 8–23)
CO2: 30 mmol/L (ref 22–32)
Calcium: 8.3 mg/dL — ABNORMAL LOW (ref 8.9–10.3)
Chloride: 105 mmol/L (ref 98–111)
Creatinine, Ser: 0.94 mg/dL (ref 0.44–1.00)
GFR, Estimated: >60 mL/min (ref >60)
Glucose, Bld: 97 mg/dL (ref 70–99)
Potassium: 2.9 mmol/L — ABNORMAL LOW (ref 3.5–5.1)
Sodium: 140 mmol/L (ref 135–145)

## 2023-08-05 LAB — CBC
HCT: 34.9 % — ABNORMAL LOW (ref 36.0–46.0)
Hemoglobin: 11.7 g/dL — ABNORMAL LOW (ref 12.0–15.0)
MCH: 32.4 pg (ref 26.0–34.0)
MCHC: 33.5 g/dL (ref 30.0–36.0)
MCV: 96.7 fL (ref 80.0–100.0)
Platelets: 211 10*3/uL (ref 150–400)
RBC: 3.61 MIL/uL — ABNORMAL LOW (ref 3.87–5.11)
RDW: 12.6 % (ref 11.5–15.5)
WBC: 6.5 10*3/uL (ref 4.0–10.5)
nRBC: 0 % (ref 0.0–0.2)

## 2023-08-05 LAB — CK: Total CK: 485 U/L — ABNORMAL HIGH (ref 38–234)

## 2023-08-05 LAB — MAGNESIUM: Magnesium: 1.6 mg/dL — ABNORMAL LOW (ref 1.7–2.4)

## 2023-08-05 MED ORDER — POTASSIUM CHLORIDE 20 MEQ PO PACK
40.0000 meq | PACK | ORAL | Status: DC
  Administered 2023-08-05: 40 meq via ORAL
  Filled 2023-08-05: qty 2

## 2023-08-05 MED ORDER — MAGNESIUM SULFATE 2 GM/50ML IV SOLN
2.0000 g | Freq: Once | INTRAVENOUS | Status: AC
  Administered 2023-08-05: 2 g via INTRAVENOUS
  Filled 2023-08-05: qty 50

## 2023-08-05 MED ORDER — LOSARTAN POTASSIUM 50 MG PO TABS
100.0000 mg | ORAL_TABLET | Freq: Every day | ORAL | Status: DC
  Administered 2023-08-05: 100 mg via ORAL
  Filled 2023-08-05: qty 2

## 2023-08-05 NOTE — TOC Progression Note (Signed)
 Transition of Care [facility]) - Progression Note    Patient Details  Name: Kylie [name] MRN: [id number] Date of Birth: 03/07/1956  Transition of Care [facility]) CM/SW Contact  Ladene Lady, LCSW Phone Number: 08/05/2023, 10:46 AM  Clinical Narrative:   MARCO [id number]    Expected Discharge Plan: Skilled Nursing Facility Barriers to Discharge: Barriers Resolved  Expected Discharge Plan and Services         Expected Discharge Date: 08/05/23                                     Social Determinants of Health (SDOH) Interventions SDOH Screenings   Food Insecurity: Patient Unable To Answer (08/03/2023)  Housing: Patient Unable To Answer (08/03/2023)  Transportation Needs: Patient Unable To Answer (08/03/2023)  Utilities: Patient Unable To Answer (08/03/2023)  Financial Resource Strain: Low Risk  (09/05/2020)   Received [facility], [facility]  Physical Activity: Inactive (03/04/2021)   Received from [facility]  Social Connections: Patient Unable To Answer (08/03/2023)  Tobacco Use: Low Risk  (08/03/2023)  Health Literacy: High Risk (01/21/2021)   Received from [facility], [facility]    Readmission Risk Interventions     No data to display

## 2023-08-05 NOTE — TOC Transition Note (Signed)
 Transition of Care [facility]) - Discharge Note   Patient Details  Name: Kylie [name] MRN: [id number] Date of Birth: Nov 18, 1955  Transition [facility]) CM/SW Contact:  Ladene Lady, LCSW Phone Number: 08/05/2023, 10:46 AM   Clinical Narrative:   Pt has orders to discharge to [facility] with [facility]. Stacy notified. RN given number for report. DC Summary sent.  [name] notified.    Final next level of care: Skilled Nursing Facility Barriers to Discharge: Barriers Resolved   Patient Goals and CMS Choice Patient states their goals for this hospitalization and ongoing recovery are:: Go to [facility] CMS Medicare.gov Compare Post Acute Care list provided to:: Patient Represenative (must comment)        Discharge Placement              Patient chooses bed at: [facility] Patient to be transferred to facility by: [facility] Name of family member notified: wiley Patient and family notified of of transfer: 08/05/23  Discharge Plan and Services Additional resources added to the After Visit Summary for                                       Social Drivers of Health (SDOH) Interventions SDOH Screenings   Food Insecurity: Patient Unable To Answer (08/03/2023)  Housing: Patient Unable To Answer (08/03/2023)  Transportation Needs: Patient Unable To Answer (08/03/2023)  Utilities: Patient Unable To Answer (08/03/2023)  Financial Resource Strain: Low Risk  (09/05/2020)   Received from [facility], [facility]  Physical Activity: Inactive (03/04/2021)   Received from [facility], [facility]  Social Connections: Patient Unable To Answer (08/03/2023)  Tobacco Use: Low Risk  (08/03/2023)  Health Literacy: High Risk (01/21/2021)   Received [facility], [facility]     Readmission Risk Interventions     No data to display

## 2023-08-05 NOTE — Discharge Summary (Signed)
 Physician Discharge Summary   Patient: Kylie [name] MRN: [id number] DOB: 1955/09/14  Admit date:     08/02/2023  Discharge date: 08/05/23  Discharge Physician: Amaryllis Dare   PCP: [facility], [facility]   Recommendations at discharge:  Please monitor blood glucose level regularly and encourage regular meals and p.o. hydration. Pending proinsulin level Follow-up with primary care provider within a week  Discharge Diagnoses: Principal Problem:   Rhabdomyolysis, traumatic Active Problems:   Hypoglycemia   Acquired hypothyroidism   Chronic anticoagulation   Essential hypertension   Dementia (HCC)   S/p R nephrectomy for RCC   History of pulmonary embolism   History of CVA (cerebrovascular accident)   Hospital Course: Taken from H&P.  Kylie [name] is a 68 y.o. female with medical history significant for prior renal cell carcinoma s/p R nephrectomy (12/2007), HTN, HLD, and dementia, prior CVA with aphasia, diabetes and history of PE.  Brought to the ED after she was found down by family.  Patient lives alone and they check in on her frequently and last known well was the day prior.  Patient had 1 episode of hypoglycemia while waiting in triage and received an amp of D50.  On presentation vitals stable, CK elevated at 2158, creatinine 1.18, CBC normal and UA unremarkable. EKG mild tachycardia Chest x-ray clear CT head and C-spine without trauma Patient treated with a fluid bolus and was started on D5 half NS after receiving D50.  2/4: Vital stable, CBG within goal at 97 with D5, patient was not taking any diabetic medications at home.  A1c of 5.4.   Patient apparently discharged from a nursing facility after staying there for 1 week on a trial basis on Friday.  Per POA she has very limited mobility, unable to prepare her meal, they are trying to arrange someone who can help her with meal and ADL with the help of pace or family wants her to go to a nursing home.  Patient does not  remember that she fell, otherwise oriented to self and place.  CBG now stable with IV fluid and she started eating and drinking.  Likely hypoglycemia with poor p.o. intake, also checking insulin  levels.  PT and OT evaluation ordered.  2/5: Hemodynamically stable, CBG within goal, TSH normal, insulin  levels normal at 23.2 and C-peptide mildly elevated at 9, checking proinsulin and a.m. cortisol levels.  2/6: Remained hemodynamically stable with mildly elevated blood pressure, restarting home losartan .  CK continued to improve.  Stable blood glucose level, hypokalemia with potassium at 2.9 which is being repleted.  A.m. cortisol level at 9.9 but they was checked in the early afternoon. Pending proinsulin levels.  Patient should encourage regular meal and p.o. hydration.  Patient is being discharged to SNF for further management. She will continue on current medications and need to have a close follow-up with her PCP for further assistance.  Assessment and Plan: * Rhabdomyolysis, traumatic Improving.  PT is recommending SNF. -Encourage p.o. hydration -Continue to monitor  Hypoglycemia Patient was not on any diabetic medications at home.  A1c of 5.4, not sure whether she was diabetic or it has been improved. Normal insulin  with mildly elevated C-peptide CBG currently stable without any IV dextrose  A.m. cortisol at 9.9, lower normal but they were drawn early afternoon instead of 8 AM. Pending proinsulin levels -Monitor CBG  History of CVA (cerebrovascular accident) Continuing Xarelto   History of pulmonary embolism Chronic anticoagulation Continue Xarelto   S/p R nephrectomy for RCC No acute issues suspected  Dementia (HCC) Delirium precautions  Essential hypertension Blood pressure started trending up. -Continue home antihypertensives  Acquired hypothyroidism Continue levothyroxine   Consultants: None Procedures performed: None Disposition: Skilled nursing facility Diet  recommendation:  Discharge Diet Orders (From admission, onward)     Start     Ordered   08/05/23 0000  Diet - low sodium heart healthy        08/05/23 0957           Regular diet DISCHARGE MEDICATION: Allergies as of 08/05/2023       Reactions   Aspirin    On warfarin   Bee Venom    Lisinopril Cough   Metformin Other (See Comments)   Nausea with significant weight loss   Spironolactone    Hyperkalemia   Wasp Venom Protein         Medication List     TAKE these medications    acetaminophen  650 MG CR tablet Commonly known as: TYLENOL  Take 1,300 mg by mouth in the morning and at bedtime.   amLODipine  10 MG tablet Commonly known as: NORVASC  Take 10 mg by mouth daily.   Catapres -TTS-2 0.2 mg/24hr patch Generic drug: cloNIDine  Place 0.2 mg onto the skin once a week. Wednesdays   DULoxetine  30 MG capsule Commonly known as: CYMBALTA  Take 30 mg by mouth daily.   EPINEPHrine  0.3 mg/0.3 mL Soaj injection Commonly known as: EPI-PEN Inject 0.3 mg into the muscle.   levothyroxine  100 MCG tablet Commonly known as: SYNTHROID  TAKE 1 TABLET BY MOUTH EVERY MORNING   lidocaine  4 % external solution Commonly known as: XYLOCAINE  Apply topically as needed (both knees).   losartan  100 MG tablet Commonly known as: COZAAR  Take 100 mg by mouth daily.   meloxicam 7.5 MG tablet Commonly known as: MOBIC Take 7.5 mg by mouth daily.   OLANZapine  10 MG tablet Commonly known as: ZYPREXA  Take 10 mg by mouth daily.   REFRESH OPTIVE OP Place 1 drop into both eyes in the morning and at bedtime.   rivaroxaban  10 MG Tabs tablet Commonly known as: XARELTO  Take 10 mg by mouth daily.   rosuvastatin  5 MG tablet Commonly known as: CRESTOR  Take 5 mg by mouth at bedtime.   trolamine salicylate 10 % cream Commonly known as: ASPERCREME Apply 1 Application topically 3 (three) times daily as needed for muscle pain (knee pain).   vitamin D3 25 MCG tablet Commonly known as:  CHOLECALCIFEROL  Take 1,000 Units by mouth daily.        Follow-up Information     Care, Psychologist, Educational. Schedule an appointment as soon as possible for a visit in 1 week(s).   Contact information: [address] [place] [address] [phone]                Discharge Exam: Filed Weights   08/02/23 1810  Weight: 83.2 kg   General.  Frail elderly lady, in no acute distress. Pulmonary.  Lungs clear bilaterally, normal respiratory effort. CV.  Regular rate and rhythm, no JVD, rub or murmur. Abdomen.  Soft, nontender, nondistended, BS positive. CNS.  Alert and oriented .  No focal neurologic deficit. Extremities.  No edema, no cyanosis, pulses intact and symmetrical.  Condition at discharge: stable  The results of significant diagnostics from this hospitalization (including imaging, microbiology, ancillary and laboratory) are listed below for reference.   Imaging Studies: DG Chest Portable 1 View Result Date: 08/02/2023 CLINICAL DATA:  Fall.  Right arm pain. EXAM: PORTABLE CHEST 1 VIEW COMPARISON:  CT chest 10/19/2021 FINDINGS: Normal heart size and pulmonary vascularity. No focal airspace disease or consolidation in the lungs. No blunting of costophrenic angles. No pneumothorax. Mediastinal contours appear intact. Degenerative changes in the spine and shoulders. Surgical clips in the right upper quadrant. IMPRESSION: No active disease. Electronically Signed   By: Elsie Gravely M.D.   On: 08/02/2023 22:07   CT Cervical Spine Wo Contrast Result Date: 08/02/2023 CLINICAL DATA:  Patient was found down on the floor for an unknown amount of time. Fall. Incontinence of urine and stool. EXAM: CT CERVICAL SPINE WITHOUT CONTRAST TECHNIQUE: Multidetector CT imaging of the cervical spine was performed without intravenous contrast. Multiplanar CT image reconstructions were also generated. RADIATION DOSE REDUCTION: This exam was performed according to the departmental dose-optimization  program which includes automated exposure control, adjustment of the mA and/or kV according to patient size and/or use of iterative reconstruction technique. COMPARISON:  None Available. FINDINGS: Alignment: Reversal of the usual cervical lordosis without anterior subluxation. Normal alignment of the facet joints. Alignment is likely due to patient positioning and degenerative change but muscle spasm can have this appearance. Skull base and vertebrae: Skull base appears intact. No vertebral compression deformities. No focal bone lesion or bone destruction. Bone cortex appears intact. Soft tissues and spinal canal: No prevertebral soft tissue swelling. No abnormal paraspinal soft tissue mass or infiltration. Vascular calcifications. Disc levels: Degenerative changes with disc space narrowing and endplate osteophyte throughout the cervical spine. Prominent anterior and posterior osteophytes. Posterior disc osteophyte complexes cause some encroachment on the anterior thecal sac at C3-4, C4-5, and C5-6 levels. Uncovertebral and facet joint spurring causes bone encroachment upon several neural foramina bilaterally. Upper chest: Lung apices are clear. Other: None. IMPRESSION: 1. Nonspecific reversal of the usual cervical lordosis. No acute displaced fractures identified. 2. Severe multilevel degenerative changes. Electronically Signed   By: Elsie Gravely M.D.   On: 08/02/2023 22:07   CT HEAD WO CONTRAST ( ) Result Date: 08/02/2023 CLINICAL DATA:  Found down. On Xarelto . Unknown down time. Incontinence of urine and stool. EXAM: CT HEAD WITHOUT CONTRAST TECHNIQUE: Contiguous axial images were obtained from the base of the skull through the vertex without intravenous contrast. RADIATION DOSE REDUCTION: This exam was performed according to the departmental dose-optimization program which includes automated exposure control, adjustment of the mA and/or kV according to patient size and/or use of iterative reconstruction  technique. COMPARISON:  None Available. FINDINGS: Brain: Diffuse cerebral atrophy. Ventricular dilatation consistent with central atrophy. Low-attenuation changes in the deep white matter consistent with small vessel ischemia. No abnormal extra-axial fluid collections. No mass effect or midline shift. Gray-white matter junctions are distinct. Basal cisterns are not effaced. No acute intracranial hemorrhage. Vascular: No hyperdense vessel or unexpected calcification. Skull: Normal. Negative for fracture or focal lesion. Sinuses/Orbits: No acute finding. Other: None. IMPRESSION: No acute intracranial abnormalities. Chronic atrophy and small vessel ischemic changes. Electronically Signed   By: Elsie Gravely M.D.   On: 08/02/2023 22:03    Microbiology: No results found for this or any previous visit.  Labs: CBC: Recent Labs  Lab 08/02/23 1810 08/05/23 0447  WBC 8.8 6.5  HGB 12.6 11.7*  HCT 38.4 34.9*  MCV 97.2 96.7  PLT 270 211   Basic Metabolic Panel: Recent Labs  Lab 08/02/23 1810 08/05/23 0447  NA 141 140  K 3.9 2.9*  CL 102 105  CO2 24 30  GLUCOSE 93 97  BUN 42* 30*  CREATININE 1.18* 0.94  CALCIUM  9.6 8.3*  Liver Function Tests: Recent Labs  Lab 08/02/23 1810  AST 74*  ALT 44  ALKPHOS 49  BILITOT 0.8  PROT 6.8  ALBUMIN 3.5   CBG: Recent Labs  Lab 08/03/23 0801 08/03/23 1108 08/03/23 1937 08/04/23 0554 08/04/23 1135  GLUCAP 104* 154* 109* 83 81    Discharge time spent: greater than 30 minutes.  This record has been created using Conservation officer, historic buildings. Errors have been sought and corrected,but may not always be located. Such creation errors do not reflect on the standard of care.   Signed: Amaryllis Dare, MD [facility] 08/05/2023

## 2023-08-05 NOTE — Progress Notes (Signed)
 Patient was placed in a disposable gown and a brief and sent out in her own wheelchair via [facility]. Ivs removed. Folder with discharge AVS given to driver and faxed to facility. I called report to [facility] @ [phone] and spoke to [place], [place].

## 2023-08-08 LAB — PROINSULIN/INSULIN RATIO
Insulin: 16 u[IU]/mL
Proinsulin: 10 pmol/L

## 2023-09-03 ENCOUNTER — Other Ambulatory Visit: Payer: Self-pay | Admitting: Family Medicine

## 2023-09-03 DIAGNOSIS — R609 Edema, unspecified: Secondary | ICD-10-CM

## 2023-09-23 ENCOUNTER — Ambulatory Visit: Payer: Medicare (Managed Care)

## 2023-09-28 ENCOUNTER — Ambulatory Visit: Payer: Medicare (Managed Care)

## 2023-10-06 ENCOUNTER — Inpatient Hospital Stay
Admission: EM | Admit: 2023-10-06 | Discharge: 2023-10-11 | DRG: 871 | Disposition: A | Discharge status: Skilled Nursing Facility | Payer: Medicare (Managed Care) | Source: Skilled Nursing Facility | Attending: Obstetrics and Gynecology | Admitting: Obstetrics and Gynecology

## 2023-10-06 ENCOUNTER — Emergency Department: Payer: Medicare (Managed Care)

## 2023-10-06 ENCOUNTER — Other Ambulatory Visit: Payer: Self-pay

## 2023-10-06 DIAGNOSIS — G8929 Other chronic pain: Secondary | ICD-10-CM | POA: Diagnosis present

## 2023-10-06 DIAGNOSIS — D649 Anemia, unspecified: Secondary | ICD-10-CM | POA: Diagnosis present

## 2023-10-06 DIAGNOSIS — J9601 Acute respiratory failure with hypoxia: Secondary | ICD-10-CM | POA: Diagnosis present

## 2023-10-06 DIAGNOSIS — I5021 Acute systolic (congestive) heart failure: Secondary | ICD-10-CM | POA: Diagnosis present

## 2023-10-06 DIAGNOSIS — A4189 Other specified sepsis: Secondary | ICD-10-CM | POA: Diagnosis present

## 2023-10-06 DIAGNOSIS — Z791 Long term (current) use of non-steroidal anti-inflammatories (NSAID): Secondary | ICD-10-CM

## 2023-10-06 DIAGNOSIS — E039 Hypothyroidism, unspecified: Secondary | ICD-10-CM | POA: Diagnosis present

## 2023-10-06 DIAGNOSIS — J159 Unspecified bacterial pneumonia: Secondary | ICD-10-CM | POA: Diagnosis present

## 2023-10-06 DIAGNOSIS — G4733 Obstructive sleep apnea (adult) (pediatric): Secondary | ICD-10-CM | POA: Diagnosis present

## 2023-10-06 DIAGNOSIS — J121 Respiratory syncytial virus pneumonia: Secondary | ICD-10-CM | POA: Diagnosis present

## 2023-10-06 DIAGNOSIS — N179 Acute kidney failure, unspecified: Secondary | ICD-10-CM | POA: Diagnosis present

## 2023-10-06 DIAGNOSIS — Z1152 Encounter for screening for COVID-19: Secondary | ICD-10-CM | POA: Diagnosis not present

## 2023-10-06 DIAGNOSIS — N390 Urinary tract infection, site not specified: Secondary | ICD-10-CM | POA: Diagnosis present

## 2023-10-06 DIAGNOSIS — J210 Acute bronchiolitis due to respiratory syncytial virus: Secondary | ICD-10-CM

## 2023-10-06 DIAGNOSIS — J189 Pneumonia, unspecified organism: Secondary | ICD-10-CM

## 2023-10-06 DIAGNOSIS — I130 Hypertensive heart and chronic kidney disease with heart failure and stage 1 through stage 4 chronic kidney disease, or unspecified chronic kidney disease: Secondary | ICD-10-CM | POA: Diagnosis present

## 2023-10-06 DIAGNOSIS — J9602 Acute respiratory failure with hypercapnia: Secondary | ICD-10-CM | POA: Diagnosis present

## 2023-10-06 DIAGNOSIS — Z8673 Personal history of transient ischemic attack (TIA), and cerebral infarction without residual deficits: Secondary | ICD-10-CM

## 2023-10-06 DIAGNOSIS — A419 Sepsis, unspecified organism: Principal | ICD-10-CM | POA: Diagnosis present

## 2023-10-06 DIAGNOSIS — B9620 Unspecified Escherichia coli [E. coli] as the cause of diseases classified elsewhere: Secondary | ICD-10-CM | POA: Diagnosis present

## 2023-10-06 DIAGNOSIS — Z79890 Hormone replacement therapy (postmenopausal): Secondary | ICD-10-CM

## 2023-10-06 DIAGNOSIS — R6520 Severe sepsis without septic shock: Secondary | ICD-10-CM | POA: Diagnosis present

## 2023-10-06 DIAGNOSIS — F32A Depression, unspecified: Secondary | ICD-10-CM | POA: Diagnosis present

## 2023-10-06 DIAGNOSIS — R0603 Acute respiratory distress: Secondary | ICD-10-CM | POA: Diagnosis not present

## 2023-10-06 DIAGNOSIS — G9341 Metabolic encephalopathy: Secondary | ICD-10-CM | POA: Diagnosis present

## 2023-10-06 DIAGNOSIS — E876 Hypokalemia: Secondary | ICD-10-CM | POA: Diagnosis present

## 2023-10-06 DIAGNOSIS — M79606 Pain in leg, unspecified: Secondary | ICD-10-CM | POA: Diagnosis present

## 2023-10-06 DIAGNOSIS — Z86711 Personal history of pulmonary embolism: Secondary | ICD-10-CM

## 2023-10-06 DIAGNOSIS — I10 Essential (primary) hypertension: Secondary | ICD-10-CM | POA: Diagnosis present

## 2023-10-06 DIAGNOSIS — I69320 Aphasia following cerebral infarction: Secondary | ICD-10-CM | POA: Diagnosis not present

## 2023-10-06 DIAGNOSIS — F0390 Unspecified dementia without behavioral disturbance: Secondary | ICD-10-CM | POA: Diagnosis present

## 2023-10-06 DIAGNOSIS — E872 Acidosis, unspecified: Secondary | ICD-10-CM | POA: Diagnosis present

## 2023-10-06 DIAGNOSIS — Z7901 Long term (current) use of anticoagulants: Secondary | ICD-10-CM

## 2023-10-06 DIAGNOSIS — D509 Iron deficiency anemia, unspecified: Secondary | ICD-10-CM | POA: Diagnosis not present

## 2023-10-06 DIAGNOSIS — Z79899 Other long term (current) drug therapy: Secondary | ICD-10-CM

## 2023-10-06 DIAGNOSIS — Y95 Nosocomial condition: Secondary | ICD-10-CM | POA: Diagnosis present

## 2023-10-06 DIAGNOSIS — E119 Type 2 diabetes mellitus without complications: Secondary | ICD-10-CM

## 2023-10-06 DIAGNOSIS — Z905 Acquired absence of kidney: Secondary | ICD-10-CM

## 2023-10-06 DIAGNOSIS — I2510 Atherosclerotic heart disease of native coronary artery without angina pectoris: Secondary | ICD-10-CM | POA: Diagnosis present

## 2023-10-06 DIAGNOSIS — N183 Chronic kidney disease, stage 3 unspecified: Secondary | ICD-10-CM | POA: Diagnosis present

## 2023-10-06 DIAGNOSIS — Z85528 Personal history of other malignant neoplasm of kidney: Secondary | ICD-10-CM

## 2023-10-06 DIAGNOSIS — I2699 Other pulmonary embolism without acute cor pulmonale: Secondary | ICD-10-CM | POA: Diagnosis present

## 2023-10-06 DIAGNOSIS — E1122 Type 2 diabetes mellitus with diabetic chronic kidney disease: Secondary | ICD-10-CM | POA: Diagnosis present

## 2023-10-06 LAB — CBC WITH DIFFERENTIAL/PLATELET
Abs Immature Granulocytes: 0.09 10*3/uL — ABNORMAL HIGH (ref 0.00–0.07)
Basophils Absolute: 0 10*3/uL (ref 0.0–0.1)
Basophils Relative: 0 %
Eosinophils Absolute: 0 10*3/uL (ref 0.0–0.5)
Eosinophils Relative: 0 %
HCT: 34.7 % — ABNORMAL LOW (ref 36.0–46.0)
Hemoglobin: 11.1 g/dL — ABNORMAL LOW (ref 12.0–15.0)
Immature Granulocytes: 1 %
Lymphocytes Relative: 4 %
Lymphs Abs: 0.7 10*3/uL (ref 0.7–4.0)
MCH: 32.5 pg (ref 26.0–34.0)
MCHC: 32 g/dL (ref 30.0–36.0)
MCV: 101.5 fL — ABNORMAL HIGH (ref 80.0–100.0)
Monocytes Absolute: 1.2 10*3/uL — ABNORMAL HIGH (ref 0.1–1.0)
Monocytes Relative: 7 %
Neutro Abs: 15.5 10*3/uL — ABNORMAL HIGH (ref 1.7–7.7)
Neutrophils Relative %: 88 %
Platelets: 227 10*3/uL (ref 150–400)
RBC: 3.42 MIL/uL — ABNORMAL LOW (ref 3.87–5.11)
RDW: 12.8 % (ref 11.5–15.5)
WBC: 17.4 10*3/uL — ABNORMAL HIGH (ref 4.0–10.5)
nRBC: 0 % (ref 0.0–0.2)

## 2023-10-06 LAB — COMPREHENSIVE METABOLIC PANEL WITH GFR
ALT: 21 U/L (ref 0–44)
AST: 34 U/L (ref 15–41)
Albumin: 3.4 g/dL — ABNORMAL LOW (ref 3.5–5.0)
Alkaline Phosphatase: 63 U/L (ref 38–126)
Anion gap: 9 (ref 5–15)
BUN: 23 mg/dL (ref 8–23)
CO2: 22 mmol/L (ref 22–32)
Calcium: 8.7 mg/dL — ABNORMAL LOW (ref 8.9–10.3)
Chloride: 106 mmol/L (ref 98–111)
Creatinine, Ser: 1.14 mg/dL — ABNORMAL HIGH (ref 0.44–1.00)
GFR, Estimated: 52 mL/min — ABNORMAL LOW (ref >60)
Glucose, Bld: 187 mg/dL — ABNORMAL HIGH (ref 70–99)
Potassium: 3 mmol/L — ABNORMAL LOW (ref 3.5–5.1)
Sodium: 137 mmol/L (ref 135–145)
Total Bilirubin: 1.1 mg/dL (ref 0.0–1.2)
Total Protein: 6.7 g/dL (ref 6.5–8.1)

## 2023-10-06 LAB — URINALYSIS, W/ REFLEX TO CULTURE (INFECTION SUSPECTED)
Bilirubin Urine: NEGATIVE
Glucose, UA: NEGATIVE mg/dL
Hgb urine dipstick: NEGATIVE
Ketones, ur: NEGATIVE mg/dL
Nitrite: NEGATIVE
Protein, ur: >=300 mg/dL — AB
Specific Gravity, Urine: 1.024 (ref 1.005–1.030)
pH: 5 (ref 5.0–8.0)

## 2023-10-06 LAB — RESP PANEL BY RT-PCR (RSV, FLU A&B, COVID)  RVPGX2
Influenza A by PCR: NEGATIVE
Influenza B by PCR: NEGATIVE
Resp Syncytial Virus by PCR: POSITIVE — AB
SARS Coronavirus 2 by RT PCR: NEGATIVE

## 2023-10-06 LAB — PROTIME-INR
INR: 1.2 (ref 0.8–1.2)
Prothrombin Time: 15.7 s — ABNORMAL HIGH (ref 11.4–15.2)

## 2023-10-06 LAB — PROCALCITONIN: Procalcitonin: 0.33 ng/mL

## 2023-10-06 LAB — LACTIC ACID, PLASMA
Lactic Acid, Venous: 2.4 mmol/L (ref 0.5–1.9)
Lactic Acid, Venous: 1.1 mmol/L (ref 0.5–1.9)

## 2023-10-06 MED ORDER — ALBUTEROL SULFATE (2.5 MG/3ML) 0.083% IN NEBU: 2.5000 mg | INHALATION_SOLUTION | RESPIRATORY_TRACT | Status: DC | PRN

## 2023-10-06 MED ORDER — SODIUM CHLORIDE 0.9 % IV SOLN: 500.0000 mg | INTRAVENOUS | Status: DC

## 2023-10-06 MED ORDER — LEVOTHYROXINE SODIUM 100 MCG PO TABS
100.0000 ug | ORAL_TABLET | Freq: Every day | ORAL | Status: DC
  Administered 2023-10-07: 100 ug via ORAL
  Filled 2023-10-06: qty 2

## 2023-10-06 MED ORDER — LACTATED RINGERS IV SOLN
150.0000 mL/h | INTRAVENOUS | Status: AC
Start: 2023-10-06 — End: 2023-10-07
  Administered 2023-10-06: 150 mL/h via INTRAVENOUS

## 2023-10-06 MED ORDER — SODIUM CHLORIDE 0.9 % IV SOLN
2.0000 g | Freq: Once | INTRAVENOUS | Status: AC
  Administered 2023-10-06: 2 g via INTRAVENOUS
  Filled 2023-10-06: qty 20

## 2023-10-06 MED ORDER — OLANZAPINE 10 MG PO TABS
10.0000 mg | ORAL_TABLET | Freq: Every day | ORAL | Status: DC
  Administered 2023-10-07: 10 mg via ORAL
  Filled 2023-10-06: qty 1

## 2023-10-06 MED ORDER — SODIUM CHLORIDE 0.9 % IV BOLUS
1000.0000 mL | Freq: Once | INTRAVENOUS | Status: AC
  Administered 2023-10-06: 1000 mL via INTRAVENOUS

## 2023-10-06 MED ORDER — ONDANSETRON HCL 4 MG/2ML IJ SOLN: 4.0000 mg | Freq: Four times a day (QID) | INTRAMUSCULAR | Status: DC | PRN

## 2023-10-06 MED ORDER — ONDANSETRON HCL 4 MG PO TABS: 4.0000 mg | ORAL_TABLET | Freq: Four times a day (QID) | ORAL | Status: DC | PRN

## 2023-10-06 MED ORDER — RIVAROXABAN 10 MG PO TABS
10.0000 mg | ORAL_TABLET | Freq: Every day | ORAL | Status: DC
  Administered 2023-10-07: 10 mg via ORAL
  Filled 2023-10-06: qty 1

## 2023-10-06 MED ORDER — SODIUM CHLORIDE 0.9 % IV SOLN
500.0000 mg | Freq: Once | INTRAVENOUS | Status: AC
  Administered 2023-10-06: 500 mg via INTRAVENOUS
  Filled 2023-10-06: qty 5

## 2023-10-06 MED ORDER — TRAMADOL HCL 50 MG PO TABS: 50.0000 mg | ORAL_TABLET | Freq: Four times a day (QID) | ORAL | Status: DC | PRN

## 2023-10-06 MED ORDER — SODIUM CHLORIDE 0.9 % IV SOLN: 2.0000 g | INTRAVENOUS | Status: DC

## 2023-10-06 MED ORDER — DULOXETINE HCL 30 MG PO CPEP: 30.0000 mg | ORAL_CAPSULE | Freq: Every day | ORAL | Status: DC

## 2023-10-06 MED ORDER — ACETAMINOPHEN 325 MG PO TABS
650.0000 mg | ORAL_TABLET | Freq: Four times a day (QID) | ORAL | Status: DC | PRN
  Administered 2023-10-06: 650 mg via ORAL
  Filled 2023-10-06: qty 2

## 2023-10-06 MED ORDER — ROSUVASTATIN CALCIUM 10 MG PO TABS
5.0000 mg | ORAL_TABLET | Freq: Every day | ORAL | Status: DC
  Administered 2023-10-06: 5 mg via ORAL
  Filled 2023-10-06: qty 1

## 2023-10-06 MED ORDER — ACETAMINOPHEN 650 MG RE SUPP
650.0000 mg | Freq: Four times a day (QID) | RECTAL | Status: DC | PRN
  Administered 2023-10-07: 650 mg via RECTAL
  Filled 2023-10-06: qty 2

## 2023-10-06 NOTE — Assessment & Plan Note (Addendum)
 RSV pneumonia, suspect bacterial superinfection vs aspiration Acute respiratory failure with hypoxia Sepsis criteria include fever, tachycardia and tachypnea, leukocytosis with lactic acidosis, hypoxia, altered mental status Patient is in respiratory distress, O2 sat 70% with EMS requiring CPAP for transport transition to BiPAP on arrival and subsequently heated high flow RSV positive.  Will get procalcitonin Bedside swallow eval and sips with meds if cleared, with SLP eval in the a.m. Rocephin and azithromycin Sepsis fluids Albuterol as needed Continue heated high flow and wean as tolerated Strict aspiration precautions SLP eval in the a.m.

## 2023-10-06 NOTE — Assessment & Plan Note (Signed)
 Continue levothyroxine

## 2023-10-06 NOTE — H&P (Signed)
 History and Physical    Patient: Kylie [name] DGU:[id number] DOB: [birth year] DOA: 10/06/2023 DOS: the patient was seen and examined on 10/06/2023 PCP: Care, [facility]  Patient coming from: Home  Chief Complaint:  Chief Complaint  Patient presents with   Shortness of Breath    HPI: Kylie [name] is a 68 y.o. female with medical history significant for prior renal cell carcinoma s/p R nephrectomy (12/2007), HTN,  and dementia, prior CVA with aphasia, diabetes and history of PE on Xarelto, last hospitalized from 2/3 to 08/05/2023 with traumatic rhabdomyolysis, discharged to SNF, was sent from SNF by EMS as a code sepsis.  As reported that they were called out for fever, shortness of breath and increased confusion from baseline.  EMS recorded O2 sat of 70% on room air, placed on CPAP for transport.  Patient unable to contribute to history due to dementia. ED course and data review: Patient transitioned to BiPAP on arrival.  Tmax 102.2, tachycardic to 119 and tachypneic to 33.  BP 154/87.  Saturating at 100% on BiPAP.  Due to poor tolerance of BiPAP she was transitioned to heated high flow at 40 L/min, 50% FiO2 Labs notable for the following WBC 17,000 with lactic acid 2.4-->1.1 and RSV positive. Potassium 3, creatinine 1.14 (near baseline), hemoglobin 11 (near baseline), LFTs unremarkable Urinalysis showing moderate leuks rare bacteria EKG, personally viewed and interpreted showing sinus tachycardia at 119 no acute ST-T wave changes Chest x-ray showing heterogeneous asymmetrical airspace disease left thorax suspect for pneumonia as well as mild central congestion Patient treated with Rocephin and azithromycin and given an NS bolus Hospitalist consulted for admission.     Past Medical History:  Diagnosis Date   Arthritis    CAD in native artery    CKD (chronic kidney disease), stage III (HCC)    Dementia (HCC)    Depression    Hypertension    Hypothyroidism    Pulmonary embolism  (HCC) 2014   Renal cell carcinoma of right kidney (HCC) 2009   Sleep apnea    Solitary kidney, acquired 2009   right kidney removed   Stroke [facility])    Type 2 diabetes mellitus (HCC)    no current medications   Past Surgical History:  Procedure Laterality Date   CATARACT EXTRACTION W/PHACO Left 01/13/2023   Procedure: CATARACT EXTRACTION PHACO AND INTRAOCULAR LENS PLACEMENT (IOC) LEFT VISION BLUE HEALON 5 13.28 01:06.8;  Surgeon: Lockie Mola, MD;  Location: [facility];  Service: Ophthalmology;  Laterality: Left;   NEPHRECTOMY Right 2009   Rt kidney removal Right    Social History:  reports that she has never smoked. She has never used smokeless tobacco. She reports that she does not drink alcohol and does not use drugs.  Allergies  Allergen Reactions   Aspirin     On warfarin   Bee Venom    Lisinopril Cough   Metformin Other (See Comments)    Nausea with significant weight loss   Spironolactone     Hyperkalemia   Wasp Venom Protein     No family history on file.  Prior to Admission medications   Medication Sig Start Date End Date Taking? Authorizing Provider  acetaminophen (TYLENOL) 650 MG CR tablet Take 1,300 mg by mouth in the morning and at bedtime.    [provider]  amLODipine (NORVASC) 10 MG tablet Take 10 mg by mouth daily.    [provider]  Carboxymethylcellul-Glycerin (REFRESH OPTIVE OP) Place 1 drop into both eyes in  the morning and at bedtime.    [provider]  cloNIDine (CATAPRES-TTS-2) 0.2 mg/24hr patch Place 0.2 mg onto the skin once a week. Wednesdays    [provider]  DULoxetine (CYMBALTA) 30 MG capsule Take 30 mg by mouth daily.    [provider]  EPINEPHrine 0.3 mg/0.3 mL IJ SOAJ injection Inject 0.3 mg into the muscle. Patient not taking: Reported on 01/06/2023 11/24/16   [provider]  levothyroxine (SYNTHROID, LEVOTHROID) 100 MCG tablet TAKE 1 TABLET BY MOUTH EVERY MORNING  09/23/16   [provider]  lidocaine (XYLOCAINE) 4 % external solution Apply topically as needed (both knees).    [provider]  losartan (COZAAR) 100 MG tablet Take 100 mg by mouth daily.    [provider]  meloxicam (MOBIC) 7.5 MG tablet Take 7.5 mg by mouth daily.    [provider]  OLANZapine (ZYPREXA) 10 MG tablet Take 10 mg by mouth daily.    [provider]  rivaroxaban (XARELTO) 10 MG TABS tablet Take 10 mg by mouth daily.    [provider]  rosuvastatin (CRESTOR) 5 MG tablet Take 5 mg by mouth at bedtime.    [provider]  trolamine salicylate (ASPERCREME) 10 % cream Apply 1 Application topically 3 (three) times daily as needed for muscle pain (knee pain).    [provider]  vitamin D3 (CHOLECALCIFEROL) 25 MCG tablet Take 1,000 Units by mouth daily.    [provider]    Physical Exam: Vitals:   10/06/23 1943 10/06/23 2015 10/06/23 2028 10/06/23 2130  BP: (!) 154/87   (!) 139/93  Pulse:    (!) 116  Resp:    (!) 24  Temp: 97.9 F (36.6 C) (!) 102.2 F (39 C)    TempSrc: Axillary Rectal    SpO2:    97%  Weight:   86 kg   Height:       Physical Exam Vitals and nursing note reviewed.  Constitutional:      General: She is not in acute distress.    Appearance: She is ill-appearing.  HENT:     Head: Normocephalic and atraumatic.  Cardiovascular:     Rate and Rhythm: Regular rhythm. Tachycardia present.     Heart sounds: Normal heart sounds.  Pulmonary:     Effort: Tachypnea present.     Breath sounds: Normal breath sounds.  Abdominal:     Palpations: Abdomen is soft.     Tenderness: There is no abdominal tenderness.  Neurological:     General: No focal deficit present.     Mental Status: She is alert.     Comments: Alert, answering simple questions appropriately     Labs on Admission: I have personally reviewed following labs and imaging studies  CBC: Recent Labs  Lab  10/06/23 1939  WBC 17.4*  NEUTROABS 15.5*  HGB 11.1*  HCT 34.7*  MCV 101.5*  PLT 227   Basic Metabolic Panel: Recent Labs  Lab 10/06/23 1939  NA 137  K 3.0*  CL 106  CO2 22  GLUCOSE 187*  BUN 23  CREATININE 1.14*  CALCIUM 8.7*   GFR: Estimated Creatinine Clearance: 54.7 mL/min (A) (by C-G formula based on SCr of 1.14 mg/dL (H)). Liver Function Tests: Recent Labs  Lab 10/06/23 1939  AST 34  ALT 21  ALKPHOS 63  BILITOT 1.1  PROT 6.7  ALBUMIN 3.4*   No results for input(s): "LIPASE", "AMYLASE" in the last 168 hours. No  results for input(s): "AMMONIA" in the last 168 hours. Coagulation Profile: Recent Labs  Lab 10/06/23 1939  INR 1.2   Cardiac Enzymes: No results for input(s): "CKTOTAL", "CKMB", "CKMBINDEX", "TROPONINI" in the last 168 hours. BNP (last 3 results) No results for input(s): "PROBNP" in the last 8760 hours. HbA1C: No results for input(s): "HGBA1C" in the last 72 hours. CBG: No results for input(s): "GLUCAP" in the last 168 hours. Lipid Profile: No results for input(s): "CHOL", "HDL", "LDLCALC", "TRIG", "CHOLHDL", "LDLDIRECT" in the last 72 hours. Thyroid Function Tests: No results for input(s): "TSH", "T4TOTAL", "FREET4", "T3FREE", "THYROIDAB" in the last 72 hours. Anemia Panel: No results for input(s): "VITAMINB12", "FOLATE", "FERRITIN", "TIBC", "IRON", "RETICCTPCT" in the last 72 hours. Urine analysis:    Component Value Date/Time   COLORURINE YELLOW (A) 10/06/2023 2013   APPEARANCEUR CLOUDY (A) 10/06/2023 2013   APPEARANCEUR Clear 04/01/2013 1228   LABSPEC 1.024 10/06/2023 2013   LABSPEC 1.014 04/01/2013 1228   PHURINE 5.0 10/06/2023 2013   GLUCOSEU NEGATIVE 10/06/2023 2013   GLUCOSEU Negative 04/01/2013 1228   HGBUR NEGATIVE 10/06/2023 2013   BILIRUBINUR NEGATIVE 10/06/2023 2013   BILIRUBINUR Negative 04/01/2013 1228   KETONESUR NEGATIVE 10/06/2023 2013   PROTEINUR >=300 (A) 10/06/2023 2013   NITRITE NEGATIVE 10/06/2023 2013    LEUKOCYTESUR MODERATE (A) 10/06/2023 2013   LEUKOCYTESUR Negative 04/01/2013 1228    Radiological Exams on Admission: DG Chest Port 1 View Result Date: 10/06/2023 CLINICAL DATA:  Possible sepsis EXAM: PORTABLE CHEST 1 VIEW COMPARISON:  08/02/2023 FINDINGS: Cardiomegaly with mild central congestion. No pleural effusion or pneumothorax. Aortic atherosclerosis. Heterogeneous asymmetrical airspace disease in the left thorax. IMPRESSION: 1. Cardiomegaly with mild central congestion. 2. Heterogeneous asymmetrical airspace disease in the left thorax, suspect for pneumonia. Electronically Signed   By: Jasmine Pang M.D.   On: 10/06/2023 22:08     Data Reviewed: Relevant notes from primary care and specialist visits, past discharge summaries as available in EHR, including Care Everywhere. Prior diagnostic testing as pertinent to current admission diagnoses Updated medications and problem lists for reconciliation ED course, including vitals, labs, imaging, treatment and response to treatment Triage notes, nursing and pharmacy notes and ED provider's notes Notable results as noted in HPI   Assessment and Plan: * Sepsis, severe, due to pneumonia (HCC) RSV pneumonia, suspect bacterial superinfection vs aspiration Acute respiratory failure with hypoxia Sepsis criteria include fever, tachycardia and tachypnea, leukocytosis with lactic acidosis, hypoxia, altered mental status Patient is in respiratory distress, O2 sat 70% with EMS requiring CPAP for transport transition to BiPAP on arrival and subsequently heated high flow RSV positive.  Will get procalcitonin Bedside swallow eval and sips with meds if cleared, with SLP eval in the a.m. Rocephin and azithromycin Sepsis fluids Albuterol as needed Continue heated high flow and wean as tolerated Strict aspiration precautions SLP eval in the a.m.  Dementia (HCC) Possible acute metabolic encephalopathy secondary to sepsis Nursing home reported altered  mental status however baseline unknown Neurologic checks Delirium precautions  History of CVA (cerebrovascular accident) Continue Xarelto and statin  S/p R nephrectomy for RCC No acute issues suspected  Essential hypertension IV hydralazine as needed in the setting of severe sepsis Hold amlodipine, clonidine patch, Cozaar  Chronic anticoagulation due to history of pulmonary embolism Continue Xarelto  Acquired hypothyroidism Continue levothyroxine   DVT prophylaxis: Xarelto  Consults: none  Advance Care Planning:   Code Status: Prior   Family Communication: none  Disposition Plan: Back to previous home environment  Severity  of Illness: The appropriate patient status for this patient is INPATIENT. Inpatient status is judged to be reasonable and necessary in order to provide the required intensity of service to ensure the patient's safety. The patient's presenting symptoms, physical exam findings, and initial radiographic and laboratory data in the context of their chronic comorbidities is felt to place them at high risk for further clinical deterioration. Furthermore, it is not anticipated that the patient will be medically stable for discharge from the hospital within 2 midnights of admission.   * I certify that at the point of admission it is my clinical judgment that the patient will require inpatient hospital care spanning beyond 2 midnights from the point of admission due to high intensity of service, high risk for further deterioration and high frequency of surveillance required.*  Author: Andris Baumann, MD 10/06/2023 10:41 PM  For on call review [email or web address].

## 2023-10-06 NOTE — ED Provider Notes (Addendum)
 [facility] Provider Note    Event Date/Time   First MD Initiated Contact with Patient 10/06/23 1937     (approximate)   History   Shortness of Breath   HPI  Kylie [name] is a 68 y.o. female with a history of PE on Eliquis, dementia presents from facility as a code sepsis via EMS.  Patient found to be febrile as well as hypoxic to 70% on room air.  Patient unable to provide much additional history.  Uncertain as to how long she patient has been symptomatic.  She denies any pain.  She was placed on CPAP with EMS with improvement in aeration and pulse oximetry.  She states that she feels like her breathing is gotten better too.     Physical Exam   Triage Vital Signs: ED Triage Vitals  Encounter Vitals Group     BP 10/06/23 1943 (!) 154/87     Systolic BP Percentile --      Diastolic BP Percentile --      Pulse Rate 10/06/23 1940 (!) 119     Resp 10/06/23 1940 (!) 33     Temp 10/06/23 1943 97.9 F (36.6 C)     Temp Source 10/06/23 1943 Axillary     SpO2 10/06/23 1940 100 %     Weight --      Height 10/06/23 1935 5\' 10"  (1.778 m)     Head Circumference --      Peak Flow --      Pain Score --      Pain Loc --      Pain Education --      Exclude from Growth Chart --     Most recent vital signs: Vitals:   10/06/23 1943 10/06/23 2015  BP: (!) 154/87   Pulse:    Resp:    Temp: 97.9 F (36.6 C) (!) 102.2 F (39 C)  SpO2:       Constitutional: Alert  Eyes: Conjunctivae are normal.  Head: Atraumatic. Nose: No congestion/rhinnorhea. Mouth/Throat: Mucous membranes are moist.   Neck: Painless ROM.  Cardiovascular:   Good peripheral circulation. Respiratory: Mild tachypnea but satting well on CPAP  Gastrointestinal: Soft and nontender.  Musculoskeletal:  no deformity. 2+ BLE pitting edema Neurologic:  MAE spontaneously. No gross focal neurologic deficits are appreciated.  Skin:  Skin is warm, dry and intact. No rash noted.    ED  Results / Procedures / Treatments   Labs (all labs ordered are listed, but only abnormal results are displayed) Labs Reviewed  RESP PANEL BY RT-PCR (RSV, FLU A&B, COVID)  RVPGX2 - Abnormal; Notable for the following components:      Result Value   Resp Syncytial Virus by PCR POSITIVE (*)    All other components within normal limits  LACTIC ACID, PLASMA - Abnormal; Notable for the following components:   Lactic Acid, Venous 2.4 (*)    All other components within normal limits  COMPREHENSIVE METABOLIC PANEL WITH GFR - Abnormal; Notable for the following components:   Potassium 3.0 (*)    Glucose, Bld 187 (*)    Creatinine, Ser 1.14 (*)    Calcium 8.7 (*)    Albumin 3.4 (*)    GFR, Estimated 52 (*)    All other components within normal limits  CBC WITH DIFFERENTIAL/PLATELET - Abnormal; Notable for the following components:   WBC 17.4 (*)    RBC 3.42 (*)    Hemoglobin 11.1 (*)    HCT  34.7 (*)    MCV 101.5 (*)    Neutro Abs 15.5 (*)    Monocytes Absolute 1.2 (*)    Abs Immature Granulocytes 0.09 (*)    All other components within normal limits  PROTIME-INR - Abnormal; Notable for the following components:   Prothrombin Time 15.7 (*)    All other components within normal limits  URINALYSIS, W/ REFLEX TO CULTURE (INFECTION SUSPECTED) - Abnormal; Notable for the following components:   Color, Urine YELLOW (*)    APPearance CLOUDY (*)    Protein, ur >=300 (*)    Leukocytes,Ua MODERATE (*)    Bacteria, UA RARE (*)    Non Squamous Epithelial PRESENT (*)    All other components within normal limits  CULTURE, BLOOD (ROUTINE X 2)  CULTURE, BLOOD (ROUTINE X 2)  URINE CULTURE  LACTIC ACID, PLASMA     EKG  ED ECG REPORT I, Willy Eddy, the attending physician, personally viewed and interpreted this ECG.   Date: 10/06/2023  EKG Time: 19:44  Rate: 120  Rhythm: sinus  Axis: normal  Intervals: normal  ST&T Change: no stemi, no depresionss    RADIOLOGY Please see ED  Course for my review and interpretation.  I personally reviewed all radiographic images ordered to evaluate for the above acute complaints and reviewed radiology reports and findings.  These findings were personally discussed with the patient.  Please see medical record for radiology report.    PROCEDURES:  Critical Care performed: Yes, see critical care procedure note(s)    .Critical Care  Performed by: Willy Eddy, MD Authorized by: Willy Eddy, MD   Critical care provider statement:    Critical care time (minutes):  35   Critical care was necessary to treat or prevent imminent or life-threatening deterioration of the following conditions:  Respiratory failure   Critical care was time spent personally by me on the following activities:  Ordering and performing treatments and interventions, ordering and review of laboratory studies, ordering and review of radiographic studies, pulse oximetry, re-evaluation of patient's condition, review of old charts, obtaining history from patient or surrogate, examination of patient, evaluation of patient's response to treatment, discussions with primary provider, discussions with consultants and development of treatment plan with patient or surrogate    MEDICATIONS ORDERED IN ED: Medications  cefTRIAXone (ROCEPHIN) 2 g in sodium chloride 0.9 % 100 mL IVPB (0 g Intravenous Stopped 10/06/23 2027)  azithromycin (ZITHROMAX) 500 mg in sodium chloride 0.9 % 250 mL IVPB (500 mg Intravenous New Bag/Given 10/06/23 2026)  sodium chloride 0.9 % bolus 1,000 mL (0 mLs Intravenous Stopped 10/06/23 2055)     IMPRESSION / MDM / ASSESSMENT AND PLAN / ED COURSE  I reviewed the triage vital signs and the nursing notes.                              Differential diagnosis includes, but is not limited to, Asthma, copd, CHF, pna, ptx, malignancy, Pe, anemia  Patient presenting to the ER for evaluation of symptoms as described above.  Based on symptoms, risk  factors and considered above differential, this presenting complaint could reflect a potentially life-threatening illness therefore the patient will be placed on continuous pulse oximetry and telemetry for monitoring.  Laboratory evaluation will be sent to evaluate for the above complaints.      Clinical Course as of 10/06/23 2159  Wed Oct 06, 2023  2005 Chest x-ray on my review and  interpretation appears most consistent with pneumonia.  Will give IV fluids.  Lower suspicion for CHF.  Will see if we can transition to heated high flow nasal cannula. [PR]  2022 Given the patient's history of heart failure and only mildly elevated lactate.  Will give judicious IV fluids. [PR]  2112 Patient more comfortable on heated high flow.  Has received broad-spectrum antibiotics.  RVP is positive for RSV.  Does appear stable and appropriate for admission to the hospital. [PR]  2159 Have discussed the case in consultation with hospitalist for admission. [PR]    Clinical Course User Index [PR] Willy Eddy, MD     FINAL CLINICAL IMPRESSION(S) / ED DIAGNOSES   Final diagnoses:  Sepsis with acute hypoxic respiratory failure without septic shock, due to unspecified organism (HCC)  RSV (acute bronchiolitis due to respiratory syncytial virus)     Rx / DC Orders   ED Discharge Orders     None        Note:  This document was prepared using Dragon voice recognition software and may include unintentional dictation errors.    Willy Eddy, MD 10/06/23 2112    Willy Eddy, MD 10/06/23 2159

## 2023-10-06 NOTE — Hospital Course (Signed)
 Marland Kitchen

## 2023-10-06 NOTE — Sepsis Progress Note (Signed)
 Elink monitoring for the code sepsis protocol.

## 2023-10-06 NOTE — Assessment & Plan Note (Signed)
-   Continue Xarelto and statin.

## 2023-10-06 NOTE — Assessment & Plan Note (Signed)
 IV hydralazine as needed in the setting of severe sepsis Hold amlodipine, clonidine patch, Cozaar

## 2023-10-06 NOTE — Assessment & Plan Note (Signed)
 No acute issues suspected

## 2023-10-06 NOTE — Assessment & Plan Note (Signed)
 Continue Xarelto

## 2023-10-06 NOTE — Progress Notes (Signed)
 CODE SEPSIS - PHARMACY COMMUNICATION  **Broad Spectrum Antibiotics should be administered within 1 hour of Sepsis diagnosis**  Time Code Sepsis Called/Page Received: 1935  Antibiotics Ordered: ceftriaxone and azithromycin  Time of 1st antibiotic administration: 1957  Additional action taken by pharmacy: None  If necessary, Name of Provider/Nurse Contacted: None    Rockwell Alexandria ,PharmD Clinical Pharmacist  10/06/2023  7:53 PM

## 2023-10-06 NOTE — ED Triage Notes (Signed)
 Bib ems for shob and sepsis from [facility]  Per ems pts O2 sats were initially in mid 70s on RA, pt placed on cpap, o2 sats currently 96% Per staff at facility pt is more confused than is her normal

## 2023-10-06 NOTE — Assessment & Plan Note (Signed)
 Possible acute metabolic encephalopathy secondary to sepsis Nursing home reported altered mental status however baseline unknown Neurologic checks Delirium precautions

## 2023-10-07 ENCOUNTER — Inpatient Hospital Stay: Payer: Medicare (Managed Care)

## 2023-10-07 ENCOUNTER — Encounter: Payer: Self-pay | Admitting: Internal Medicine

## 2023-10-07 DIAGNOSIS — A419 Sepsis, unspecified organism: Secondary | ICD-10-CM | POA: Diagnosis not present

## 2023-10-07 DIAGNOSIS — E876 Hypokalemia: Secondary | ICD-10-CM

## 2023-10-07 DIAGNOSIS — E119 Type 2 diabetes mellitus without complications: Secondary | ICD-10-CM

## 2023-10-07 DIAGNOSIS — G9341 Metabolic encephalopathy: Secondary | ICD-10-CM

## 2023-10-07 DIAGNOSIS — J189 Pneumonia, unspecified organism: Secondary | ICD-10-CM

## 2023-10-07 DIAGNOSIS — J9601 Acute respiratory failure with hypoxia: Secondary | ICD-10-CM | POA: Diagnosis not present

## 2023-10-07 DIAGNOSIS — D509 Iron deficiency anemia, unspecified: Secondary | ICD-10-CM

## 2023-10-07 LAB — CBC
HCT: 32.6 % — ABNORMAL LOW (ref 36.0–46.0)
Hemoglobin: 10.7 g/dL — ABNORMAL LOW (ref 12.0–15.0)
MCH: 32.2 pg (ref 26.0–34.0)
MCHC: 32.8 g/dL (ref 30.0–36.0)
MCV: 98.2 fL (ref 80.0–100.0)
Platelets: 214 10*3/uL (ref 150–400)
RBC: 3.32 MIL/uL — ABNORMAL LOW (ref 3.87–5.11)
RDW: 12.9 % (ref 11.5–15.5)
WBC: 14.8 10*3/uL — ABNORMAL HIGH (ref 4.0–10.5)
nRBC: 0 % (ref 0.0–0.2)

## 2023-10-07 LAB — BLOOD GAS, ARTERIAL
Acid-Base Excess: 0.2 mmol/L (ref 0.0–2.0)
Acid-base deficit: 2.2 mmol/L — ABNORMAL HIGH (ref 0.0–2.0)
Bicarbonate: 22.6 mmol/L (ref 20.0–28.0)
Bicarbonate: 23.2 mmol/L (ref 20.0–28.0)
FIO2: 100 %
FIO2: 100 %
MECHVT: 500 mL
Mechanical Rate: 18
O2 Saturation: 89.7 %
O2 Saturation: 99.1 %
PEEP: 10 cmH2O
Patient temperature: 37
Patient temperature: 37
pCO2 arterial: 29 mmHg — ABNORMAL LOW (ref 32–48)
pCO2 arterial: 41 mmHg (ref 32–48)
pH, Arterial: 7.5 — ABNORMAL HIGH (ref 7.35–7.45)
pH, Arterial: 7.36 (ref 7.35–7.45)
pO2, Arterial: 54 mmHg — ABNORMAL LOW (ref 83–108)
pO2, Arterial: 99 mmHg (ref 83–108)

## 2023-10-07 LAB — BASIC METABOLIC PANEL WITH GFR
Anion gap: 10 (ref 5–15)
BUN: 20 mg/dL (ref 8–23)
CO2: 22 mmol/L (ref 22–32)
Calcium: 8.6 mg/dL — ABNORMAL LOW (ref 8.9–10.3)
Chloride: 106 mmol/L (ref 98–111)
Creatinine, Ser: 0.95 mg/dL (ref 0.44–1.00)
GFR, Estimated: >60 mL/min (ref >60)
Glucose, Bld: 107 mg/dL — ABNORMAL HIGH (ref 70–99)
Potassium: 3.5 mmol/L (ref 3.5–5.1)
Sodium: 138 mmol/L (ref 135–145)

## 2023-10-07 LAB — GLUCOSE, CAPILLARY
Glucose-Capillary: 126 mg/dL — ABNORMAL HIGH (ref 70–99)
Glucose-Capillary: 121 mg/dL — ABNORMAL HIGH (ref 70–99)
Glucose-Capillary: 140 mg/dL — ABNORMAL HIGH (ref 70–99)

## 2023-10-07 LAB — STREP PNEUMONIAE URINARY ANTIGEN: Strep Pneumo Urinary Antigen: NEGATIVE

## 2023-10-07 LAB — BRAIN NATRIURETIC PEPTIDE: B Natriuretic Peptide: 632.8 pg/mL — ABNORMAL HIGH (ref 0.0–100.0)

## 2023-10-07 LAB — MRSA NEXT GEN BY PCR, NASAL: MRSA by PCR Next Gen: NOT DETECTED

## 2023-10-07 MED ORDER — CHLORHEXIDINE GLUCONATE CLOTH 2 % EX PADS
6.0000 | MEDICATED_PAD | Freq: Every day | CUTANEOUS | Status: DC
  Administered 2023-10-07 – 2023-10-11 (×5): 6 via TOPICAL

## 2023-10-07 MED ORDER — MORPHINE SULFATE (PF) 2 MG/ML IV SOLN
0.5000 mg | INTRAVENOUS | Status: DC | PRN
  Administered 2023-10-07 – 2023-10-10 (×2): 0.5 mg via INTRAVENOUS
  Filled 2023-10-07 (×2): qty 1

## 2023-10-07 MED ORDER — METHYLPREDNISOLONE SODIUM SUCC 40 MG IJ SOLR
40.0000 mg | Freq: Two times a day (BID) | INTRAMUSCULAR | Status: DC
  Administered 2023-10-07 – 2023-10-10 (×7): 40 mg via INTRAVENOUS
  Filled 2023-10-07 (×7): qty 1

## 2023-10-07 MED ORDER — FENTANYL CITRATE PF 50 MCG/ML IJ SOSY: 25.0000 ug | PREFILLED_SYRINGE | INTRAMUSCULAR | Status: DC | PRN

## 2023-10-07 MED ORDER — STERILE WATER FOR INJECTION IJ SOLN
INTRAMUSCULAR | Status: AC
  Filled 2023-10-07: qty 10

## 2023-10-07 MED ORDER — RIVAROXABAN 10 MG PO TABS
10.0000 mg | ORAL_TABLET | Freq: Every day | ORAL | Status: DC
  Administered 2023-10-08 – 2023-10-10 (×2): 10 mg
  Filled 2023-10-07 (×3): qty 1

## 2023-10-07 MED ORDER — MORPHINE SULFATE (PF) 2 MG/ML IV SOLN
1.0000 mg | Freq: Once | INTRAVENOUS | Status: AC
  Administered 2023-10-07: 1 mg via INTRAVENOUS

## 2023-10-07 MED ORDER — SODIUM CHLORIDE 0.9 % IV SOLN
2.0000 g | Freq: Three times a day (TID) | INTRAVENOUS | Status: DC
  Administered 2023-10-07 – 2023-10-08 (×4): 2 g via INTRAVENOUS
  Filled 2023-10-07 (×5): qty 12.5

## 2023-10-07 MED ORDER — MIDAZOLAM HCL 2 MG/2ML IJ SOLN
4.0000 mg | Freq: Once | INTRAMUSCULAR | Status: AC
  Administered 2023-10-07: 4 mg via INTRAVENOUS
  Filled 2023-10-07: qty 4

## 2023-10-07 MED ORDER — MIDAZOLAM HCL 2 MG/2ML IJ SOLN
1.0000 mg | INTRAMUSCULAR | Status: DC | PRN
  Administered 2023-10-08: 2 mg via INTRAVENOUS
  Filled 2023-10-07: qty 2

## 2023-10-07 MED ORDER — DOCUSATE SODIUM 50 MG/5ML PO LIQD
100.0000 mg | Freq: Two times a day (BID) | ORAL | Status: DC
  Administered 2023-10-07 – 2023-10-10 (×5): 100 mg
  Filled 2023-10-07 (×6): qty 10

## 2023-10-07 MED ORDER — PROPOFOL 1000 MG/100ML IV EMUL
INTRAVENOUS | Status: AC
  Filled 2023-10-07: qty 100

## 2023-10-07 MED ORDER — OLANZAPINE 10 MG PO TABS
10.0000 mg | ORAL_TABLET | Freq: Every day | ORAL | Status: DC
Start: 2023-10-07 — End: 2023-10-11
  Administered 2023-10-07 – 2023-10-08 (×2): 10 mg
  Filled 2023-10-07 (×4): qty 1

## 2023-10-07 MED ORDER — LINEZOLID 600 MG/300ML IV SOLN
600.0000 mg | Freq: Two times a day (BID) | INTRAVENOUS | Status: DC
  Administered 2023-10-07 – 2023-10-11 (×9): 600 mg via INTRAVENOUS
  Filled 2023-10-07 (×10): qty 300

## 2023-10-07 MED ORDER — IOHEXOL 350 MG/ML SOLN
75.0000 mL | Freq: Once | INTRAVENOUS | Status: AC | PRN
  Administered 2023-10-07: 75 mL via INTRAVENOUS

## 2023-10-07 MED ORDER — ORAL CARE MOUTH RINSE: 15.0000 mL | OROMUCOSAL | Status: DC | PRN

## 2023-10-07 MED ORDER — VECURONIUM BROMIDE 10 MG IV SOLR
20.0000 mg | Freq: Once | INTRAVENOUS | Status: AC
  Administered 2023-10-07: 20 mg via INTRAVENOUS
  Filled 2023-10-07: qty 20

## 2023-10-07 MED ORDER — ACETAMINOPHEN 650 MG RE SUPP: 650.0000 mg | Freq: Four times a day (QID) | RECTAL | Status: DC | PRN

## 2023-10-07 MED ORDER — MIDAZOLAM HCL 2 MG/2ML IJ SOLN
4.0000 mg | INTRAMUSCULAR | Status: DC | PRN
Start: 2023-10-07 — End: 2023-10-07

## 2023-10-07 MED ORDER — SODIUM CHLORIDE 0.9 % IV SOLN
500.0000 mg | INTRAVENOUS | Status: DC
  Administered 2023-10-07 – 2023-10-09 (×3): 500 mg via INTRAVENOUS
  Filled 2023-10-07 (×3): qty 5

## 2023-10-07 MED ORDER — FENTANYL CITRATE PF 50 MCG/ML IJ SOSY
200.0000 ug | PREFILLED_SYRINGE | Freq: Once | INTRAMUSCULAR | Status: AC
  Administered 2023-10-07: 200 ug via INTRAVENOUS
  Filled 2023-10-07: qty 4

## 2023-10-07 MED ORDER — LABETALOL HCL 5 MG/ML IV SOLN
10.0000 mg | Freq: Once | INTRAVENOUS | Status: AC
  Administered 2023-10-07: 10 mg via INTRAVENOUS
  Filled 2023-10-07: qty 4

## 2023-10-07 MED ORDER — PANTOPRAZOLE SODIUM 40 MG IV SOLR
40.0000 mg | Freq: Every day | INTRAVENOUS | Status: DC
  Administered 2023-10-07 – 2023-10-10 (×4): 40 mg via INTRAVENOUS
  Filled 2023-10-07 (×4): qty 10

## 2023-10-07 MED ORDER — MORPHINE SULFATE (PF) 2 MG/ML IV SOLN
INTRAVENOUS | Status: AC
Start: 2023-10-07 — End: 2023-10-07
  Filled 2023-10-07: qty 1

## 2023-10-07 MED ORDER — PROPOFOL 1000 MG/100ML IV EMUL
5.0000 ug/kg/min | INTRAVENOUS | Status: DC
  Administered 2023-10-07: 5 ug/kg/min via INTRAVENOUS
  Administered 2023-10-07: 40 ug/kg/min via INTRAVENOUS
  Administered 2023-10-08: 30 ug/kg/min via INTRAVENOUS
  Administered 2023-10-08: 25 ug/kg/min via INTRAVENOUS
  Filled 2023-10-07 (×4): qty 100

## 2023-10-07 MED ORDER — NOREPINEPHRINE 4 MG/250ML-% IV SOLN
INTRAVENOUS | Status: AC
  Filled 2023-10-07: qty 250

## 2023-10-07 MED ORDER — LEVOTHYROXINE SODIUM 100 MCG PO TABS
100.0000 ug | ORAL_TABLET | Freq: Every day | ORAL | Status: DC
  Administered 2023-10-08 – 2023-10-09 (×2): 100 ug
  Filled 2023-10-07 (×2): qty 1

## 2023-10-07 MED ORDER — ROSUVASTATIN CALCIUM 10 MG PO TABS
5.0000 mg | ORAL_TABLET | Freq: Every day | ORAL | Status: DC
  Administered 2023-10-07 – 2023-10-08 (×2): 5 mg
  Filled 2023-10-07 (×2): qty 1

## 2023-10-07 MED ORDER — ACETAMINOPHEN 325 MG PO TABS: 650.0000 mg | ORAL_TABLET | Freq: Four times a day (QID) | ORAL | Status: DC | PRN

## 2023-10-07 MED ORDER — MIDAZOLAM HCL 2 MG/2ML IJ SOLN: 1.0000 mg | INTRAMUSCULAR | Status: DC | PRN

## 2023-10-07 MED ORDER — POLYETHYLENE GLYCOL 3350 17 G PO PACK
17.0000 g | PACK | Freq: Every day | ORAL | Status: DC
  Administered 2023-10-07 – 2023-10-10 (×3): 17 g
  Filled 2023-10-07 (×3): qty 1

## 2023-10-07 MED ORDER — ORAL CARE MOUTH RINSE
15.0000 mL | OROMUCOSAL | Status: DC
  Administered 2023-10-07 – 2023-10-10 (×33): 15 mL via OROMUCOSAL

## 2023-10-07 NOTE — ED Notes (Signed)
 Pt woke up and got irritated. Pts O2 sats decreased to 82%, RT called. HFNC increased to 50% FiO2 and 50L

## 2023-10-07 NOTE — ED Notes (Signed)
 of fentanyl administered now

## 2023-10-07 NOTE — Progress Notes (Signed)
 Patient admitted overnight for severe sepsis secondary to RSV pneumonia and possible aspiration in the setting of recent CVA as well as acute hypoxic respiratory failure. Patient was seen in the emergency room and noted to be on high flow nasal cannula maxed out at 100% FiO2/60L.  She appeared anxious and was tachycardic and tachypneic only maintaining sats between 88 and 91.   Physical exam General -acutely ill-appearing female, anxious Head and neck -pale conjunctiva, dry mucous membranes Cardiovascular -S1, S2, tachycardic Lungs-diffuse rhonchi in all lung fields Abdomen -bowel sounds present, nontender, nondistended Extremity-no pedal edema   Assessment and plan Ordered a stat CT angiogram for further evaluation of her hypoxia Continue to antibiotic therapy Critical care consult due to concern for worsening respiratory failure which will require intubation and mechanical ventilation  Attempted to call patient's emergency contact Left a voicemail for [employer].  Awaiting callback Unable to leave a voicemail for Steele Sizer

## 2023-10-07 NOTE — ED Notes (Addendum)
 Pt is getting agitated at this time. Pt has ripped off HFNC, purewick and cords multiple times. Pt is confused at baseline and not able to reoriented. Pt has a history of dementia. When she gets into these episodes she becomes increasingly SOB, tachypneic, and O2 sats drop. HFNC has been increased to max settings at this time. RT called. O2 sats are at 88%. MD notified via secure chat.

## 2023-10-07 NOTE — ED Notes (Signed)
 Og placed 78@lip  placed by MD Kasa

## 2023-10-07 NOTE — ED Notes (Signed)
 Xray called

## 2023-10-07 NOTE — ED Notes (Signed)
 of fentanyl given now

## 2023-10-07 NOTE — Progress Notes (Addendum)
 SLP Cancellation Note  Patient Details Name: Loriel Diehl MRN: [id number] DOB: 04-29-1956   Cancelled treatment:       Reason Eval/Treat Not Completed: Patient not medically ready  ICU MD at pt's bedside. Pt with high O2 requirements at this time.   Will assess at next available time.   Seniyah Esker B. Dreama Saa, M.S., CCC-SLP, Tree surgeon Certified Brain Injury Specialist [facility]  [facility] Office [phone] Ascom [phone] Fax [phone]

## 2023-10-07 NOTE — Procedures (Signed)
 Endotracheal Intubation: Patient required placement of an artificial airway secondary to Respiratory Failure  Consent: Emergent.   Hand washing performed prior to starting the procedure.   Medications administered for sedation prior to procedure:  Midazolam 4 mg IV,  Vecuronium 20 mg IV, Fentanyl 200 mcg IV.    A time out procedure was called and correct patient, name, & ID confirmed. Needed supplies and equipment were assembled and checked to include ETT, 10 ml syringe, Glidescope, Mac and Miller blades, suction, oxygen and bag mask valve, end tidal CO2 monitor.   Patient was positioned to align the mouth and pharynx to facilitate visualization of the glottis.   Heart rate, SpO2 and blood pressure was continuously monitored during the procedure. Pre-oxygenation was conducted prior to intubation and endotracheal tube was placed through the vocal cords into the trachea.     The artificial airway was placed under direct visualization via glidescope route using a 8.0 ETT on the first attempt.  ETT was secured at 23 cm mark.  Placement was confirmed by auscuitation of lungs with good breath sounds bilaterally and no stomach sounds.  Condensation was noted on endotracheal tube.   Pulse ox 98%.  CO2 detector in place with appropriate color change.   Complications: None .   Operator: Kacin Dancy.   Comments: OGT placed via glidescope.  Lucie Leather, M.D.  [facility]  Medical Director [facility] Medical Director [facility] Cardio-Pulmonary Department

## 2023-10-07 NOTE — ED Notes (Signed)
 4mg  of versed given now

## 2023-10-07 NOTE — ED Notes (Signed)
 This RN came to check on Pt because sats were low, upon arrival to room. Pt had ripped off her HFNC and sating in the low 80s. HFNC replaced and Fio2 increased again to 100%, 60L [place]. Sats increased to 88%, given PRN morphine for dyspnea/anxiety

## 2023-10-07 NOTE — ED Notes (Signed)
 20mg  of vec given now

## 2023-10-07 NOTE — ED Notes (Signed)
 [name], Pts contact-cousin called to get an update. Update given at this time

## 2023-10-07 NOTE — ED Notes (Signed)
 RT @bedside  to get ABG

## 2023-10-07 NOTE — ED Notes (Signed)
 Pt moved up in bed, socks applied, purwick repositioned as pt had self removed. NADN

## 2023-10-07 NOTE — Assessment & Plan Note (Signed)
 Replete and monitor

## 2023-10-07 NOTE — ED Notes (Signed)
 Family, cousin @bedside , ICU provider spoke with family @this  time

## 2023-10-07 NOTE — ED Notes (Addendum)
 8mm, 23 @lip , + color change

## 2023-10-07 NOTE — Plan of Care (Signed)
  Problem: Fluid Volume: Goal: Hemodynamic stability will improve Outcome: Progressing   Problem: Clinical Measurements: Goal: Diagnostic test results will improve Outcome: Progressing Goal: Signs and symptoms of infection will decrease Outcome: Progressing   Problem: Respiratory: Goal: Ability to maintain adequate ventilation will improve Outcome: Progressing   Problem: Role Relationship: Goal: Method of communication will improve Outcome: Progressing   Problem: Education: Goal: Knowledge of General Education information will improve Description: Including pain rating scale, medication(s)/side effects and non-pharmacologic comfort measures Outcome: Progressing   Problem: Clinical Measurements: Goal: Ability to maintain clinical measurements within normal limits will improve Outcome: Progressing Goal: Will remain free from infection Outcome: Progressing Goal: Diagnostic test results will improve Outcome: Progressing Goal: Respiratory complications will improve Outcome: Progressing Goal: Cardiovascular complication will be avoided Outcome: Progressing

## 2023-10-07 NOTE — ED Notes (Signed)
 ICU MD @ bedside

## 2023-10-07 NOTE — Consult Note (Signed)
 NAME:  Kylie [name], MRN:  [id number], DOB:  08/31/1955, LOS: 1 ADMISSION DATE:  10/06/2023, CONSULTATION DATE:  10/07/2023 REFERRING MD:  Dr. Joylene Igo, CHIEF COMPLAINT:  Acute Hypoxic Respiratory Failure   Brief Pt Description / Synopsis:  68 y.o. female with PMHx significant for PE on Xarelto, Dementia, CVA with aphasia, renal cell carcinoma s/p right Nephrectomy (12/2007), and CKD Stage III, who is  admitted with Acute Metabolic Encephalopathy, Acute Hypoxic Respiratory Failure, and Severe Sepsis due to RSV Pneumonia and suspected superimposed bacterial HCAP requiring intubation and mechanical ventilation.   History of Present Illness:  Kylie [name] is a 68 y.o. female with medical history significant for prior renal cell carcinoma s/p R nephrectomy (12/2007), HTN, and dementia, prior CVA with aphasia, diabetes and history of PE on Xarelto who presents to [facility] ED on 10/16/2023 from her skilled nursing facility due to fever, shortness of breath, and increased confusion from baseline.  Patient is currently altered and unable to contribute to history and no family is available, therefore history is obtained from H&P from Hospitalist.  Is was reported she was sent over due to the above symptoms.  Upon EMS arrival she was noted to be hypoxic with O2 saturations of 70% on room air which she was placed on CPAP for transport.  Upon arrival to the ED was transitioned over to BiPAP.  Of note she was recently hospitalized at [facility] from 08/02/2023 through 08/05/2023 due to fall with traumatic rhabdomyolysis.  ED Course: Initial Vital Signs: Temperature 102.2 F, pulse 119, respiratory rate 33, blood pressure 154/87, SpO2 100% on BiPAP Significant Labs: WBC 17, lactic acid 2.1, potassium 3, creatinine 1.14 (near baseline), hemoglobin 11 (near baseline) RSV positive UA showing moderate leuks and rare bacteria Imaging Chest X-ray>>IMPRESSION: 1. Cardiomegaly with mild central congestion. 2. Heterogeneous  asymmetrical airspace disease in the left thorax, suspect for pneumonia. CT angio Chest>>IMPRESSION: 1. No embolism to the proximal subsegmental pulmonary artery level. 2. Diffuse heterogeneous opacities throughout bilateral lungs with areas of smooth interlobular and intra lobular septal thickening. No pleural effusion. Findings favor multilobar pneumonia on the background of pulmonary edema. Correlate clinically. Follow-up to clearing is recommended. The 3. Multiple other nonacute observations, as described above. Aortic Atherosclerosis (ICD10-I70.0). Medications Administered: Normal saline bolus, IV ceftriaxone and azithromycin  Due to poor tolerance of BiPAP, she was transitioned to heated high flow nasal cannula at 40 L/min with 50% FiO2.  TRH asked to admit for further workup and treatment.  Please see "Significant Hospital Events" section below for full detailed hospital course.   Pertinent  Medical History   Past Medical History:  Diagnosis Date   Arthritis    CAD in native artery    CKD (chronic kidney disease), stage III (HCC)    Dementia (HCC)    Depression    Hypertension    Hypothyroidism    Pulmonary embolism (HCC) 2014   Renal cell carcinoma of right kidney (HCC) 2009   Sleep apnea    Solitary kidney, acquired 2009   right kidney removed   Stroke [facility])    Type 2 diabetes mellitus (HCC)    no current medications    Micro Data:  4/9: COVID/RSV/influenza PCR>> + RSV 4/9: Blood culture x 2>> no growth to date 4/9: Urine>> 4/10: MRSA PCR>> negative 4/10: Tracheal aspirate>> 4/10: Strep pneumo urine antigen>> 4/10: Legionella urine antigen>>  Antimicrobials:   Anti-infectives (From admission, onward)    Start     Dose/Rate Route Frequency Ordered Stop  10/07/23 2200  azithromycin (ZITHROMAX) 500 mg in sodium chloride 0.9 % 250 mL IVPB        500 mg 250 mL/hr over 60 Minutes Intravenous Every 24 hours 10/07/23 0950 10/11/23 2159   10/07/23 2000   cefTRIAXone (ROCEPHIN) 2 g in sodium chloride 0.9 % 100 mL IVPB  Status:  Discontinued        2 g 200 mL/hr over 30 Minutes Intravenous Every 24 hours 10/06/23 2250 10/07/23 0933   10/07/23 2000  azithromycin (ZITHROMAX) 500 mg in sodium chloride 0.9 % 250 mL IVPB  Status:  Discontinued        500 mg 250 mL/hr over 60 Minutes Intravenous Every 24 hours 10/06/23 2250 10/07/23 0933   10/07/23 1200  ceFEPIme (MAXIPIME) 2 g in sodium chloride 0.9 % 100 mL IVPB        2 g 200 mL/hr over 30 Minutes Intravenous Every 8 hours 10/07/23 0933     10/07/23 1000  linezolid (ZYVOX) IVPB 600 mg        600 mg 300 mL/hr over 60 Minutes Intravenous Every 12 hours 10/07/23 0933     10/06/23 1945  cefTRIAXone (ROCEPHIN) 2 g in sodium chloride 0.9 % 100 mL IVPB        2 g 200 mL/hr over 30 Minutes Intravenous Once 10/06/23 1935 10/06/23 2027   10/06/23 1945  azithromycin (ZITHROMAX) 500 mg in sodium chloride 0.9 % 250 mL IVPB        500 mg 250 mL/hr over 60 Minutes Intravenous  Once 10/06/23 1935 10/06/23 2126       Significant Hospital Events: Including procedures, antibiotic start and stop dates in addition to other pertinent events   4/9: Admitted by [facility] for acute hypoxic respiratory failure and severe sepsis due to RSV pneumonia and suspected superimposed bacterial pneumonia. 4/10: Increasing FiO2 requirements and increased work of breathing.  PCCM consulted.  Ultimately required intubation and mechanical ventilation.  Critically ill.  Interim History / Subjective:  As outlined above under "Significant Hospital Events" section  Objective   Blood pressure (!) 183/99, pulse 94, temperature 98.8 F (37.1 C), temperature source Oral, resp. rate (!) 32, height 5\' 10"  (1.778 m), weight 86 kg, SpO2 92%.    FiO2 (%):  [30 %-100 %] 90 %   Intake/Output Summary (Last 24 hours) at 10/07/2023 0944 Last data filed at 10/07/2023 0700 Gross per 24 hour  Intake 992.52 ml  Output 700 ml  Net 292.52 ml   Filed  Weights   10/06/23 2028  Weight: 86 kg    Examination: General: Critically ill-appearing female, laying in bed, on heated high flow nasal cannula with moderate respiratory distress HENT: Atraumatic, normocephalic, neck supple, no JVD Lungs: Coarse rhonchi throughout, even, tachypneic, increased work of breathing accessory muscle use Cardiovascular: Tachycardic, regular rhythm, S1-S2, no murmurs, rubs, gallops Abdomen: Soft, nontender, nondistended, no guarding or rebound tenderness, bowel sounds positive x 4 Extremities: Generalized weakness, no deformities, 1+ edema bilateral lower extremities, no cyanosis, good peripheral perfusion Neuro: Awake and alert, oriented only to self, moves all extremities purposely, no focal deficits noted, pupils PERRLA GU: Deferred  Resolved Hospital Problem list     Assessment & Plan:   #Acute Hypoxic Respiratory Failure in the setting of ... #RSV Pneumonia #Questionable superimposed bacterial pneumonia PMHx: Pulmonary Embolism, OSA -CTa Chest 4/10 negative for PE, with diffuse bilateral opacities concerning for multifocal pneumonia +/-  pulmonary edema -Full vent support, implement lung protective strategies -Plateau pressures less than  30 cm H20 -Wean FiO2 & PEEP as tolerated to maintain O2 sats >92% -Follow intermittent Chest X-ray & ABG as needed -Spontaneous Breathing Trials when respiratory parameters met and mental status permits -Implement VAP Bundle -Bronchodilators prn -IV Steroids -ABX as above -Diuresis as BP and renal function permits -Pulmonary toilet as able -Currently on Xarelto, may need to transition to heparin drip for [facility]  #Met SIRS Criteria on presentation (WBC 14.8, Temperature 102.2 F, pulse 119, respiratory rate 33) #Severe Sepsis due to ... #RSV Pneumonia #Suspected superimposed Bacterial Pneumonia (HCAP) -Monitor fever curve -Trend WBC's & Procalcitonin -Follow cultures as above -Continue empiric Azithromycin,  Cefepime, Linezolid pending cultures & sensitivities  #Normocytic Normochromic Anemia without s/sx of bleeding -Monitor for S/Sx of bleeding -Trend CBC -Xarelto for AC/VTE Prophylaxis  -Transfuse for Hgb <7  #Diabetes Mellitus Type II -CBG's q4h; Target range of 140 to 180 -SSI -Follow ICU Hypo/Hyperglycemia protocol  #Acute Metabolic Encephalopathy #Sedation needs in setting of mechanical ventilation PMHx: Stroke, Dementia, Depression -Treatment of metabolic derangements and sepsis as outlined above -Maintain a RASS goal of 0 to -1 -Propofol as needed to maintain RASS goal -Avoid sedating medications as able -Daily wake up assessment     Pt is critically ill with multiorgan failure. Prognosis is guarded, high risk for further decompensation, cardiac arrest, and death.   Given current critical illness superimposed on multiple chronic co-morbidities and advanced age, overall long term prognosis is poor.  Recommend consideration of DNR/DNI status.   Best Practice (right click and "Reselect all SmartList Selections" daily)   Diet/type: NPO DVT prophylaxis: DOAC GI prophylaxis: PPI Lines: N/A Foley:  N/A Code Status:  full code Last date of multidisciplinary goals of care discussion [N/A]  4/10: Pt's cousin [name] at bedside on plan of care.  Pt's brother Gardiner [facility]) updated via telephone conference.  Labs   CBC: Recent Labs  Lab 10/06/23 1939 10/07/23 0510  WBC 17.4* 14.8*  NEUTROABS 15.5*  --   HGB 11.1* 10.7*  HCT 34.7* 32.6*  MCV 101.5* 98.2  PLT 227 214    Basic Metabolic Panel: Recent Labs  Lab 10/06/23 1939 10/07/23 0510  NA 137 138  K 3.0* 3.5  CL 106 106  CO2 22 22  GLUCOSE 187* 107*  BUN 23 20  CREATININE 1.14* 0.95  CALCIUM 8.7* 8.6*   GFR: Estimated Creatinine Clearance: 65.7 mL/min (by C-G formula based on SCr of 0.95 mg/dL). Recent Labs  Lab 10/06/23 1939 10/06/23 2202 10/07/23 0510  PROCALCITON 0.33  --   --   WBC 17.4*  --   14.8*  LATICACIDVEN 2.4* 1.1  --     Liver Function Tests: Recent Labs  Lab 10/06/23 1939  AST 34  ALT 21  ALKPHOS 63  BILITOT 1.1  PROT 6.7  ALBUMIN 3.4*   No results for input(s): "LIPASE", "AMYLASE" in the last 168 hours. No results for input(s): "AMMONIA" in the last 168 hours.  ABG    Component Value Date/Time   PHART 7.5 (H) 10/07/2023 0900   PCO2ART 29 (L) 10/07/2023 0900   PO2ART 54 (L) 10/07/2023 0900   HCO3 22.6 10/07/2023 0900   O2SAT 89.7 10/07/2023 0900     Coagulation Profile: Recent Labs  Lab 10/06/23 1939  INR 1.2    Cardiac Enzymes: No results for input(s): "CKTOTAL", "CKMB", "CKMBINDEX", "TROPONINI" in the last 168 hours.  HbA1C: Hgb A1c MFr Bld  Date/Time Value Ref Range Status  08/03/2023 06:44 AM 5.4 4.8 - 5.6 % Final  Comment:    (NOTE) Pre diabetes:          5.7%-6.4%  Diabetes:              >6.4%  Glycemic control for   <7.0% adults with diabetes     CBG: No results for input(s): "GLUCAP" in the last 168 hours.  Review of Systems:   Positives in BOLD: Gen: Denies fever, chills, weight change, fatigue, night sweats HEENT: Denies blurred vision, double vision, hearing loss, tinnitus, sinus congestion, rhinorrhea, sore throat, neck stiffness, dysphagia PULM: Denies shortness of breath, cough, sputum production, hemoptysis, wheezing CV: Denies chest pain, edema, orthopnea, paroxysmal nocturnal dyspnea, palpitations GI: Denies abdominal pain, nausea, vomiting, diarrhea, hematochezia, melena, constipation, change in bowel habits GU: Denies dysuria, hematuria, polyuria, oliguria, urethral discharge Endocrine: Denies hot or cold intolerance, polyuria, polyphagia or appetite change Derm: Denies rash, dry skin, scaling or peeling skin change Heme: Denies easy bruising, bleeding, bleeding gums Neuro: Denies headache, numbness, weakness, slurred speech, loss of memory or consciousness   Past Medical History:  She,  has a past  medical history of Arthritis, CAD in native artery, CKD (chronic kidney disease), stage III (HCC), Dementia (HCC), Depression, Hypertension, Hypothyroidism, Pulmonary embolism (HCC) (2014), Renal cell carcinoma of right kidney (HCC) (2009), Sleep apnea, Solitary kidney, acquired (2009), Stroke [facility]), and Type 2 diabetes mellitus (HCC).   Surgical History:   Past Surgical History:  Procedure Laterality Date   CATARACT EXTRACTION W/PHACO Left 01/13/2023   Procedure: CATARACT EXTRACTION PHACO AND INTRAOCULAR LENS PLACEMENT (IOC) LEFT VISION BLUE HEALON 5 13.28 01:06.8;  Surgeon: Lockie Mola, MD;  Location: [facility];  Service: Ophthalmology;  Laterality: Left;   NEPHRECTOMY Right 2009   Rt kidney removal Right      Social History:   reports that she has never smoked. She has never used smokeless tobacco. She reports that she does not drink alcohol and does not use drugs.   Family History:  Her family history is not on file.   Allergies Allergies  Allergen Reactions   Aspirin     On warfarin   Bee Venom    Lisinopril Cough   Metformin Other (See Comments)    Nausea with significant weight loss   Spironolactone     Hyperkalemia   Wasp Venom Protein      Home Medications  Prior to Admission medications   Medication Sig Start Date End Date Taking? Authorizing Provider  acetaminophen (TYLENOL) 650 MG CR tablet Take 1,300 mg by mouth in the morning and at bedtime.   Yes [provider]  amLODipine (NORVASC) 10 MG tablet Take 10 mg by mouth daily.   Yes [provider]  Carboxymethylcellul-Glycerin (REFRESH OPTIVE OP) Place 1 drop into both eyes in the morning and at bedtime.   Yes [provider]  cloNIDine (CATAPRES-TTS-2) 0.2 mg/24hr patch Place 0.2 mg onto the skin once a week. Wednesdays   Yes [provider]  DULoxetine (CYMBALTA) 30 MG capsule Take 30 mg by mouth daily.   Yes [provider]  furosemide (LASIX) 40 MG  tablet Take 40 mg by mouth daily. 08/30/23  Yes [provider]  gabapentin (NEURONTIN) 100 MG capsule Take 100 mg by mouth 2 (two) times daily. 09/24/23  Yes [provider]  levothyroxine (SYNTHROID, LEVOTHROID) 100 MCG tablet TAKE 1 TABLET BY MOUTH EVERY MORNING 09/23/16  Yes [provider]  lidocaine (XYLOCAINE) 4 % external solution Apply topically as needed (both knees).   Yes  [provider]  losartan (COZAAR) 100 MG tablet Take 100 mg by mouth daily.   Yes [provider]  meloxicam (MOBIC) 7.5 MG tablet Take 7.5 mg by mouth daily.   Yes [provider]  OLANZapine (ZYPREXA) 10 MG tablet Take 10 mg by mouth daily.   Yes [provider]  rivaroxaban (XARELTO) 10 MG TABS tablet Take 10 mg by mouth daily.   Yes [provider]  rosuvastatin (CRESTOR) 5 MG tablet Take 5 mg by mouth at bedtime.   Yes [provider]  trolamine salicylate (ASPERCREME) 10 % cream Apply 1 Application topically 3 (three) times daily as needed for muscle pain (knee pain).   Yes [provider]  vitamin D3 (CHOLECALCIFEROL) 25 MCG tablet Take 1,000 Units by mouth daily.   Yes [provider]  EPINEPHrine 0.3 mg/0.3 mL IJ SOAJ injection Inject 0.3 mg into the muscle. Patient not taking: Reported on 01/06/2023 11/24/16   [provider]     Critical care time: 55 minutes     Harlon Ditty, AGACNP-BC [facility] Prefer epic messenger for cross cover needs If after hours, please call E-link

## 2023-10-07 NOTE — ED Notes (Signed)
 Report given to ICU

## 2023-10-07 NOTE — ED Notes (Signed)
 Admitting MD bedside

## 2023-10-08 ENCOUNTER — Inpatient Hospital Stay
Admit: 2023-10-08 | Discharge: 2023-10-08 | Disposition: A | Discharge status: Home / Self Care | Payer: Medicare (Managed Care) | Attending: Pulmonary Disease | Admitting: Pulmonary Disease

## 2023-10-08 ENCOUNTER — Inpatient Hospital Stay: Payer: Medicare (Managed Care)

## 2023-10-08 DIAGNOSIS — A419 Sepsis, unspecified organism: Secondary | ICD-10-CM | POA: Diagnosis not present

## 2023-10-08 DIAGNOSIS — J9601 Acute respiratory failure with hypoxia: Secondary | ICD-10-CM | POA: Diagnosis not present

## 2023-10-08 DIAGNOSIS — J121 Respiratory syncytial virus pneumonia: Secondary | ICD-10-CM

## 2023-10-08 DIAGNOSIS — N179 Acute kidney failure, unspecified: Secondary | ICD-10-CM

## 2023-10-08 DIAGNOSIS — R0603 Acute respiratory distress: Secondary | ICD-10-CM

## 2023-10-08 LAB — MAGNESIUM: Magnesium: 1.7 mg/dL (ref 1.7–2.4)

## 2023-10-08 LAB — ECHOCARDIOGRAM COMPLETE
AR max vel: 2.01 cm2
AV Area VTI: 2.12 cm2
AV Area mean vel: 2.26 cm2
AV Mean grad: 5 mmHg
AV Peak grad: 11.8 mmHg
Ao pk vel: 1.72 m/s
Area-P 1/2: 1.86 cm2
Height: 70 in
MV VTI: 1.54 cm2
S' Lateral: 3.1 cm
Weight: 3008.84 [oz_av]

## 2023-10-08 LAB — CBC
HCT: 36.8 % (ref 36.0–46.0)
Hemoglobin: 11.5 g/dL — ABNORMAL LOW (ref 12.0–15.0)
MCH: 32.7 pg (ref 26.0–34.0)
MCHC: 31.3 g/dL (ref 30.0–36.0)
MCV: 104.5 fL — ABNORMAL HIGH (ref 80.0–100.0)
Platelets: 199 10*3/uL (ref 150–400)
RBC: 3.52 MIL/uL — ABNORMAL LOW (ref 3.87–5.11)
RDW: 13.1 % (ref 11.5–15.5)
WBC: 20.4 10*3/uL — ABNORMAL HIGH (ref 4.0–10.5)
nRBC: 0 % (ref 0.0–0.2)

## 2023-10-08 LAB — RENAL FUNCTION PANEL
Albumin: 2.9 g/dL — ABNORMAL LOW (ref 3.5–5.0)
Anion gap: 12 (ref 5–15)
BUN: 21 mg/dL (ref 8–23)
CO2: 20 mmol/L — ABNORMAL LOW (ref 22–32)
Calcium: 8.5 mg/dL — ABNORMAL LOW (ref 8.9–10.3)
Chloride: 105 mmol/L (ref 98–111)
Creatinine, Ser: 1.23 mg/dL — ABNORMAL HIGH (ref 0.44–1.00)
GFR, Estimated: 47 mL/min — ABNORMAL LOW (ref >60)
Glucose, Bld: 160 mg/dL — ABNORMAL HIGH (ref 70–99)
Phosphorus: 4.8 mg/dL — ABNORMAL HIGH (ref 2.5–4.6)
Potassium: 3.8 mmol/L (ref 3.5–5.1)
Sodium: 137 mmol/L (ref 135–145)

## 2023-10-08 LAB — GLUCOSE, CAPILLARY
Glucose-Capillary: 134 mg/dL — ABNORMAL HIGH (ref 70–99)
Glucose-Capillary: 140 mg/dL — ABNORMAL HIGH (ref 70–99)
Glucose-Capillary: 154 mg/dL — ABNORMAL HIGH (ref 70–99)
Glucose-Capillary: 206 mg/dL — ABNORMAL HIGH (ref 70–99)
Glucose-Capillary: 215 mg/dL — ABNORMAL HIGH (ref 70–99)
Glucose-Capillary: 242 mg/dL — ABNORMAL HIGH (ref 70–99)

## 2023-10-08 LAB — URINE CULTURE: Culture: >=100000 — AB

## 2023-10-08 MED ORDER — FUROSEMIDE 10 MG/ML IJ SOLN
40.0000 mg | Freq: Once | INTRAMUSCULAR | Status: AC
  Administered 2023-10-08: 40 mg via INTRAVENOUS
  Filled 2023-10-08: qty 4

## 2023-10-08 MED ORDER — SODIUM CHLORIDE 0.9 % IV SOLN
2.0000 g | Freq: Two times a day (BID) | INTRAVENOUS | Status: DC
  Administered 2023-10-09 – 2023-10-11 (×5): 2 g via INTRAVENOUS
  Filled 2023-10-08 (×6): qty 12.5

## 2023-10-08 MED ORDER — DEXMEDETOMIDINE HCL IN NACL 400 MCG/100ML IV SOLN
0.0000 ug/kg/h | INTRAVENOUS | Status: DC
Start: 2023-10-08 — End: 2023-10-10
  Administered 2023-10-08: 0.4 ug/kg/h via INTRAVENOUS
  Administered 2023-10-08: 0.5 ug/kg/h via INTRAVENOUS
  Filled 2023-10-08 (×2): qty 100

## 2023-10-08 MED ORDER — INSULIN ASPART 100 UNIT/ML IJ SOLN
0.0000 [IU] | INTRAMUSCULAR | Status: DC
  Administered 2023-10-08 (×2): 7 [IU] via SUBCUTANEOUS
  Administered 2023-10-09 – 2023-10-10 (×5): 3 [IU] via SUBCUTANEOUS
  Administered 2023-10-10: 4 [IU] via SUBCUTANEOUS
  Administered 2023-10-10: 7 [IU] via SUBCUTANEOUS
  Filled 2023-10-08 (×10): qty 1

## 2023-10-08 NOTE — Plan of Care (Signed)
  Problem: Fluid Volume: Goal: Hemodynamic stability will improve Outcome: Progressing   Problem: Fluid Volume: Goal: Hemodynamic stability will improve Outcome: Progressing   Problem: Clinical Measurements: Goal: Diagnostic test results will improve Outcome: Progressing Goal: Signs and symptoms of infection will decrease Outcome: Progressing   Problem: Clinical Measurements: Goal: Diagnostic test results will improve Outcome: Progressing   Problem: Clinical Measurements: Goal: Signs and symptoms of infection will decrease Outcome: Progressing   Problem: Respiratory: Goal: Ability to maintain adequate ventilation will improve Outcome: Progressing   Problem: Respiratory: Goal: Ability to maintain adequate ventilation will improve Outcome: Progressing   Problem: Respiratory: Goal: Ability to maintain a clear airway and adequate ventilation will improve Outcome: Progressing   Problem: Clinical Measurements: Goal: Will remain free from infection Outcome: Progressing Goal: Respiratory complications will improve Outcome: Progressing

## 2023-10-08 NOTE — TOC Initial Note (Signed)
 Transition of Care [facility]) - Initial/Assessment Note    Patient Details  Name: Kylie [name] MRN: [id number] Date of Birth: 1956-03-16  Transition of Care [facility]) CM/SW Contact:    Garret Reddish, RN Phone Number: 10/08/2023, 11:07 AM  Clinical Narrative:                 Chart reviewed. Noted that patient was admitted with Sepsis due to Pneumonia. Patient is currently intubated and sedated.    I have spoken with Insurance risk surveyor at [facility].  She informs me that patient is apart of the [facility] program.  She informs me that patient was long-term care resident of [facility].  She informs me that prior to admission patient was independent of feeding herself.  She informs me that patient had left-sided  hemiparesis.  She required maximum assistance for bathing and dressing.  Patient had been able to get around with a [place] but was having pain when walking.  Patient was getting around in a wheelchair at the facility.    I have spoken with Gavin Pound, Admission Coordinator at [facility] and she confirms that patient is a long-term care resident at [facility] via the [employer]  She reports that patient is able to return on discharge.    TOC will continue to follow for discharge planning.    Expected Discharge Plan: Long Term Nursing Home Barriers to Discharge: No Barriers Identified, Continued Medical Work up   Patient Goals and CMS Choice            Expected Discharge Plan and Services                                              Prior Living Arrangements/Services     Patient language and need for interpreter reviewed:: Yes                 Activities of Daily Living      Permission Sought/Granted                  Emotional Assessment Appearance:: Appears stated age Attitude/Demeanor/Rapport: Unable to Assess (Patient in intubated)          Admission diagnosis:  RSV (acute bronchiolitis due to respiratory syncytial virus) [J21.0] Sepsis  due to pneumonia (HCC) [J18.9, A41.9] Sepsis with acute hypoxic respiratory failure without septic shock, due to unspecified organism (HCC) [A41.9, R65.20, J96.01] Patient Active Problem List   Diagnosis Date Noted   Hypokalemia 10/07/2023   Sepsis, severe, due to pneumonia (HCC) 10/06/2023   RSV (respiratory syncytial virus pneumonia) 10/06/2023   Acute respiratory failure with hypoxia (HCC) 10/06/2023   Rhabdomyolysis, traumatic 08/03/2023   Hypoglycemia 08/03/2023   S/p R nephrectomy for RCC 08/03/2023   History of pulmonary embolism 08/03/2023   History of CVA (cerebrovascular accident) 08/03/2023   Dementia (HCC)    Chronic anticoagulation due to history of pulmonary embolism 05/25/2017   CVA (cerebral vascular accident) (HCC) 05/25/2017   Hyperlipidemia 05/25/2017   Obesity (BMI 30.0-34.9) 10/22/2016   Type 2 diabetes mellitus without complication, without long-term current use of insulin (HCC) 11/14/2014   Acquired hypothyroidism 02/19/2014   Allergic rhinitis 02/19/2014   Neuropathy 02/19/2014   Primary osteoarthritis of both knees 02/19/2014   Severe episode of recurrent major depressive disorder, without psychotic features (HCC) 02/19/2014   Vitamin B 12 deficiency 02/19/2014  Vitamin D deficiency 02/19/2014   PE (pulmonary thromboembolism) (HCC) 10/03/2012   Malignant neoplasm of kidney (HCC) 03/21/2012   Gallstones without obstruction of gallbladder 05/08/2009   Sleep apnea 07/05/2000   Essential hypertension 12/06/1996   PCP:  [facility]:   [facility] [address] [place], [place] - [address] [place] [address] Phone: [phone] Fax: [phone]  [id number] [place], [place] - [address] AT [facility] OF [address] [place] [address] Phone: [phone] Fax: [phone]     Social Drivers of Health (SDOH) Social History: SDOH Screenings   Food  Insecurity: Patient Unable To Answer (08/03/2023)  Housing: Patient Unable To Answer (08/03/2023)  Transportation Needs: Patient Unable To Answer (08/03/2023)  Utilities: Patient Unable To Answer (08/03/2023)  Financial Resource Strain: Low Risk  (09/05/2020)   Received from [facility], [facility]  Physical Activity: Inactive (03/04/2021)   Received from [facility], [facility]  Social Connections: Patient Unable To Answer (08/03/2023)  Tobacco Use: Low Risk  (10/07/2023)  Health Literacy: High Risk (01/21/2021)   Received from [facility], [facility]   SDOH Interventions:     Readmission Risk Interventions     No data to display

## 2023-10-08 NOTE — IPAL (Signed)
  Interdisciplinary Goals of Care Family Meeting   Date carried out: 10/08/2023  Location of the meeting: Bedside  Member's involved: Physician, Bedside Registered Nurse, and Family Member or next of kin    GOALS OF CARE DISCUSSION  The Clinical status was relayed to family in detail- Cousin at bedside  Updated and notified of patients medical condition- Explained to family course of therapy and the modalities   PATIENT REMAINS FULL CODE  Family understands the situation. Continue Vent support Continue medications as prescribed  Family are satisfied with Plan of action and management. All questions answered  Additional CC time 25 mins   Kylie [name] Santiago Glad, M.D.  [facility]  Medical Director [facility] Medical Director [facility] Cardio-Pulmonary Department

## 2023-10-08 NOTE — Progress Notes (Signed)
*  PRELIMINARY RESULTS* Echocardiogram 2D Echocardiogram has been performed.  Carolyne Fiscal 10/08/2023, 3:31 PM

## 2023-10-08 NOTE — Plan of Care (Signed)
  Problem: Fluid Volume: Goal: Hemodynamic stability will improve Outcome: Progressing   Problem: Clinical Measurements: Goal: Diagnostic test results will improve Outcome: Progressing Goal: Signs and symptoms of infection will decrease Outcome: Progressing   Problem: Respiratory: Goal: Ability to maintain adequate ventilation will improve Outcome: Progressing   Problem: Respiratory: Goal: Ability to maintain a clear airway and adequate ventilation will improve Outcome: Progressing   Problem: Role Relationship: Goal: Method of communication will improve Outcome: Progressing   Problem: Education: Goal: Knowledge of General Education information will improve Description: Including pain rating scale, medication(s)/side effects and non-pharmacologic comfort measures Outcome: Progressing   Problem: Health Behavior/Discharge Planning: Goal: Ability to manage health-related needs will improve Outcome: Progressing   Problem: Clinical Measurements: Goal: Ability to maintain clinical measurements within normal limits will improve Outcome: Progressing Goal: Will remain free from infection Outcome: Progressing Goal: Diagnostic test results will improve Outcome: Progressing Goal: Respiratory complications will improve Outcome: Progressing   Problem: Coping: Goal: Level of anxiety will decrease Outcome: Progressing   Problem: Pain Managment: Goal: General experience of comfort will improve and/or be controlled Outcome: Progressing   Problem: Safety: Goal: Ability to remain free from injury will improve Outcome: Progressing   Problem: Skin Integrity: Goal: Risk for impaired skin integrity will decrease Outcome: Progressing

## 2023-10-08 NOTE — Progress Notes (Signed)
 NAME:  Kylie [name], MRN:  [id number], DOB:  22-Jan-1956, LOS: 2 ADMISSION DATE:  10/06/2023, CONSULTATION DATE:  10/07/2023 REFERRING MD:  Dr. Joylene Igo, CHIEF COMPLAINT:  Acute Hypoxic Respiratory Failure   Brief Pt Description / Synopsis:  68 y.o. female with PMHx significant for PE on Xarelto, Dementia, CVA with aphasia, renal cell carcinoma s/p right Nephrectomy (12/2007), and CKD Stage III, who is  admitted with Acute Metabolic Encephalopathy, Acute Hypoxic Respiratory Failure, and Severe Sepsis due to RSV Pneumonia and suspected superimposed bacterial HCAP, questionable new onset CHF, along with E. Coli UTI.  Requiring intubation and mechanical ventilation.   History of Present Illness:  Kylie [name] is a 68 y.o. female with medical history significant for prior renal cell carcinoma s/p R nephrectomy (12/2007), HTN, and dementia, prior CVA with aphasia, diabetes and history of PE on Xarelto who presents to [facility] ED on 10/16/2023 from her skilled nursing facility due to fever, shortness of breath, and increased confusion from baseline.  Patient is currently altered and unable to contribute to history and no family is available, therefore history is obtained from H&P from Hospitalist.  Is was reported she was sent over due to the above symptoms.  Upon EMS arrival she was noted to be hypoxic with O2 saturations of 70% on room air which she was placed on CPAP for transport.  Upon arrival to the ED was transitioned over to BiPAP.  Of note she was recently hospitalized at [facility] from 08/02/2023 through 08/05/2023 due to fall with traumatic rhabdomyolysis.  ED Course: Initial Vital Signs: Temperature 102.2 F, pulse 119, respiratory rate 33, blood pressure 154/87, SpO2 100% on BiPAP Significant Labs: WBC 17, lactic acid 2.1, potassium 3, creatinine 1.14 (near baseline), hemoglobin 11 (near baseline) RSV positive UA showing moderate leuks and rare bacteria Imaging Chest X-ray>>IMPRESSION: 1. Cardiomegaly  with mild central congestion. 2. Heterogeneous asymmetrical airspace disease in the left thorax, suspect for pneumonia. CT angio Chest>>IMPRESSION: 1. No embolism to the proximal subsegmental pulmonary artery level. 2. Diffuse heterogeneous opacities throughout bilateral lungs with areas of smooth interlobular and intra lobular septal thickening. No pleural effusion. Findings favor multilobar pneumonia on the background of pulmonary edema. Correlate clinically. Follow-up to clearing is recommended. The 3. Multiple other nonacute observations, as described above. Aortic Atherosclerosis (ICD10-I70.0). Medications Administered: Normal saline bolus, IV ceftriaxone and azithromycin  Due to poor tolerance of BiPAP, she was transitioned to heated high flow nasal cannula at 40 L/min with 50% FiO2.  TRH asked to admit for further workup and treatment.  Please see "Significant Hospital Events" section below for full detailed hospital course.   Pertinent  Medical History   Past Medical History:  Diagnosis Date   Arthritis    CAD in native artery    CKD (chronic kidney disease), stage III (HCC)    Dementia (HCC)    Depression    Hypertension    Hypothyroidism    Pulmonary embolism (HCC) 2014   Renal cell carcinoma of right kidney (HCC) 2009   Sleep apnea    Solitary kidney, acquired 2009   right kidney removed   Stroke [facility])    Type 2 diabetes mellitus (HCC)    no current medications    Micro Data:  4/9: COVID/RSV/influenza PCR>> + RSV 4/9: Blood culture x 2>> no growth to date 4/9: Urine>> E. Coli 4/10: MRSA PCR>> negative 4/10: Tracheal aspirate>> 4/10: Strep pneumo urine antigen>> negative 4/10: Legionella urine antigen>>  Antimicrobials:   Anti-infectives (From admission, onward)  Start     Dose/Rate Route Frequency Ordered Stop   10/07/23 2200  azithromycin (ZITHROMAX) 500 mg in sodium chloride 0.9 % 250 mL IVPB        500 mg 250 mL/hr over 60 Minutes Intravenous  Every 24 hours 10/07/23 0950 10/11/23 2159   10/07/23 2000  cefTRIAXone (ROCEPHIN) 2 g in sodium chloride 0.9 % 100 mL IVPB  Status:  Discontinued        2 g 200 mL/hr over 30 Minutes Intravenous Every 24 hours 10/06/23 2250 10/07/23 0933   10/07/23 2000  azithromycin (ZITHROMAX) 500 mg in sodium chloride 0.9 % 250 mL IVPB  Status:  Discontinued        500 mg 250 mL/hr over 60 Minutes Intravenous Every 24 hours 10/06/23 2250 10/07/23 0933   10/07/23 1200  ceFEPIme (MAXIPIME) 2 g in sodium chloride 0.9 % 100 mL IVPB        2 g 200 mL/hr over 30 Minutes Intravenous Every 8 hours 10/07/23 0933     10/07/23 1000  linezolid (ZYVOX) IVPB 600 mg        600 mg 300 mL/hr over 60 Minutes Intravenous Every 12 hours 10/07/23 0933     10/06/23 1945  cefTRIAXone (ROCEPHIN) 2 g in sodium chloride 0.9 % 100 mL IVPB        2 g 200 mL/hr over 30 Minutes Intravenous Once 10/06/23 1935 10/06/23 2027   10/06/23 1945  azithromycin (ZITHROMAX) 500 mg in sodium chloride 0.9 % 250 mL IVPB        500 mg 250 mL/hr over 60 Minutes Intravenous  Once 10/06/23 1935 10/06/23 2126       Significant Hospital Events: Including procedures, antibiotic start and stop dates in addition to other pertinent events   4/9: Admitted by [facility] for acute hypoxic respiratory failure and severe sepsis due to RSV pneumonia and suspected superimposed bacterial pneumonia. 4/10: Increasing FiO2 requirements and increased work of breathing.  PCCM consulted.  Ultimately required intubation and mechanical ventilation.  Critically ill. 4/11: No significant events noted overnight.  Vent support weaned to 40% FiO2 and 5 PEEP.  Not requiring vasopressors.  Urine culture resulted with E. Coli.  Diurese with Lasix 40 mg x1 dose.  Echocardiogram pending.  Interim History / Subjective:  As outlined above under "Significant Hospital Events" section  Objective   Blood pressure 134/76, pulse 65, temperature 98.9 F (37.2 C), temperature source Oral,  resp. rate 18, height 5\' 10"  (1.778 m), weight 85.3 kg, SpO2 100%.    Vent Mode: PRVC FiO2 (%):  [40 %-100 %] 40 % Set Rate:  [18 bmp] 18 bmp Vt Set:  [500 mL] 500 mL PEEP:  [5 cmH20-10 cmH20] 5 cmH20 Plateau Pressure:  [17 cmH20] 17 cmH20   Intake/Output Summary (Last 24 hours) at 10/08/2023 0804 Last data filed at 10/08/2023 0600 Gross per 24 hour  Intake 1261.86 ml  Output 700 ml  Net 561.86 ml   Filed Weights   10/06/23 2028 10/07/23 1505  Weight: 86 kg 85.3 kg    Examination: General: Critically ill-appearing female, laying in bed, intubated, lightly sedated, in NAD HENT: Atraumatic, normocephalic, neck supple, no JVD Lungs: Coarse rhonchi throughout, even, occasionally overbreathing the vent Cardiovascular: Regular rate and rhythm, S1-S2, no murmurs, rubs, gallops Abdomen: Soft, nontender, nondistended, no guarding or rebound tenderness, bowel sounds positive x 4 Extremities: Generalized weakness, no deformities, 1+ edema bilateral lower extremities, no cyanosis, good peripheral perfusion Neuro: Lightly sedated, opens eyes to voice  and intermittently following commands, at times reaching for ETT, no focal deficits noted, pupils PERRLA GU: Foley catheter in place draining yellow urine  Resolved Hospital Problem list     Assessment & Plan:   #Acute Hypoxic Respiratory Failure in the setting of ... #RSV Pneumonia #Questionable superimposed bacterial pneumonia #Questionable pulmonary edema PMHx: Pulmonary Embolism, OSA -CTa Chest 4/10 negative for PE, with diffuse bilateral opacities concerning for multifocal pneumonia +/-  pulmonary edema -Full vent support, implement lung protective strategies -Plateau pressures less than 30 cm H20 -Wean FiO2 & PEEP as tolerated to maintain O2 sats >92% -Follow intermittent Chest X-ray & ABG as needed -Spontaneous Breathing Trials when respiratory parameters met and mental status permits -Implement VAP Bundle -Bronchodilators  prn -IV Steroids -ABX as above -Diuresis as BP and renal function permits ~ give 40 mg IV Lasix x1 dose on 4/11 -Pulmonary toilet as able -Currently on Xarelto, may need to transition to heparin drip for [facility]  #Questionable new onset CHF -Continuous cardiac monitoring -Maintain MAP >65 -Vasopressors as needed to maintain MAP goal ~ not requiring -Lactic acid has normalized -BNP is 632 -Echocardiogram pending -Diuresis as BP and renal function permits ~ give 40 mg IV Lasix x1 dose 4/11  #Met SIRS Criteria on presentation (WBC 14.8, Temperature 102.2 F, pulse 119, respiratory rate 33) #Severe Sepsis due to ... #RSV Pneumonia #Suspected superimposed Bacterial Pneumonia (HCAP) #E. Coli UTI -Monitor fever curve -Trend WBC's & Procalcitonin -Follow cultures as above -Continue empiric Azithromycin, Cefepime, Linezolid pending cultures & sensitivities  #Acute Kidney Injury -Monitor I&O's / urinary output -Follow BMP -Ensure adequate renal perfusion -Avoid nephrotoxic agents as able -Replace electrolytes as indicated ~ Pharmacy following for assistance with electrolyte replacement  #Normocytic Normochromic Anemia without s/sx of bleeding -Monitor for S/Sx of bleeding -Trend CBC -Xarelto for AC/VTE Prophylaxis  -Transfuse for Hgb <7  #Diabetes Mellitus Type II -CBG's q4h; Target range of 140 to 180 -SSI -Follow ICU Hypo/Hyperglycemia protocol  #Acute Metabolic Encephalopathy #Sedation needs in setting of mechanical ventilation PMHx: Stroke, Dementia, Depression -Treatment of metabolic derangements and sepsis as outlined above -Maintain a RASS goal of 0 to -1 -Propofol as needed to maintain RASS goal -Avoid sedating medications as able -Daily wake up assessment ~ utilize Precedex for WUA     Pt is critically ill with multiorgan failure. Prognosis is guarded, high risk for further decompensation, cardiac arrest, and death.   Given current critical illness superimposed on  multiple chronic co-morbidities and advanced age, overall long term prognosis is poor.  Recommend consideration of DNR/DNI status.   Best Practice (right click and "Reselect all SmartList Selections" daily)   Diet/type: NPO, start tube feeds DVT prophylaxis: DOAC GI prophylaxis: PPI Lines: N/A Foley:  yes, and is still needed Code Status:  full code Last date of multidisciplinary goals of care discussion [4/11]  4/11: Pt's cousin [name] at bedside on plan of care.    Labs   CBC: Recent Labs  Lab 10/06/23 1939 10/07/23 0510 10/08/23 0346  WBC 17.4* 14.8* 20.4*  NEUTROABS 15.5*  --   --   HGB 11.1* 10.7* 11.5*  HCT 34.7* 32.6* 36.8  MCV 101.5* 98.2 104.5*  PLT 227 214 199    Basic Metabolic Panel: Recent Labs  Lab 10/06/23 1939 10/07/23 0510 10/08/23 0346  NA 137 138 137  K 3.0* 3.5 3.8  CL 106 106 105  CO2 22 22 20*  GLUCOSE 187* 107* 160*  BUN 23 20 21   CREATININE 1.14* 0.95 1.23*  CALCIUM 8.7* 8.6* 8.5*  MG  --   --  1.7  PHOS  --   --  4.8*   GFR: Estimated Creatinine Clearance: 50.5 mL/min (A) (by C-G formula based on SCr of 1.23 mg/dL (H)). Recent Labs  Lab 10/06/23 1939 10/06/23 2202 10/07/23 0510 10/08/23 0346  PROCALCITON 0.33  --   --   --   WBC 17.4*  --  14.8* 20.4*  LATICACIDVEN 2.4* 1.1  --   --     Liver Function Tests: Recent Labs  Lab 10/06/23 1939 10/08/23 0346  AST 34  --   ALT 21  --   ALKPHOS 63  --   BILITOT 1.1  --   PROT 6.7  --   ALBUMIN 3.4* 2.9*   No results for input(s): "LIPASE", "AMYLASE" in the last 168 hours. No results for input(s): "AMMONIA" in the last 168 hours.  ABG    Component Value Date/Time   PHART 7.36 10/07/2023 1312   PCO2ART 41 10/07/2023 1312   PO2ART 99 10/07/2023 1312   HCO3 23.2 10/07/2023 1312   ACIDBASEDEF 2.2 (H) 10/07/2023 1312   O2SAT 99.1 10/07/2023 1312     Coagulation Profile: Recent Labs  Lab 10/06/23 1939  INR 1.2    Cardiac Enzymes: No results for input(s): "CKTOTAL",  "CKMB", "CKMBINDEX", "TROPONINI" in the last 168 hours.  HbA1C: Hgb A1c MFr Bld  Date/Time Value Ref Range Status  08/03/2023 06:44 AM 5.4 4.8 - 5.6 % Final    Comment:    (NOTE) Pre diabetes:          5.7%-6.4%  Diabetes:              >6.4%  Glycemic control for   <7.0% adults with diabetes     CBG: Recent Labs  Lab 10/07/23 1505 10/07/23 1954 10/07/23 2325 10/08/23 0322 10/08/23 0726  GLUCAP 126* 121* 140* 140* 154*    Review of Systems:   Unable to assess due to intubation/sedation/critical illness   Past Medical History:  She,  has a past medical history of Arthritis, CAD in native artery, CKD (chronic kidney disease), stage III (HCC), Dementia (HCC), Depression, Hypertension, Hypothyroidism, Pulmonary embolism (HCC) (2014), Renal cell carcinoma of right kidney (HCC) (2009), Sleep apnea, Solitary kidney, acquired (2009), Stroke [facility]), and Type 2 diabetes mellitus (HCC).   Surgical History:   Past Surgical History:  Procedure Laterality Date   CATARACT EXTRACTION W/PHACO Left 01/13/2023   Procedure: CATARACT EXTRACTION PHACO AND INTRAOCULAR LENS PLACEMENT (IOC) LEFT VISION BLUE HEALON 5 13.28 01:06.8;  Surgeon: Lockie Mola, MD;  [facility];  Service: Ophthalmology;  Laterality: Left;   NEPHRECTOMY Right 2009   Rt kidney removal Right      Social History:   reports that she has never smoked. She has never used smokeless tobacco. She reports that she does not drink alcohol and does not use drugs.   Family History:  Her family history is not on file.   Allergies Allergies  Allergen Reactions   Aspirin     On warfarin   Bee Venom    Lisinopril Cough   Metformin Other (See Comments)    Nausea with significant weight loss   Spironolactone     Hyperkalemia   Wasp Venom Protein      Home Medications  Prior to Admission medications   Medication Sig Start Date End Date Taking? Authorizing Provider  acetaminophen (TYLENOL) 650 MG  CR tablet Take 1,300 mg by mouth in the  morning and at bedtime.   Yes [provider]  amLODipine (NORVASC) 10 MG tablet Take 10 mg by mouth daily.   Yes [provider]  Carboxymethylcellul-Glycerin (REFRESH OPTIVE OP) Place 1 drop into both eyes in the morning and at bedtime.   Yes [provider]  cloNIDine (CATAPRES-TTS-2) 0.2 mg/24hr patch Place 0.2 mg onto the skin once a week. Wednesdays   Yes [provider]  DULoxetine (CYMBALTA) 30 MG capsule Take 30 mg by mouth daily.   Yes [provider]  furosemide (LASIX) 40 MG tablet Take 40 mg by mouth daily. 08/30/23  Yes [provider]  gabapentin (NEURONTIN) 100 MG capsule Take 100 mg by mouth 2 (two) times daily. 09/24/23  Yes [provider]  levothyroxine (SYNTHROID, LEVOTHROID) 100 MCG tablet TAKE 1 TABLET BY MOUTH EVERY MORNING 09/23/16  Yes [provider]  lidocaine (XYLOCAINE) 4 % external solution Apply topically as needed (both knees).   Yes [provider]  losartan (COZAAR) 100 MG tablet Take 100 mg by mouth daily.   Yes [provider]  meloxicam (MOBIC) 7.5 MG tablet Take 7.5 mg by mouth daily.   Yes [provider]  OLANZapine (ZYPREXA) 10 MG tablet Take 10 mg by mouth daily.   Yes [provider]  rivaroxaban (XARELTO) 10 MG TABS tablet Take 10 mg by mouth daily.   Yes [provider]  rosuvastatin (CRESTOR) 5 MG tablet Take 5 mg by mouth at bedtime.   Yes [provider]  trolamine salicylate (ASPERCREME) 10 % cream Apply 1 Application topically 3 (three) times daily as needed for muscle pain (knee pain).   Yes [provider]  vitamin D3 (CHOLECALCIFEROL) 25 MCG tablet Take 1,000 Units by mouth daily.   Yes [provider]  EPINEPHrine 0.3 mg/0.3 mL IJ SOAJ injection Inject 0.3 mg into the muscle. Patient not taking: Reported on 01/06/2023 11/24/16   [provider]     Critical  care time: 40 minutes     Harlon Ditty, AGACNP-BC  Pulmonary & Critical Care Prefer epic messenger for cross cover needs If after hours, please call E-link

## 2023-10-09 ENCOUNTER — Inpatient Hospital Stay: Payer: Medicare (Managed Care)

## 2023-10-09 DIAGNOSIS — J121 Respiratory syncytial virus pneumonia: Secondary | ICD-10-CM | POA: Diagnosis not present

## 2023-10-09 DIAGNOSIS — J9601 Acute respiratory failure with hypoxia: Secondary | ICD-10-CM | POA: Diagnosis not present

## 2023-10-09 DIAGNOSIS — A419 Sepsis, unspecified organism: Secondary | ICD-10-CM | POA: Diagnosis not present

## 2023-10-09 DIAGNOSIS — N179 Acute kidney failure, unspecified: Secondary | ICD-10-CM | POA: Diagnosis not present

## 2023-10-09 LAB — GLUCOSE, CAPILLARY
Glucose-Capillary: 117 mg/dL — ABNORMAL HIGH (ref 70–99)
Glucose-Capillary: 125 mg/dL — ABNORMAL HIGH (ref 70–99)
Glucose-Capillary: 126 mg/dL — ABNORMAL HIGH (ref 70–99)
Glucose-Capillary: 131 mg/dL — ABNORMAL HIGH (ref 70–99)
Glucose-Capillary: 139 mg/dL — ABNORMAL HIGH (ref 70–99)
Glucose-Capillary: 98 mg/dL (ref 70–99)

## 2023-10-09 LAB — CBC
HCT: 32.2 % — ABNORMAL LOW (ref 36.0–46.0)
Hemoglobin: 11.2 g/dL — ABNORMAL LOW (ref 12.0–15.0)
MCH: 32.9 pg (ref 26.0–34.0)
MCHC: 34.8 g/dL (ref 30.0–36.0)
MCV: 94.7 fL (ref 80.0–100.0)
Platelets: 231 10*3/uL (ref 150–400)
RBC: 3.4 MIL/uL — ABNORMAL LOW (ref 3.87–5.11)
RDW: 12.2 % (ref 11.5–15.5)
WBC: 11.6 10*3/uL — ABNORMAL HIGH (ref 4.0–10.5)
nRBC: 0 % (ref 0.0–0.2)

## 2023-10-09 LAB — RENAL FUNCTION PANEL
Albumin: 2.6 g/dL — ABNORMAL LOW (ref 3.5–5.0)
Anion gap: 10 (ref 5–15)
BUN: 37 mg/dL — ABNORMAL HIGH (ref 8–23)
CO2: 21 mmol/L — ABNORMAL LOW (ref 22–32)
Calcium: 9 mg/dL (ref 8.9–10.3)
Chloride: 110 mmol/L (ref 98–111)
Creatinine, Ser: 1.23 mg/dL — ABNORMAL HIGH (ref 0.44–1.00)
GFR, Estimated: 47 mL/min — ABNORMAL LOW (ref >60)
Glucose, Bld: 155 mg/dL — ABNORMAL HIGH (ref 70–99)
Phosphorus: 3.9 mg/dL (ref 2.5–4.6)
Potassium: 3.5 mmol/L (ref 3.5–5.1)
Sodium: 141 mmol/L (ref 135–145)

## 2023-10-09 LAB — PHOSPHORUS: Phosphorus: 3.9 mg/dL (ref 2.5–4.6)

## 2023-10-09 LAB — MAGNESIUM: Magnesium: 1.9 mg/dL (ref 1.7–2.4)

## 2023-10-09 MED ORDER — FUROSEMIDE 10 MG/ML IJ SOLN
40.0000 mg | Freq: Once | INTRAMUSCULAR | Status: AC
  Administered 2023-10-09: 40 mg via INTRAVENOUS
  Filled 2023-10-09: qty 4

## 2023-10-09 NOTE — Plan of Care (Signed)
  Problem: Fluid Volume: Goal: Hemodynamic stability will improve Outcome: Progressing   Problem: Fluid Volume: Goal: Hemodynamic stability will improve Outcome: Progressing   Problem: Clinical Measurements: Goal: Diagnostic test results will improve Outcome: Progressing   Problem: Clinical Measurements: Goal: Diagnostic test results will improve Outcome: Progressing   Problem: Respiratory: Goal: Ability to maintain a clear airway and adequate ventilation will improve Outcome: Progressing   Problem: Respiratory: Goal: Ability to maintain a clear airway and adequate ventilation will improve Outcome: Progressing   Problem: Clinical Measurements: Goal: Will remain free from infection Outcome: Progressing Goal: Diagnostic test results will improve Outcome: Progressing Goal: Respiratory complications will improve Outcome: Progressing   Problem: Clinical Measurements: Goal: Will remain free from infection Outcome: Progressing   Problem: Clinical Measurements: Goal: Diagnostic test results will improve Outcome: Progressing   Problem: Clinical Measurements: Goal: Respiratory complications will improve Outcome: Progressing

## 2023-10-09 NOTE — Procedures (Signed)
 Extubation Procedure Note  Patient Details:   Name: Kylie [name] DOB: Mar 19, 1956 MRN: [id number]   Airway Documentation:    Vent end date: 10/09/23 Vent end time: 0918   Evaluation  O2 sats: stable throughout Complications: No apparent complications Patient did tolerate procedure well. Bilateral Breath Sounds: Clear   Yes able to cough and speak  Emmette Harms 10/09/2023, 9:20 AM

## 2023-10-09 NOTE — Progress Notes (Signed)
 NAME:  Kylie [name], MRN:  [id number], DOB:  [birth year], LOS: 3 ADMISSION DATE:  10/06/2023, CONSULTATION DATE:  10/07/2023 REFERRING MD:  Dr. Meyer Ada, CHIEF COMPLAINT:  Acute Hypoxic Respiratory Failure   Brief Pt Description / Synopsis:  68 y.o. female with PMHx significant for PE on Xarelto, Dementia, CVA with aphasia, renal cell carcinoma s/p right Nephrectomy (12/2007), and CKD Stage III, who is  admitted with Acute Metabolic Encephalopathy, Acute Hypoxic Respiratory Failure, and Severe Sepsis due to RSV Pneumonia and suspected superimposed bacterial HCAP, questionable new onset CHF, along with E. Coli UTI.  Requiring intubation and mechanical ventilation.   History of Present Illness:  Kylie [name] is a 68 y.o. female with medical history significant for prior renal cell carcinoma s/p R nephrectomy (12/2007), HTN, and dementia, prior CVA with aphasia, diabetes and history of PE on Xarelto who presents to [facility] ED on 10/16/2023 from her skilled nursing facility due to fever, shortness of breath, and increased confusion from baseline.  Patient is currently altered and unable to contribute to history and no family is available, therefore history is obtained from H&P from Hospitalist.  Is was reported she was sent over due to the above symptoms.  Upon EMS arrival she was noted to be hypoxic with O2 saturations of 70% on room air which she was placed on CPAP for transport.  Upon arrival to the ED was transitioned over to BiPAP.  Of note she was recently hospitalized at [facility] from 08/02/2023 through 08/05/2023 due to fall with traumatic rhabdomyolysis.  ED Course: Initial Vital Signs: Temperature 102.2 F, pulse 119, respiratory rate 33, blood pressure 154/87, SpO2 100% on BiPAP Significant Labs: WBC 17, lactic acid 2.1, potassium 3, creatinine 1.14 (near baseline), hemoglobin 11 (near baseline) RSV positive UA showing moderate leuks and rare bacteria Imaging Chest X-ray>>IMPRESSION: 1. Cardiomegaly  with mild central congestion. 2. Heterogeneous asymmetrical airspace disease in the left thorax, suspect for pneumonia. CT angio Chest>>IMPRESSION: 1. No embolism to the proximal subsegmental pulmonary artery level. 2. Diffuse heterogeneous opacities throughout bilateral lungs with areas of smooth interlobular and intra lobular septal thickening. No pleural effusion. Findings favor multilobar pneumonia on the background of pulmonary edema. Correlate clinically. Follow-up to clearing is recommended. The 3. Multiple other nonacute observations, as described above. Aortic Atherosclerosis (ICD10-I70.0). Medications Administered: Normal saline bolus, IV ceftriaxone and azithromycin  Due to poor tolerance of BiPAP, she was transitioned to heated high flow nasal cannula at 40 L/min with 50% FiO2.  [facility] asked to admit for further workup and treatment.  Please see "Significant Hospital Events" section below for full detailed hospital course.   Pertinent  Medical History   Past Medical History:  Diagnosis Date   Arthritis    CAD in native artery    CKD (chronic kidney disease), stage III (HCC)    Dementia (HCC)    Depression    Hypertension    Hypothyroidism    Pulmonary embolism (HCC) 2014   Renal cell carcinoma of right kidney (HCC) 2009   Sleep apnea    Solitary kidney, acquired 2009   right kidney removed   Stroke [facility])    Type 2 diabetes mellitus (HCC)    no current medications    Micro Data:  4/9: COVID/RSV/influenza PCR>> + RSV 4/9: Blood culture x 2>> no growth to date 4/9: Urine>> E. Coli 4/10: MRSA PCR>> negative 4/10: Tracheal aspirate>> 4/10: Strep pneumo urine antigen>> negative 4/10: Legionella urine antigen>>  Antimicrobials:   Anti-infectives (From admission, onward)  Start     Dose/Rate Route Frequency Ordered Stop   10/09/23 0000  ceFEPIme (MAXIPIME) 2 g in sodium chloride 0.9 % 100 mL IVPB        2 g 200 mL/hr over 30 Minutes Intravenous Every 12  hours 10/08/23 1559     10/07/23 2200  azithromycin (ZITHROMAX) 500 mg in sodium chloride 0.9 % 250 mL IVPB        500 mg 250 mL/hr over 60 Minutes Intravenous Every 24 hours 10/07/23 0950 10/11/23 2159   10/07/23 2000  cefTRIAXone (ROCEPHIN) 2 g in sodium chloride 0.9 % 100 mL IVPB  Status:  Discontinued        2 g 200 mL/hr over 30 Minutes Intravenous Every 24 hours 10/06/23 2250 10/07/23 0933   10/07/23 2000  azithromycin (ZITHROMAX) 500 mg in sodium chloride 0.9 % 250 mL IVPB  Status:  Discontinued        500 mg 250 mL/hr over 60 Minutes Intravenous Every 24 hours 10/06/23 2250 10/07/23 0933   10/07/23 1200  ceFEPIme (MAXIPIME) 2 g in sodium chloride 0.9 % 100 mL IVPB  Status:  Discontinued        2 g 200 mL/hr over 30 Minutes Intravenous Every 8 hours 10/07/23 0933 10/08/23 1559   10/07/23 1000  linezolid (ZYVOX) IVPB 600 mg        600 mg 300 mL/hr over 60 Minutes Intravenous Every 12 hours 10/07/23 0933     10/06/23 1945  cefTRIAXone (ROCEPHIN) 2 g in sodium chloride 0.9 % 100 mL IVPB        2 g 200 mL/hr over 30 Minutes Intravenous Once 10/06/23 1935 10/06/23 2027   10/06/23 1945  azithromycin (ZITHROMAX) 500 mg in sodium chloride 0.9 % 250 mL IVPB        500 mg 250 mL/hr over 60 Minutes Intravenous  Once 10/06/23 1935 10/06/23 2126       Significant Hospital Events: Including procedures, antibiotic start and stop dates in addition to other pertinent events   4/9: Admitted by [facility] for acute hypoxic respiratory failure and severe sepsis due to RSV pneumonia and suspected superimposed bacterial pneumonia. 4/10: Increasing FiO2 requirements and increased work of breathing.  PCCM consulted.  Ultimately required intubation and mechanical ventilation.  Critically ill. 4/11: No significant events noted overnight.  Vent support weaned to 40% FiO2 and 5 PEEP.  Not requiring vasopressors.  Urine culture resulted with E. Coli.  Diurese with Lasix 40 mg x1 dose.  Echocardiogram pending. 4/12  remains on vent   Interim History / Subjective:  Remains critically ill Remains intubated Requires VENT support for survival Vent Mode: PRVC FiO2 (%):  [28 %-30 %] 28 % Set Rate:  [18 bmp] 18 bmp Vt Set:  [500 mL] 500 mL PEEP:  [5 cmH20] 5 cmH20 Plateau Pressure:  [16 cmH20-18 cmH20] 18 cmH20    Objective   Blood pressure (!) 164/90, pulse (!) 46, temperature 98.2 F (36.8 C), temperature source Oral, resp. rate 18, height 5\' 10"  (1.778 m), weight 85.3 kg, SpO2 100%.    Vent Mode: PRVC FiO2 (%):  [28 %-30 %] 28 % Set Rate:  [18 bmp] 18 bmp Vt Set:  [500 mL] 500 mL PEEP:  [5 cmH20] 5 cmH20 Plateau Pressure:  [16 cmH20-18 cmH20] 18 cmH20   Intake/Output Summary (Last 24 hours) at 10/09/2023 0756 Last data filed at 10/09/2023 0600 Gross per 24 hour  Intake 1471.62 ml  Output 1905 ml  Net -433.38 ml  Filed Weights   10/06/23 2028 10/07/23 1505  Weight: 86 kg 85.3 kg     REVIEW OF SYSTEMS  PATIENT IS UNABLE TO PROVIDE COMPLETE REVIEW OF SYSTEMS DUE TO SEVERE CRITICAL ILLNESS   PHYSICAL EXAMINATION:  GENERAL:critically ill appearing, +resp distress EYES: Pupils equal, round, reactive to light.  No scleral icterus.  MOUTH: Moist mucosal membrane. INTUBATED NECK: Supple.  PULMONARY: Lungs clear to auscultation, +rhonchi, +wheezing CARDIOVASCULAR: S1 and S2.  Regular rate and rhythm GASTROINTESTINAL: Soft, nontender, -distended. Positive bowel sounds.  MUSCULOSKELETAL: No swelling, clubbing, or edema.  NEUROLOGIC: obtunded,sedated SKIN:normal, warm to touch, Capillary refill delayed  Pulses present bilaterally    Assessment & Plan:   68 yo NH patient with severe hypoxic resp failure due to severe RSV and HCAP pneumonia leading to intubation and MV support with sepsis  Severe ACUTE Hypoxic and Hypercapnic Respiratory Failure -continue Mechanical Ventilator support -Wean Fio2 and PEEP as tolerated -VAP/VENT bundle implementation - Wean PEEP & FiO2 as tolerated,  maintain SpO2 > 88% - Head of bed elevated 30 degrees, VAP protocol in place - Plateau pressures less than 30 cm H20  - Intermittent chest x-ray & ABG PRN - Ensure adequate pulmonary hygiene  -will perform SAT/SBT when respiratory parameters are met  INFECTIOUS DISEASE RSV Pneumonia/HCAP/E. Coli UTI -continue antibiotics as prescribed -follow up cultures -Continue empiric Azithromycin, Cefepime, Linezolid pending cultures & sensitivities  ACUTE SYSTOLIC CARDIAC FAILURE- EF 40% -oxygen as needed -Lasix as tolerated -follow up cardiac enzymes as indicated  ACUTE KIDNEY INJURY/Renal Failure -continue Foley Catheter-assess need -Avoid nephrotoxic agents -Follow urine output, BMP -Ensure adequate renal perfusion, optimize oxygenation -Renal dose medications   Intake/Output Summary (Last 24 hours) at 10/09/2023 0802 Last data filed at 10/09/2023 0600 Gross per 24 hour  Intake 1471.62 ml  Output 1905 ml  Net -433.38 ml      Latest Ref Rng & Units 10/09/2023    3:16 AM 10/08/2023    3:46 AM 10/07/2023    5:10 AM  BMP  Glucose 70 - 99 mg/dL 161  096  045   BUN 8 - 23 mg/dL 37  21  20   Creatinine 0.44 - 1.00 mg/dL 4.09  8.11  9.14   Sodium 135 - 145 mmol/L 141  137  138   Potassium 3.5 - 5.1 mmol/L 3.5  3.8  3.5   Chloride 98 - 111 mmol/L 110  105  106   CO2 22 - 32 mmol/L 21  20  22    Calcium 8.9 - 10.3 mg/dL 9.0  8.5  8.6     NEUROLOGY ACUTE METABOLIC ENCEPHALOPATHY -need for sedation PMHx: Stroke, Dementia, Depression -Treatment of metabolic derangements and sepsis as outlined above -Daily wake up assessment ~ consider utilizing Precedex for WUA    ENDO - ICU hypoglycemic\Hyperglycemia protocol -check FSBS per protocol   GI GI PROPHYLAXIS as indicated NUTRITIONAL STATUS DIET-->TF's as tolerated Constipation protocol as indicated   ELECTROLYTES -follow labs as needed -replace as needed -pharmacy consultation and following  RESTRICTIVE TRANSFUSION  PROTOCOL TRANSFUSION  IF HGB<7  or ACTIVE BLEEDING OR DX of ACUTE CORONARY SYNDROMES      Pt is critically ill with multiorgan failure. Prognosis is guarded, high risk for further decompensation, cardiac arrest, and death.   Given current critical illness superimposed on multiple chronic co-morbidities and advanced age, overall long term prognosis is poor.  Recommend consideration of DNR/DNI status.   Best Practice (right click and "Reselect all SmartList Selections" daily)   Diet/type:  NPO, start tube feeds DVT prophylaxis: DOAC GI prophylaxis: PPI Lines: N/A Foley:  yes, and is still needed Code Status:  full code Last date of multidisciplinary goals of care discussion [4/11]  4/11: Pt's cousin [name] at bedside on plan of care.    Labs   CBC: Recent Labs  Lab 10/06/23 1939 10/07/23 0510 10/08/23 0346 10/09/23 0316  WBC 17.4* 14.8* 20.4* 11.6*  NEUTROABS 15.5*  --   --   --   HGB 11.1* 10.7* 11.5* 11.2*  HCT 34.7* 32.6* 36.8 32.2*  MCV 101.5* 98.2 104.5* 94.7  PLT 227 214 199 231    Basic Metabolic Panel: Recent Labs  Lab 10/06/23 1939 10/07/23 0510 10/08/23 0346 10/09/23 0316  NA 137 138 137 141  K 3.0* 3.5 3.8 3.5  CL 106 106 105 110  CO2 22 22 20* 21*  GLUCOSE 187* 107* 160* 155*  BUN 23 20 21  37*  CREATININE 1.14* 0.95 1.23* 1.23*  CALCIUM 8.7* 8.6* 8.5* 9.0  MG  --   --  1.7 1.9  PHOS  --   --  4.8* 3.9  3.9   GFR: Estimated Creatinine Clearance: 50.5 mL/min (A) (by C-G formula based on SCr of 1.23 mg/dL (H)). Recent Labs  Lab 10/06/23 1939 10/06/23 2202 10/07/23 0510 10/08/23 0346 10/09/23 0316  PROCALCITON 0.33  --   --   --   --   WBC 17.4*  --  14.8* 20.4* 11.6*  LATICACIDVEN 2.4* 1.1  --   --   --     Liver Function Tests: Recent Labs  Lab 10/06/23 1939 10/08/23 0346 10/09/23 0316  AST 34  --   --   ALT 21  --   --   ALKPHOS 63  --   --   BILITOT 1.1  --   --   PROT 6.7  --   --   ALBUMIN 3.4* 2.9* 2.6*   No results for  input(s): "LIPASE", "AMYLASE" in the last 168 hours. No results for input(s): "AMMONIA" in the last 168 hours.  ABG    Component Value Date/Time   PHART 7.36 10/07/2023 1312   PCO2ART 41 10/07/2023 1312   PO2ART 99 10/07/2023 1312   HCO3 23.2 10/07/2023 1312   ACIDBASEDEF 2.2 (H) 10/07/2023 1312   O2SAT 99.1 10/07/2023 1312     Coagulation Profile: Recent Labs  Lab 10/06/23 1939  INR 1.2    Cardiac Enzymes: No results for input(s): "CKTOTAL", "CKMB", "CKMBINDEX", "TROPONINI" in the last 168 hours.  HbA1C: Hgb A1c MFr Bld  Date/Time Value Ref Range Status  08/03/2023 06:44 AM 5.4 4.8 - 5.6 % Final    Comment:    (NOTE) Pre diabetes:          5.7%-6.4%  Diabetes:              >6.4%  Glycemic control for   <7.0% adults with diabetes     CBG: Recent Labs  Lab 10/08/23 1544 10/08/23 1947 10/08/23 2338 10/09/23 0348 10/09/23 0741  GLUCAP 215* 206* 242* 139* 125*    Review of Systems:   Unable to assess due to intubation/sedation/critical illness   Past Medical History:  She,  has a past medical history of Arthritis, CAD in native artery, CKD (chronic kidney disease), stage III (HCC), Dementia (HCC), Depression, Hypertension, Hypothyroidism, Pulmonary embolism (HCC) (2014), Renal cell carcinoma of right kidney (HCC) (2009), Sleep apnea, Solitary kidney, acquired (2009), Stroke [facility]), and Type 2 diabetes mellitus (HCC).   Surgical  History:   Past Surgical History:  Procedure Laterality Date   CATARACT EXTRACTION W/PHACO Left 01/13/2023   Procedure: CATARACT EXTRACTION PHACO AND INTRAOCULAR LENS PLACEMENT (IOC) LEFT VISION BLUE HEALON 5 13.28 01:06.8;  Surgeon: Annell Kidney, MD;  Location: [facility];  Service: Ophthalmology;  Laterality: Left;   NEPHRECTOMY Right 2009   Rt kidney removal Right      Social History:   reports that she has never smoked. She has never used smokeless tobacco. She reports that she does not drink alcohol and does  not use drugs.   Family History:  Her family history is not on file.   Allergies Allergies  Allergen Reactions   Aspirin     On warfarin   Bee Venom    Lisinopril Cough   Metformin Other (See Comments)    Nausea with significant weight loss   Spironolactone     Hyperkalemia   Wasp Venom Protein      Home Medications  Prior to Admission medications   Medication Sig Start Date End Date Taking? Authorizing Provider  acetaminophen (TYLENOL) 650 MG CR tablet Take 1,300 mg by mouth in the morning and at bedtime.   Yes [provider]  amLODipine (NORVASC) 10 MG tablet Take 10 mg by mouth daily.   Yes [provider]  Carboxymethylcellul-Glycerin (REFRESH OPTIVE OP) Place 1 drop into both eyes in the morning and at bedtime.   Yes [provider]  cloNIDine (CATAPRES-TTS-2) 0.2 mg/24hr patch Place 0.2 mg onto the skin once a week. Wednesdays   Yes [provider]  DULoxetine (CYMBALTA) 30 MG capsule Take 30 mg by mouth daily.   Yes [provider]  furosemide (LASIX) 40 MG tablet Take 40 mg by mouth daily. 08/30/23  Yes [provider]  gabapentin (NEURONTIN) 100 MG capsule Take 100 mg by mouth 2 (two) times daily. 09/24/23  Yes [provider]  levothyroxine (SYNTHROID, LEVOTHROID) 100 MCG tablet TAKE 1 TABLET BY MOUTH EVERY MORNING 09/23/16  Yes [provider]  lidocaine (XYLOCAINE) 4 % external solution Apply topically as needed (both knees).   Yes [provider]  losartan (COZAAR) 100 MG tablet Take 100 mg by mouth daily.   Yes [provider]  meloxicam (MOBIC) 7.5 MG tablet Take 7.5 mg by mouth daily.   Yes [provider]  OLANZapine (ZYPREXA) 10 MG tablet Take 10 mg by mouth daily.   Yes [provider]  rivaroxaban (XARELTO) 10 MG TABS tablet Take 10 mg by mouth daily.   Yes [provider]  rosuvastatin (CRESTOR) 5 MG tablet Take 5 mg by mouth at bedtime.   Yes  [provider]  trolamine salicylate (ASPERCREME) 10 % cream Apply 1 Application topically 3 (three) times daily as needed for muscle pain (knee pain).   Yes [provider]  vitamin D3 (CHOLECALCIFEROL) 25 MCG tablet Take 1,000 Units by mouth daily.   Yes [provider]  EPINEPHrine 0.3 mg/0.3 mL IJ SOAJ injection Inject 0.3 mg into the muscle. Patient not taking: Reported on 01/06/2023 11/24/16   [provider]       Critical Care Time devoted to patient care services described in this note is 65 minutes.  Critical care was necessary to treat /prevent imminent and life-threatening deterioration. Overall, patient is critically ill, prognosis is guarded.  Patient with Multiorgan failure and at high risk for cardiac arrest and death.    Lady Pier, M.D.  [facility]  Medical Director [facility] Medical Director [facility] Cardio-Pulmonary Department

## 2023-10-10 ENCOUNTER — Inpatient Hospital Stay: Payer: Medicare (Managed Care)

## 2023-10-10 DIAGNOSIS — A419 Sepsis, unspecified organism: Secondary | ICD-10-CM | POA: Diagnosis not present

## 2023-10-10 DIAGNOSIS — J189 Pneumonia, unspecified organism: Secondary | ICD-10-CM | POA: Diagnosis not present

## 2023-10-10 LAB — CBC
HCT: 32.5 % — ABNORMAL LOW (ref 36.0–46.0)
Hemoglobin: 11.2 g/dL — ABNORMAL LOW (ref 12.0–15.0)
MCH: 32.4 pg (ref 26.0–34.0)
MCHC: 34.5 g/dL (ref 30.0–36.0)
MCV: 93.9 fL (ref 80.0–100.0)
Platelets: 282 10*3/uL (ref 150–400)
RBC: 3.46 MIL/uL — ABNORMAL LOW (ref 3.87–5.11)
RDW: 12.5 % (ref 11.5–15.5)
WBC: 14.9 10*3/uL — ABNORMAL HIGH (ref 4.0–10.5)
nRBC: 0 % (ref 0.0–0.2)

## 2023-10-10 LAB — GLUCOSE, CAPILLARY
Glucose-Capillary: 101 mg/dL — ABNORMAL HIGH (ref 70–99)
Glucose-Capillary: 109 mg/dL — ABNORMAL HIGH (ref 70–99)
Glucose-Capillary: 111 mg/dL — ABNORMAL HIGH (ref 70–99)
Glucose-Capillary: 113 mg/dL — ABNORMAL HIGH (ref 70–99)
Glucose-Capillary: 143 mg/dL — ABNORMAL HIGH (ref 70–99)
Glucose-Capillary: 159 mg/dL — ABNORMAL HIGH (ref 70–99)

## 2023-10-10 LAB — RENAL FUNCTION PANEL
Albumin: 2.9 g/dL — ABNORMAL LOW (ref 3.5–5.0)
Anion gap: 13 (ref 5–15)
BUN: 45 mg/dL — ABNORMAL HIGH (ref 8–23)
CO2: 21 mmol/L — ABNORMAL LOW (ref 22–32)
Calcium: 9.2 mg/dL (ref 8.9–10.3)
Chloride: 109 mmol/L (ref 98–111)
Creatinine, Ser: 1.34 mg/dL — ABNORMAL HIGH (ref 0.44–1.00)
GFR, Estimated: 43 mL/min — ABNORMAL LOW (ref >60)
Glucose, Bld: 118 mg/dL — ABNORMAL HIGH (ref 70–99)
Phosphorus: 3.3 mg/dL (ref 2.5–4.6)
Potassium: 3.3 mmol/L — ABNORMAL LOW (ref 3.5–5.1)
Sodium: 143 mmol/L (ref 135–145)

## 2023-10-10 LAB — MAGNESIUM: Magnesium: 2 mg/dL (ref 1.7–2.4)

## 2023-10-10 MED ORDER — AMLODIPINE BESYLATE 10 MG PO TABS
10.0000 mg | ORAL_TABLET | Freq: Every day | ORAL | Status: DC
  Administered 2023-10-10 – 2023-10-11 (×2): 10 mg via ORAL
  Filled 2023-10-10 (×2): qty 1

## 2023-10-10 MED ORDER — POTASSIUM CHLORIDE 10 MEQ/100ML IV SOLN
10.0000 meq | INTRAVENOUS | Status: AC
  Administered 2023-10-10 (×2): 10 meq via INTRAVENOUS
  Filled 2023-10-10 (×2): qty 100

## 2023-10-10 MED ORDER — HYDRALAZINE HCL 20 MG/ML IJ SOLN
10.0000 mg | INTRAMUSCULAR | Status: DC | PRN
  Administered 2023-10-10 (×3): 10 mg via INTRAVENOUS
  Filled 2023-10-10 (×3): qty 1

## 2023-10-10 MED ORDER — FUROSEMIDE 40 MG PO TABS
40.0000 mg | ORAL_TABLET | Freq: Every day | ORAL | Status: DC
  Administered 2023-10-10 – 2023-10-11 (×2): 40 mg via ORAL
  Filled 2023-10-10: qty 2
  Filled 2023-10-10: qty 1

## 2023-10-10 MED ORDER — POTASSIUM CHLORIDE 20 MEQ PO PACK
40.0000 meq | PACK | Freq: Once | ORAL | Status: AC
Start: 2023-10-10 — End: 2023-10-10
  Administered 2023-10-10: 40 meq via ORAL
  Filled 2023-10-10: qty 2

## 2023-10-10 MED ORDER — ROSUVASTATIN CALCIUM 5 MG PO TABS: 5.0000 mg | ORAL_TABLET | Freq: Every day | ORAL | Status: DC

## 2023-10-10 MED ORDER — LABETALOL HCL 5 MG/ML IV SOLN
20.0000 mg | INTRAVENOUS | Status: DC | PRN
  Administered 2023-10-10 – 2023-10-11 (×5): 20 mg via INTRAVENOUS
  Filled 2023-10-10 (×6): qty 4

## 2023-10-10 NOTE — Progress Notes (Signed)
 PROGRESS NOTE    Kylie [name]  ZOX:[id number] DOB: [birth year] DOA: 10/06/2023 PCP: [facility]     Brief Narrative:   68 y.o. female with PMHx significant for PE on Xarelto, Dementia, CVA with aphasia, renal cell carcinoma s/p right Nephrectomy (12/2007), and CKD Stage III, who is admitted with Acute Metabolic Encephalopathy, Acute Hypoxic Respiratory Failure, and Severe Sepsis due to RSV Pneumonia and suspected superimposed bacterial HCAP, questionable new onset CHF, along with E. Coli UTI. Requiring intubation and mechanical ventilation.   4/9: Admitted by [facility] for acute hypoxic respiratory failure and severe sepsis due to RSV pneumonia and suspected superimposed bacterial pneumonia. 4/10: Increasing FiO2 requirements and increased work of breathing.  PCCM consulted.  Ultimately required intubation and mechanical ventilation.  Critically ill. 4/11: No significant events noted overnight.  Vent support weaned to 40% FiO2 and 5 PEEP.  Not requiring vasopressors.  Urine culture resulted with E. Coli.  Diurese with Lasix 40 mg x1 dose.  Echocardiogram pending. 4/12: extubated   Assessment & Plan:   Principal Problem:   Sepsis, severe, due to pneumonia (HCC) Active Problems:   RSV (respiratory syncytial virus pneumonia)   Acute respiratory failure with hypoxia (HCC)   Dementia (HCC)   Hypokalemia   Acquired hypothyroidism   Chronic anticoagulation due to history of pulmonary embolism   Essential hypertension   PE (pulmonary thromboembolism) (HCC)   Type 2 diabetes mellitus without complication, without long-term current use of insulin (HCC)   S/p R nephrectomy for RCC   History of CVA (cerebrovascular accident)  # RSV pneumonia # HCAP # Acute hypoxic hypercapnic respiratory failure Now extubated and breathing comfortably on room air. CTA no PE. Abx started 4/9 - continue cefepime/linezolid one more day for 5 day course - d/c foley, no longer needed  # Dementia No  behavioral disturbance. 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 - home lasix  # Hypothyroid - home levothyroxine  # Hx PE - home xarelto  # Leg pain Chronic - will check PVL, is on xarelto  # Hx CVA - xarelto, statin   DVT prophylaxis: xarelto Code Status: full Family Communication: cousin wiley updated telephonically 4/13  Level of care: Progressive Status is: Inpatient Remains inpatient appropriate because: severity of illness    Consultants:  pccm  Procedures: S/p intubation  Antimicrobials:  Cefepime/linezolid    Subjective: Reports feeling OK, breathing much better  Objective: Vitals:   10/10/23 1000 10/10/23 1100 10/10/23 1138 10/10/23 1200  BP: (!) 178/85 (!) 176/118 (!) 178/82 (!) 150/71  Pulse: 86 (!) 107 86 90  Resp: (!) 24 (!) 23 (!) 22 (!) 27  Temp:    98.5 F (36.9 C)  TempSrc:    Oral  SpO2: 96% 94% 96% 94%  Weight:      Height:        Intake/Output Summary (Last 24 hours) at 10/10/2023 1232 Last data filed at 10/10/2023 1219 Gross per 24 hour  Intake 1016.74 ml  Output 1475 ml  Net -458.26 ml   Filed Weights   10/06/23 2028 10/07/23 1505  Weight: 86 kg 85.3 kg    Examination:  General exam: Appears calm and comfortable  Respiratory system: Clear to auscultation.  Safe for few scattered rhonchi Cardiovascular system: S1 & S2 heard, RRR.  Mod harsh systolic murmur Gastrointestinal system: Abdomen is nondistended, soft and nontender.   Central  nervous system: Alert and oriented to self, moving all 4 Extremities: trace LE edema Skin: No rashes, lesions or ulcers Psychiatry: Judgement and insight appear normal. Mood & affect appropriate.     Data Reviewed: I have personally reviewed following labs and imaging studies  CBC: Recent Labs  Lab 10/06/23 1939 10/07/23 0510 10/08/23 0346 10/09/23 0316 10/10/23 0303  WBC  17.4* 14.8* 20.4* 11.6* 14.9*  NEUTROABS 15.5*  --   --   --   --   HGB 11.1* 10.7* 11.5* 11.2* 11.2*  HCT 34.7* 32.6* 36.8 32.2* 32.5*  MCV 101.5* 98.2 104.5* 94.7 93.9  PLT 227 214 199 231 282   Basic Metabolic Panel: Recent Labs  Lab 10/06/23 1939 10/07/23 0510 10/08/23 0346 10/09/23 0316 10/10/23 0303  NA 137 138 137 141 143  K 3.0* 3.5 3.8 3.5 3.3*  CL 106 106 105 110 109  CO2 22 22 20* 21* 21*  GLUCOSE 187* 107* 160* 155* 118*  BUN 23 20 21  37* 45*  CREATININE 1.14* 0.95 1.23* 1.23* 1.34*  CALCIUM 8.7* 8.6* 8.5* 9.0 9.2  MG  --   --  1.7 1.9 2.0  PHOS  --   --  4.8* 3.9  3.9 3.3   GFR: Estimated Creatinine Clearance: 46.4 mL/min (A) (by C-G formula based on SCr of 1.34 mg/dL (H)). Liver Function Tests: Recent Labs  Lab 10/06/23 1939 10/08/23 0346 10/09/23 0316 10/10/23 0303  AST 34  --   --   --   ALT 21  --   --   --   ALKPHOS 63  --   --   --   BILITOT 1.1  --   --   --   PROT 6.7  --   --   --   ALBUMIN 3.4* 2.9* 2.6* 2.9*   No results for input(s): "LIPASE", "AMYLASE" in the last 168 hours. No results for input(s): "AMMONIA" in the last 168 hours. Coagulation Profile: Recent Labs  Lab 10/06/23 1939  INR 1.2   Cardiac Enzymes: No results for input(s): "CKTOTAL", "CKMB", "CKMBINDEX", "TROPONINI" in the last 168 hours. BNP (last 3 results) No results for input(s): "PROBNP" in the last 8760 hours. HbA1C: No results for input(s): "HGBA1C" in the last 72 hours. CBG: Recent Labs  Lab 10/09/23 1942 10/09/23 2350 10/10/23 0347 10/10/23 0753 10/10/23 1119  GLUCAP 98 117* 111* 109* 113*   Lipid Profile: No results for input(s): "CHOL", "HDL", "LDLCALC", "TRIG", "CHOLHDL", "LDLDIRECT" in the last 72 hours. Thyroid Function Tests: No results for input(s): "TSH", "T4TOTAL", "FREET4", "T3FREE", "THYROIDAB" in the last 72 hours. Anemia Panel: No results for input(s): "VITAMINB12", "FOLATE", "FERRITIN", "TIBC", "IRON", "RETICCTPCT" in the last 72  hours. Urine analysis:    Component Value Date/Time   COLORURINE YELLOW (A) 10/06/2023 2013   APPEARANCEUR CLOUDY (A) 10/06/2023 2013   APPEARANCEUR Clear 04/01/2013 1228   LABSPEC 1.024 10/06/2023 2013   LABSPEC 1.014 04/01/2013 1228   PHURINE 5.0 10/06/2023 2013   GLUCOSEU NEGATIVE 10/06/2023 2013   GLUCOSEU Negative 04/01/2013 1228   HGBUR NEGATIVE 10/06/2023 2013   BILIRUBINUR NEGATIVE 10/06/2023 2013   BILIRUBINUR Negative 04/01/2013 1228   KETONESUR NEGATIVE 10/06/2023 2013   PROTEINUR >=300 (A) 10/06/2023 2013   NITRITE NEGATIVE 10/06/2023 2013   LEUKOCYTESUR MODERATE (A) 10/06/2023 2013   LEUKOCYTESUR Negative 04/01/2013 1228   Sepsis Labs: @LABRCNTIP (procalcitonin:4,lacticidven:4)  ) Recent Results (from the past 240 hours)  Resp panel by RT-PCR (RSV, Flu A&B, Covid) Anterior Nasal Swab  Status: Abnormal   Collection Time: 10/06/23  7:39 PM   Specimen: Anterior Nasal Swab  Result Value Ref Range Status   SARS Coronavirus 2 by RT PCR NEGATIVE NEGATIVE Final    Comment: (NOTE) SARS-CoV-2 target nucleic acids are NOT DETECTED.  The SARS-CoV-2 RNA is generally detectable in upper respiratory specimens during the acute phase of infection. The lowest concentration of SARS-CoV-2 viral copies this assay can detect is 138 copies/mL. A negative result does not preclude SARS-Cov-2 infection and should not be used as the sole basis for treatment or other patient management decisions. A negative result may occur with  improper specimen collection/handling, submission of specimen other than nasopharyngeal swab, presence of viral mutation(s) within the areas targeted by this assay, and inadequate number of viral copies(<138 copies/mL). A negative result must be combined with clinical observations, patient history, and epidemiological information. The expected result is Negative.  Fact Sheet for Patients:  BloggerCourse.com  Fact Sheet for  Healthcare Providers:  SeriousBroker.it  This test is no t yet approved or cleared by the [place]  FDA and  has been authorized for detection and/or diagnosis of SARS-CoV-2 by FDA under an Emergency Use Authorization (EUA). This EUA will remain  in effect (meaning this test can be used) for the duration of the COVID-19 declaration under Section 564(b)(1) of the Act, 21 U.S.C.section 360bbb-3(b)(1), unless the authorization is terminated  or revoked sooner.       Influenza A by PCR NEGATIVE NEGATIVE Final   Influenza B by PCR NEGATIVE NEGATIVE Final    Comment: (NOTE) The Xpert Xpress SARS-CoV-2/FLU/RSV plus assay is intended as an aid in the diagnosis of influenza from Nasopharyngeal swab specimens and should not be used as a sole basis for treatment. Nasal washings and aspirates are unacceptable for Xpert Xpress SARS-CoV-2/FLU/RSV testing.  Fact Sheet for Patients: BloggerCourse.com  Fact Sheet for Healthcare Providers: SeriousBroker.it  This test is not yet approved or cleared by the [place]  FDA and has been authorized for detection and/or diagnosis of SARS-CoV-2 by FDA under an Emergency Use Authorization (EUA). This EUA will remain in effect (meaning this test can be used) for the duration of the COVID-19 declaration under Section 564(b)(1) of the Act, 21 U.S.C. section 360bbb-3(b)(1), unless the authorization is terminated or revoked.     Resp Syncytial Virus by PCR POSITIVE (A) NEGATIVE Final    Comment: (NOTE) Fact Sheet for Patients: BloggerCourse.com  Fact Sheet for Healthcare Providers: SeriousBroker.it  This test is not yet approved or cleared by the [place]  FDA and has been authorized for detection and/or diagnosis of SARS-CoV-2 by FDA under an Emergency Use Authorization (EUA). This EUA will remain in effect (meaning  this test can be used) for the duration of the COVID-19 declaration under Section 564(b)(1) of the Act, 21 U.S.C. section 360bbb-3(b)(1), unless the authorization is terminated or revoked.  Performed at [facility], [address] [place], [place] [address]   Blood Culture (routine x 2)     Status: None (Preliminary result)   Collection Time: 10/06/23  7:39 PM   Specimen: BLOOD  Result Value Ref Range Status   Specimen Description BLOOD BLOOD RIGHT ARM  Final   Special Requests   Final    BOTTLES DRAWN AEROBIC AND ANAEROBIC Blood Culture results may not be optimal due to an inadequate volume of blood received in culture bottles   Culture   Final    NO GROWTH 4 DAYS Performed at [facility], [address] [place], [place] [address]    Report Status PENDING  Incomplete  Blood Culture (routine x 2)     Status: None (Preliminary result)   Collection Time: 10/06/23  7:40 PM   Specimen: BLOOD  Result Value Ref Range Status   Specimen Description BLOOD BLOOD LEFT ARM  Final   Special Requests   Final    BOTTLES DRAWN AEROBIC AND ANAEROBIC Blood Culture results may not be optimal due to an inadequate volume of blood received in culture bottles   Culture   Final    NO GROWTH 4 DAYS Performed at [facility], [address] [place], [place] [address]    Report Status PENDING  Incomplete  Urine Culture     Status: Abnormal   Collection Time: 10/06/23  8:13 PM   Specimen: Urine, Random  Result Value Ref Range Status   Specimen Description   Final    URINE, RANDOM Performed at [facility], [address] [place], [place] [address]    Special Requests   Final    NONE Reflexed from [id number] Performed at [facility], [address] [place], [place] [address]    Culture >=100,000 COLONIES/mL ESCHERICHIA COLI (A)  Final   Report Status 10/08/2023 FINAL  Final   Organism ID, Bacteria ESCHERICHIA COLI (A)  Final      Susceptibility    Escherichia coli - MIC*    AMPICILLIN <=2 SENSITIVE Sensitive     CEFAZOLIN <=4 SENSITIVE Sensitive     CEFEPIME <=0.12 SENSITIVE Sensitive     CEFTRIAXONE <=0.25 SENSITIVE Sensitive     CIPROFLOXACIN <=0.25 SENSITIVE Sensitive     GENTAMICIN <=1 SENSITIVE Sensitive     IMIPENEM <=0.25 SENSITIVE Sensitive     NITROFURANTOIN <=16 SENSITIVE Sensitive     TRIMETH/SULFA <=20 SENSITIVE Sensitive     AMPICILLIN/SULBACTAM <=2 SENSITIVE Sensitive     PIP/TAZO <=4 SENSITIVE Sensitive ug/mL    * >=100,000 COLONIES/mL ESCHERICHIA COLI  MRSA Next Gen by PCR, Nasal     Status: None   Collection Time: 10/07/23 10:09 AM   Specimen: Nasal Mucosa; Nasal Swab  Result Value Ref Range Status   MRSA by PCR Next Gen NOT DETECTED NOT DETECTED Final    Comment: (NOTE) The GeneXpert MRSA Assay (FDA approved for NASAL specimens only), is one component of a comprehensive MRSA colonization surveillance program. It is not intended to diagnose MRSA infection nor to guide or monitor treatment for MRSA infections. Test performance is not FDA approved in patients less than 64 years old. Performed at [facility], [address] [place], [place] [address]          Radiology Studies: DG Chest [place] 1 View Result Date: 10/09/2023 CLINICAL DATA:  [id number] Respiratory failure [facility]) [phone] EXAM: PORTABLE CHEST - 1 VIEW COMPARISON:  10/08/2023 FINDINGS: Endotracheal tube and nasogastric tube remain in place. Persistent left infrahilar atelectasis/consolidation. Minimal opacities at the right lung base. Heart size and mediastinal contours are within normal limits. Aortic Atherosclerosis (ICD10-170.0). Mild blunting of the right lateral costophrenic angle. Visualized bones unremarkable. IMPRESSION: Persistent left infrahilar atelectasis/consolidation. Electronically Signed   By: Nicoletta Barrier M.D.   On: 10/09/2023 08:43   ECHOCARDIOGRAM COMPLETE Result Date: 10/08/2023    ECHOCARDIOGRAM REPORT   Patient Name:    Kylie [name] Date of Exam: 10/08/2023 Medical Rec #:  [id number]       Height:       70.0 in Accession #:    [id number]  Weight:       188.1 lb Date of Birth:  02-Jun-1956       BSA:          2.033 m Patient Age:    68 years        BP:           140/71 mmHg Patient Gender: F               HR:           61 bpm. Exam Location:  [facility] Procedure: 2D Echo, Cardiac Doppler and Color Doppler (Both Spectral and Color            Flow Doppler were utilized during procedure). Indications:     Acute respiratory distress  History:         Patient has no prior history of Echocardiogram examinations.                  Stroke; Risk Factors:Hypertension, Diabetes, Dyslipidemia and                  Sleep Apnea. RSV+, Pulmonary embolus.  Sonographer:     Clarke Crouch Referring Phys:  [id number] Delanna Fears Diagnosing Phys: Belva Boyden MD  Sonographer Comments: Technically difficult study due to poor echo windows and echo performed with patient supine and on artificial respirator. IMPRESSIONS  1. Left ventricular ejection fraction, by estimation, is 40 to 45%. The left ventricle has mildly decreased function. The left ventricle demonstrates global hypokinesis. There is moderate left ventricular hypertrophy. Left ventricular diastolic parameters are consistent with Grade II diastolic dysfunction (pseudonormalization).  2. Right ventricular systolic function is normal. The right ventricular size is normal. There is moderately elevated pulmonary artery systolic pressure. The estimated right ventricular systolic pressure is 46.1 mmHg.  3. Left atrial size was mildly dilated.  4. The mitral valve is normal in structure. Moderate mitral valve regurgitation. No evidence of mitral stenosis. Severe mitral annular calcification.  5. Tricuspid valve regurgitation is mild to moderate.  6. The aortic valve is normal in structure. Aortic valve regurgitation is not visualized. No aortic stenosis is present. Aortic valve mean gradient  measures 5.0 mmHg.  7. The inferior vena cava is dilated in size with <50% respiratory variability, suggesting right atrial pressure of 15 mmHg. FINDINGS  Left Ventricle: Left ventricular ejection fraction, by estimation, is 40 to 45%. The left ventricle has mildly decreased function. The left ventricle demonstrates global hypokinesis. Strain was performed and the global longitudinal strain is indeterminate. The left ventricular internal cavity size was normal in size. There is moderate left ventricular hypertrophy. Left ventricular diastolic parameters are consistent with Grade II diastolic dysfunction (pseudonormalization). Right Ventricle: The right ventricular size is normal. No increase in right ventricular wall thickness. Right ventricular systolic function is normal. There is moderately elevated pulmonary artery systolic pressure. The tricuspid regurgitant velocity is 2.79 m/s, and with an assumed right atrial pressure of 15 mmHg, the estimated right ventricular systolic pressure is 46.1 mmHg. Left Atrium: Left atrial size was mildly dilated. Right Atrium: Right atrial size was normal in size. Pericardium: There is no evidence of pericardial effusion. Mitral Valve: The mitral valve is normal in structure. There is moderate calcification of the mitral valve leaflet(s). Severe mitral annular calcification. Moderate mitral valve regurgitation. No evidence of mitral valve stenosis. MV peak gradient, 7.5 mmHg. The mean mitral valve gradient is 3.0 mmHg. Tricuspid Valve: The tricuspid valve is normal in structure. Tricuspid valve regurgitation is  mild to moderate. No evidence of tricuspid stenosis. Aortic Valve: The aortic valve is normal in structure. Aortic valve regurgitation is not visualized. No aortic stenosis is present. Aortic valve mean gradient measures 5.0 mmHg. Aortic valve peak gradient measures 11.8 mmHg. Aortic valve area, by VTI measures 2.12 cm. Pulmonic Valve: The pulmonic valve was normal in  structure. Pulmonic valve regurgitation is not visualized. No evidence of pulmonic stenosis. Aorta: The aortic root is normal in size and structure. Venous: The inferior vena cava is dilated in size with less than 50% respiratory variability, suggesting right atrial pressure of 15 mmHg. IAS/Shunts: No atrial level shunt detected by color flow Doppler. Additional Comments: 3D was performed not requiring image post processing on an independent workstation and was indeterminate.  LEFT VENTRICLE PLAX 2D LVIDd:         4.60 cm   Diastology LVIDs:         3.10 cm   LV e' lateral:   5.19 cm/s LV PW:         1.40 cm   LV E/e' lateral: 21.8 LV IVS:        1.50 cm LVOT diam:     2.00 cm LV SV:         77 LV SV Index:   38 LVOT Area:     3.14 cm  RIGHT VENTRICLE RV Basal diam:  3.60 cm RV Mid diam:    3.60 cm RV S prime:     12.10 cm/s TAPSE (M-mode): 2.4 cm LEFT ATRIUM             Index        RIGHT ATRIUM           Index LA diam:        3.50 cm 1.72 cm/m   RA Area:     12.30 cm LA Vol (A2C):   80.3 ml 39.49 ml/m  RA Volume:   27.50 ml  13.52 ml/m LA Vol (A4C):   49.3 ml 24.25 ml/m LA Biplane Vol: 63.2 ml 31.08 ml/m  AORTIC VALVE                     PULMONIC VALVE AV Area (Vmax):    2.01 cm      PV Vmax:       0.96 m/s AV Area (Vmean):   2.26 cm      PV Peak grad:  3.7 mmHg AV Area (VTI):     2.12 cm AV Vmax:           172.00 cm/s AV Vmean:          104.000 cm/s AV VTI:            0.365 m AV Peak Grad:      11.8 mmHg AV Mean Grad:      5.0 mmHg LVOT Vmax:         110.00 cm/s LVOT Vmean:        74.700 cm/s LVOT VTI:          0.246 m LVOT/AV VTI ratio: 0.67  AORTA Ao Root diam: 2.90 cm MITRAL VALVE                TRICUSPID VALVE MV Area (PHT): 1.86 cm     TR Peak grad:   31.1 mmHg MV Area VTI:   1.54 cm     TR Vmax:        279.00 cm/s MV Peak grad:  7.5  mmHg MV Mean grad:  3.0 mmHg     SHUNTS MV Vmax:       1.37 m/s     Systemic VTI:  0.25 m MV Vmean:      78.9 cm/s    Systemic Diam: 2.00 cm MV Decel Time: 407 msec  MV E velocity: 113.00 cm/s MV A velocity: 117.00 cm/s MV E/A ratio:  0.97 Belva Boyden MD Electronically signed by Belva Boyden MD Signature Date/Time: 10/08/2023/5:29:12 PM    Final         Scheduled Meds:  Chlorhexidine Gluconate Cloth  6 each Topical Daily   docusate  100 mg Per Tube BID   insulin aspart  0-20 Units Subcutaneous Q4H   levothyroxine  100 mcg Per Tube Q0600   methylPREDNISolone (SOLU-MEDROL) injection  40 mg Intravenous Q12H   OLANZapine  10 mg Per Tube Daily   pantoprazole (PROTONIX) IV  40 mg Intravenous Daily   polyethylene glycol  17 g Per Tube Daily   potassium chloride  40 mEq Oral Once   rivaroxaban  10 mg Per Tube Q supper   rosuvastatin  5 mg Per Tube QHS   Continuous Infusions:  ceFEPime (MAXIPIME) IV Stopped (10/10/23 0104)   linezolid (ZYVOX) IV Stopped (10/10/23 1610)     LOS: 4 days     Raymonde Calico, MD [facility]   If 7PM-7AM, please contact night-coverage [email or web address] Password [facility] 10/10/2023, 12:32 PM

## 2023-10-10 NOTE — Progress Notes (Signed)
 IV obtained by staff RN.

## 2023-10-10 NOTE — Plan of Care (Signed)
 Continuing with plan of care.

## 2023-10-10 NOTE — Plan of Care (Signed)
  Problem: Fluid Volume: Goal: Hemodynamic stability will improve Outcome: Progressing   Problem: Clinical Measurements: Goal: Diagnostic test results will improve Outcome: Progressing   Problem: Respiratory: Goal: Ability to maintain a clear airway and adequate ventilation will improve Outcome: Progressing   Problem: Clinical Measurements: Goal: Ability to maintain clinical measurements within normal limits will improve Outcome: Progressing Goal: Will remain free from infection Outcome: Progressing Goal: Diagnostic test results will improve Outcome: Progressing

## 2023-10-10 NOTE — Progress Notes (Signed)
 Patient's systolic B/P continues in the 161W requiring hydralazine prn with minimal effectiveness.  Provider notified.

## 2023-10-10 NOTE — Progress Notes (Signed)
 Provider notified of elevated B/P, will continue to monitor.

## 2023-10-10 NOTE — Progress Notes (Signed)
 PHARMACY CONSULT NOTE - FOLLOW UP  Pharmacy Consult for Electrolyte Monitoring and Replacement   Recent Labs: Potassium (mmol/L)  Date Value  10/10/2023 3.3 (L)  04/01/2013 4.5   Magnesium (mg/dL)  Date Value  16/03/9603 2.0   Calcium (mg/dL)  Date Value  54/02/8118 9.2   Calcium, Total (mg/dL)  Date Value  14/78/2956 9.4   Albumin (g/dL)  Date Value  21/30/8657 2.9 (L)  04/01/2013 4.0   Phosphorus (mg/dL)  Date Value  84/69/6295 3.3   Sodium (mmol/L)  Date Value  10/10/2023 143  04/01/2013 137     Assessment: 68 y.o. female with PMHx significant for PE on Xarelto, Dementia, CVA with aphasia, renal cell carcinoma s/p right Nephrectomy (12/2007), and CKD Stage III, who is  admitted with Acute Metabolic Encephalopathy, Acute Hypoxic Respiratory Failure, and Severe Sepsis due to RSV Pneumonia and suspected superimposed bacterial HCAP, questionable new onset CHF, along with E. Coli UTI.    Goal of Therapy:  Electrolytes WNL  Plan:  ---10 meq iv KCl x 2 ---recheck electrolytes in am  Adalberto Acton ,PharmD Clinical Pharmacist 10/10/2023 7:07 AM

## 2023-10-10 NOTE — TOC Progression Note (Signed)
 Transition of Care [facility]) - Progression Note    Patient Details  Name: Kylie [name] MRN: [id number] Date of Birth: 11/18/1955  [facility]) CM/SW Contact  Zoe Hinds, RN Phone Number: 10/10/2023, 3:17 PM  Clinical Narrative:    This CM rec'd message from covering MD ready pt's tentative DC tomm back to SNF. This CM called and confirmed with admission liaison Debra at [facility] that pt can admit back tentative tomm .This CM informed pt has active services through [facility] and may be provided DC transportation tomm. CM will cont to follow dc planning needs and update as applicable.    Expected Discharge Plan: Long Term Nursing Home Barriers to Discharge: No Barriers Identified, Continued Medical Work up  Expected Discharge Plan and Services                                               Social Determinants of Health (SDOH) Interventions SDOH Screenings   Food Insecurity: Patient Unable To Answer (10/08/2023)  Housing: Patient Unable To Answer (10/10/2023)  Transportation Needs: Patient Unable To Answer (10/10/2023)  Utilities: Patient Unable To Answer (10/10/2023)  Financial Resource Strain: Low Risk  (09/05/2020)   Received from [facility], [facility]  Physical Activity: Inactive (03/04/2021)   Received from [facility], [facility]  Social Connections: Patient Unable To Answer (10/10/2023)  Tobacco Use: Low Risk  (10/07/2023)  Health Literacy: High Risk (01/21/2021)   Received from [facility], [facility]    Readmission Risk Interventions     No data to display

## 2023-10-11 ENCOUNTER — Ambulatory Visit: Payer: Medicare (Managed Care)

## 2023-10-11 DIAGNOSIS — J189 Pneumonia, unspecified organism: Secondary | ICD-10-CM | POA: Diagnosis not present

## 2023-10-11 DIAGNOSIS — A419 Sepsis, unspecified organism: Secondary | ICD-10-CM | POA: Diagnosis not present

## 2023-10-11 LAB — RENAL FUNCTION PANEL
Albumin: 3 g/dL — ABNORMAL LOW (ref 3.5–5.0)
Anion gap: 11 (ref 5–15)
BUN: 39 mg/dL — ABNORMAL HIGH (ref 8–23)
CO2: 24 mmol/L (ref 22–32)
Calcium: 9.3 mg/dL (ref 8.9–10.3)
Chloride: 107 mmol/L (ref 98–111)
Creatinine, Ser: 1.15 mg/dL — ABNORMAL HIGH (ref 0.44–1.00)
GFR, Estimated: 51 mL/min — ABNORMAL LOW (ref >60)
Glucose, Bld: 105 mg/dL — ABNORMAL HIGH (ref 70–99)
Phosphorus: 2.8 mg/dL (ref 2.5–4.6)
Potassium: 3.3 mmol/L — ABNORMAL LOW (ref 3.5–5.1)
Sodium: 142 mmol/L (ref 135–145)

## 2023-10-11 LAB — CULTURE, BLOOD (ROUTINE X 2)
Culture: NO GROWTH
Culture: NO GROWTH

## 2023-10-11 LAB — GLUCOSE, CAPILLARY
Glucose-Capillary: 80 mg/dL (ref 70–99)
Glucose-Capillary: 91 mg/dL (ref 70–99)
Glucose-Capillary: 138 mg/dL — ABNORMAL HIGH (ref 70–99)

## 2023-10-11 MED ORDER — ACETAMINOPHEN 650 MG RE SUPP
650.0000 mg | Freq: Four times a day (QID) | RECTAL | Status: DC | PRN
Start: 2023-10-11 — End: 2023-10-11

## 2023-10-11 MED ORDER — INSULIN ASPART 100 UNIT/ML IJ SOLN
0.0000 [IU] | Freq: Three times a day (TID) | INTRAMUSCULAR | Status: DC
  Administered 2023-10-11: 2 [IU] via SUBCUTANEOUS
  Filled 2023-10-11: qty 1

## 2023-10-11 MED ORDER — RIVAROXABAN 10 MG PO TABS
10.0000 mg | ORAL_TABLET | Freq: Every day | ORAL | Status: DC
  Filled 2023-10-11: qty 1

## 2023-10-11 MED ORDER — DOCUSATE SODIUM 50 MG/5ML PO LIQD
100.0000 mg | Freq: Two times a day (BID) | ORAL | Status: DC
  Administered 2023-10-11: 100 mg via ORAL
  Filled 2023-10-11: qty 10

## 2023-10-11 MED ORDER — POLYETHYLENE GLYCOL 3350 17 G PO PACK
17.0000 g | PACK | Freq: Every day | ORAL | Status: DC
  Administered 2023-10-11: 17 g via ORAL
  Filled 2023-10-11: qty 1

## 2023-10-11 MED ORDER — LOSARTAN POTASSIUM 50 MG PO TABS
100.0000 mg | ORAL_TABLET | Freq: Every day | ORAL | Status: DC
  Administered 2023-10-11: 100 mg via ORAL
  Filled 2023-10-11: qty 2

## 2023-10-11 MED ORDER — INSULIN ASPART 100 UNIT/ML IJ SOLN: 0.0000 [IU] | Freq: Every day | INTRAMUSCULAR | Status: DC

## 2023-10-11 MED ORDER — CLONIDINE HCL 0.2 MG/24HR TD PTWK
0.2000 mg | MEDICATED_PATCH | TRANSDERMAL | Status: DC
  Administered 2023-10-11: 0.2 mg via TRANSDERMAL
  Filled 2023-10-11: qty 1

## 2023-10-11 MED ORDER — PANTOPRAZOLE SODIUM 40 MG PO TBEC
40.0000 mg | DELAYED_RELEASE_TABLET | Freq: Every day | ORAL | Status: DC
  Administered 2023-10-11: 40 mg via ORAL
  Filled 2023-10-11: qty 1

## 2023-10-11 MED ORDER — POTASSIUM CHLORIDE 20 MEQ PO PACK
40.0000 meq | PACK | Freq: Two times a day (BID) | ORAL | Status: DC
  Administered 2023-10-11: 40 meq via ORAL
  Filled 2023-10-11: qty 2

## 2023-10-11 MED ORDER — ACETAMINOPHEN 325 MG PO TABS: 650.0000 mg | ORAL_TABLET | Freq: Four times a day (QID) | ORAL | Status: DC | PRN

## 2023-10-11 MED ORDER — OLANZAPINE 10 MG PO TABS: 10.0000 mg | ORAL_TABLET | Freq: Every day | ORAL | Status: DC

## 2023-10-11 MED ORDER — LEVOTHYROXINE SODIUM 100 MCG PO TABS: 100.0000 ug | ORAL_TABLET | Freq: Every day | ORAL | Status: DC

## 2023-10-11 NOTE — Progress Notes (Signed)
 This RN provided report to [employer], receiving nurse at [facility].

## 2023-10-11 NOTE — Progress Notes (Signed)
 Patient arrieved at 0617 from ICU accompanied by her Nurse Radames Buff and Nurse tech. Will endorse assessment to day shift.

## 2023-10-11 NOTE — Plan of Care (Signed)
  Problem: Fluid Volume: Goal: Hemodynamic stability will improve Outcome: Progressing   Problem: Clinical Measurements: Goal: Diagnostic test results will improve Outcome: Progressing Goal: Signs and symptoms of infection will decrease Outcome: Progressing   Problem: Respiratory: Goal: Ability to maintain adequate ventilation will improve Outcome: Progressing   Problem: Activity: Goal: Ability to tolerate increased activity will improve Outcome: Progressing   Problem: Respiratory: Goal: Ability to maintain a clear airway and adequate ventilation will improve Outcome: Progressing   Problem: Role Relationship: Goal: Method of communication will improve Outcome: Progressing

## 2023-10-11 NOTE — Progress Notes (Addendum)
 PHARMACY CONSULT NOTE - FOLLOW UP  Pharmacy Consult for Electrolyte Monitoring and Replacement   Recent Labs: Potassium (mmol/L)  Date Value  10/11/2023 3.3 (L)  04/01/2013 4.5   Magnesium (mg/dL)  Date Value  47/82/9562 2.0   Calcium (mg/dL)  Date Value  13/01/6577 9.3   Calcium, Total (mg/dL)  Date Value  46/96/2952 9.4   Albumin (g/dL)  Date Value  84/13/2440 3.0 (L)  04/01/2013 4.0   Phosphorus (mg/dL)  Date Value  04/25/2535 2.8   Sodium (mmol/L)  Date Value  10/11/2023 142  04/01/2013 137     Assessment: 68 y.o. female with PMHx significant for PE on Xarelto, Dementia, CVA with aphasia, renal cell carcinoma s/p right Nephrectomy (12/2007), and CKD Stage III, who is  admitted with Acute Metabolic Encephalopathy, Acute Hypoxic Respiratory Failure, and Severe Sepsis due to RSV Pneumonia and suspected superimposed bacterial HCAP, questionable new onset CHF, along with E. Coli UTI.    Lasix PO 40 mg daily, on losartan 100 mg daily,   Goal of Therapy:  Electrolytes WNL  Plan:  Kcl 40 mEq x 2 PO. Pt transferred to medical floor. Pharmacy will sign off. Please re-consult if needed.    Trinidad Funk ,PharmD Clinical Pharmacist 10/11/2023 9:07 AM

## 2023-10-11 NOTE — TOC Progression Note (Addendum)
 Transition of Care [facility]) - Progression Note    Patient Details  Name: Kylie [name] MRN: [id number] Date of Birth: May 12, 1956  [facility]) CM/SW Contact  Baird Bombard, RN Phone Number: 10/11/2023, 10:30 AM  Clinical Narrative:    Jody Mura at [facility] to advise patient will discharge today.   Spoke with Ammon Bales at [facility] to advised patient is discharging today. She was advised patient is on room air and is max assist for bed mobility and transfers per PT note. Ammon Bales is requesting EMS transport for patient's return to facility.  FL2 completed.    Expected Discharge Plan: Long Term Nursing Home Barriers to Discharge: No Barriers Identified, Continued Medical Work up  Expected Discharge Plan and Services                                               Social Determinants of Health (SDOH) Interventions SDOH Screenings   Food Insecurity: Patient Unable To Answer (10/08/2023)  Housing: Patient Unable To Answer (10/11/2023)  Transportation Needs: Patient Unable To Answer (10/10/2023)  Utilities: Patient Unable To Answer (10/10/2023)  Financial Resource Strain: Low Risk  (09/05/2020)   Received from [facility], [facility]  Physical Activity: Inactive (03/04/2021)   Received from [facility]  Social Connections: Patient Unable To Answer (10/11/2023)  Tobacco Use: Low Risk  (10/07/2023)  Health Literacy: High Risk (01/21/2021)   Received from [facility], [facility]    Readmission Risk Interventions     No data to display

## 2023-10-11 NOTE — Discharge Summary (Signed)
 Kylie [name] MVH:[id number] DOB: Mar 12, 1956 DOA: 10/06/2023  PCP: [facility]  Admit date: 10/06/2023 Discharge date: 10/11/2023  Time spent: 35 minutes  Recommendations for Outpatient Follow-up:  Pcp f/u Cardiology f/u as scheduled     Discharge Diagnoses:  Principal Problem:   Sepsis, severe, due to pneumonia [facility]) Active Problems:   RSV (respiratory syncytial virus pneumonia)   Acute respiratory failure with hypoxia (HCC)   Dementia (HCC)   Hypokalemia   Acquired hypothyroidism   Chronic anticoagulation due to history of pulmonary embolism   Essential hypertension   PE (pulmonary thromboembolism) (HCC)   Type 2 diabetes mellitus without complication, without long-term current use of insulin (HCC)   S/p R nephrectomy for RCC   History of CVA (cerebrovascular accident)   Discharge Condition: stable  Diet recommendation: heart healthy  Filed Weights   10/06/23 2028 10/07/23 1505  Weight: 86 kg 85.3 kg    History of present illness:  From admission h and p Kylie [name] is a 68 y.o. female with medical history significant for prior renal cell carcinoma s/p R nephrectomy (12/2007), HTN,  and dementia, prior CVA with aphasia, diabetes and history of PE on Xarelto, last hospitalized from 2/3 to 08/05/2023 with traumatic rhabdomyolysis, discharged to SNF, was sent from SNF by EMS as a code sepsis.  As reported that they were called out for fever, shortness of breath and increased confusion from baseline.  EMS recorded O2 sat of 70% on room air, placed on CPAP for transport.  Patient unable to contribute to history due to dementia.   Hospital Course:   68 y.o. female with PMHx significant for PE on Xarelto, Dementia, CVA with aphasia, renal cell carcinoma s/p right Nephrectomy (12/2007), and CKD Stage III, who is admitted with Acute Metabolic Encephalopathy, Acute Hypoxic Respiratory Failure, and Severe Sepsis due to RSV Pneumonia and suspected superimposed bacterial  HCAP, questionable new onset CHF, along with E. Coli UTI. Requiring intubation and mechanical ventilation.    4/9: Admitted by [facility] for acute hypoxic respiratory failure and severe sepsis due to RSV pneumonia and suspected superimposed bacterial pneumonia. 4/10: Increasing FiO2 requirements and increased work of breathing.  PCCM consulted.  Ultimately required intubation and mechanical ventilation.  Critically ill. 4/11: No significant events noted overnight.  Vent support weaned to 40% FiO2 and 5 PEEP.  Not requiring vasopressors.  Urine culture resulted with E. Coli.  Diurese with Lasix 40 mg x1 dose.  Echocardiogram pending. 4/12: extubated   # RSV pneumonia # HCAP # Acute hypoxic hypercapnic respiratory failure Now extubated and breathing comfortably on room air. CTA no PE. Treated with 5 day course cefepime/linezolid for possible superimposed hcap   # Dementia No behavioral disturbance. Resides at [facility], will return there.   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 Appears euvolemic - continue home meds - cousin says outpt f/u with [facility] has been scheduled, advise keeping that appointment   # Hypothyroid - home levothyroxine   # Hx PE - home xarelto   # Leg pain Chronic. PVL negative for DVT.   # Hx CVA - xarelto, statin  Procedures: Intubation and mechanical ventilation   Consultations: pccm  Discharge Exam: Vitals:   10/11/23 0636 10/11/23 0823  BP: (!) 172/78 (!) 156/82  Pulse: (!) 59 (!) 59  Resp: 19 18  Temp: 98.1  F (36.7 C) 98.7 F (37.1 C)  SpO2: 100% 99%    General exam: Appears calm and comfortable  Respiratory system: Clear to auscultation.  Safe for few scattered rhonchi Cardiovascular system: S1 & S2 heard, RRR.  Mod harsh systolic murmur Gastrointestinal system: Abdomen is nondistended, soft and  nontender.   Central nervous system: Alert and oriented to self, moving all 4 Extremities: trace LE edema Skin: No rashes, lesions or ulcers Psychiatry: Judgement and insight appear normal. Mood & affect appropriate.  Discharge Instructions   Discharge Instructions     Diet - low sodium heart healthy   Complete by: As directed    Increase activity slowly   Complete by: As directed       Allergies as of 10/11/2023       Reactions   Aspirin    On warfarin   Bee Venom    Lisinopril Cough   Metformin Other (See Comments)   Nausea with significant weight loss   Spironolactone    Hyperkalemia   Wasp Venom Protein         Medication List     STOP taking these medications    meloxicam 7.5 MG tablet Commonly known as: MOBIC       TAKE these medications    acetaminophen 650 MG CR tablet Commonly known as: TYLENOL Take 1,300 mg by mouth in the morning and at bedtime.   amLODipine 10 MG tablet Commonly known as: NORVASC Take 10 mg by mouth daily.   Catapres-TTS-2 0.2 mg/24hr patch Generic drug: cloNIDine Place 0.2 mg onto the skin once a week. Wednesdays   DULoxetine 30 MG capsule Commonly known as: CYMBALTA Take 30 mg by mouth daily.   EPINEPHrine 0.3 mg/0.3 mL Soaj injection Commonly known as: EPI-PEN Inject 0.3 mg into the muscle.   furosemide 40 MG tablet Commonly known as: LASIX Take 40 mg by mouth daily.   gabapentin 100 MG capsule Commonly known as: NEURONTIN Take 100 mg by mouth 2 (two) times daily.   levothyroxine 100 MCG tablet Commonly known as: SYNTHROID TAKE 1 TABLET BY MOUTH EVERY MORNING   lidocaine 4 % external solution Commonly known as: XYLOCAINE Apply topically as needed (both knees).   losartan 100 MG tablet Commonly known as: COZAAR Take 100 mg by mouth daily.   OLANZapine 10 MG tablet Commonly known as: ZYPREXA Take 10 mg by mouth daily.   REFRESH OPTIVE OP Place 1 drop into both eyes in the morning and at bedtime.    rivaroxaban 10 MG Tabs tablet Commonly known as: XARELTO Take 10 mg by mouth daily.   rosuvastatin 5 MG tablet Commonly known as: CRESTOR Take 5 mg by mouth at bedtime.   trolamine salicylate 10 % cream Commonly known as: ASPERCREME Apply 1 Application topically 3 (three) times daily as needed for muscle pain (knee pain).   vitamin D3 25 MCG tablet Commonly known as: CHOLECALCIFEROL Take 1,000 Units by mouth daily.       Allergies  Allergen Reactions   Aspirin     On warfarin   Bee Venom    Lisinopril Cough   Metformin Other (See Comments)    Nausea with significant weight loss   Spironolactone     Hyperkalemia   Wasp Venom Protein     Follow-up Information     [facility] up.   Contact information: [address] [place] [address] [phone]  The results of significant diagnostics from this hospitalization (including imaging, microbiology, ancillary and laboratory) are listed below for reference.    Significant Diagnostic Studies: US Venous Img Lower Bilateral (DVT) Result Date: 10/10/2023 CLINICAL DATA:  144615 Pain 144615 EXAM: BILATERAL LOWER EXTREMITY VENOUS DOPPLER ULTRASOUND TECHNIQUE: Gray-scale sonography with compression, as well as color and duplex ultrasound, were performed to evaluate the deep venous system(s) from the level of the common femoral vein through the popliteal and proximal calf veins. COMPARISON:  None Available. FINDINGS: VENOUS Normal compressibility of the common femoral, superficial femoral, and popliteal veins, as well as the visualized calf veins. Visualized portions of profunda femoral vein and great saphenous vein unremarkable. No filling defects to suggest DVT on grayscale or color Doppler imaging. Doppler waveforms show normal direction of venous flow, normal respiratory plasticity and response to augmentation. OTHER None. Limitations: Technologist describes technically difficult study  secondary to patient inability to move. IMPRESSION: Negative. Electronically Signed   By: Corlis Leak M.D.   On: 10/10/2023 19:00   DG Chest Port 1 View Result Date: 10/09/2023 CLINICAL DATA:  40981 Respiratory failure (HCC) 66501 EXAM: PORTABLE CHEST - 1 VIEW COMPARISON:  10/08/2023 FINDINGS: Endotracheal tube and nasogastric tube remain in place. Persistent left infrahilar atelectasis/consolidation. Minimal opacities at the right lung base. Heart size and mediastinal contours are within normal limits. Aortic Atherosclerosis (ICD10-170.0). Mild blunting of the right lateral costophrenic angle. Visualized bones unremarkable. IMPRESSION: Persistent left infrahilar atelectasis/consolidation. Electronically Signed   By: Corlis Leak M.D.   On: 10/09/2023 08:43   ECHOCARDIOGRAM COMPLETE Result Date: 10/08/2023    ECHOCARDIOGRAM REPORT   Patient Name:   Kylie [name] Date of Exam: 10/08/2023 Medical Rec #:  [id number]       Height:       70.0 in Accession #:    [id number]      Weight:       188.1 lb Date of Birth:  July 06, 1955       BSA:          2.033 m Patient Age:    68 years        BP:           140/71 mmHg Patient Gender: F               HR:           61 bpm. Exam Location:  [facility] Procedure: 2D Echo, Cardiac Doppler and Color Doppler (Both Spectral and Color            Flow Doppler were utilized during procedure). Indications:     Acute respiratory distress  History:         Patient has no prior history of Echocardiogram examinations.                  Stroke; Risk Factors:Hypertension, Diabetes, Dyslipidemia and                  Sleep Apnea. RSV+, Pulmonary embolus.  Sonographer:     Mikki Harbor Referring Phys:  [id number] Judithe Modest Diagnosing Phys: Julien Nordmann MD  Sonographer Comments: Technically difficult study due to poor echo windows and echo performed with patient supine and on artificial respirator. IMPRESSIONS  1. Left ventricular ejection fraction, by estimation, is 40 to 45%. The left  ventricle has mildly decreased function. The left ventricle demonstrates global hypokinesis. There is moderate left ventricular hypertrophy. Left ventricular diastolic parameters are consistent with Grade II diastolic dysfunction (pseudonormalization).  2.  Right ventricular systolic function is normal. The right ventricular size is normal. There is moderately elevated pulmonary artery systolic pressure. The estimated right ventricular systolic pressure is 46.1 mmHg.  3. Left atrial size was mildly dilated.  4. The mitral valve is normal in structure. Moderate mitral valve regurgitation. No evidence of mitral stenosis. Severe mitral annular calcification.  5. Tricuspid valve regurgitation is mild to moderate.  6. The aortic valve is normal in structure. Aortic valve regurgitation is not visualized. No aortic stenosis is present. Aortic valve mean gradient measures 5.0 mmHg.  7. The inferior vena cava is dilated in size with <50% respiratory variability, suggesting right atrial pressure of 15 mmHg. FINDINGS  Left Ventricle: Left ventricular ejection fraction, by estimation, is 40 to 45%. The left ventricle has mildly decreased function. The left ventricle demonstrates global hypokinesis. Strain was performed and the global longitudinal strain is indeterminate. The left ventricular internal cavity size was normal in size. There is moderate left ventricular hypertrophy. Left ventricular diastolic parameters are consistent with Grade II diastolic dysfunction (pseudonormalization). Right Ventricle: The right ventricular size is normal. No increase in right ventricular wall thickness. Right ventricular systolic function is normal. There is moderately elevated pulmonary artery systolic pressure. The tricuspid regurgitant velocity is 2.79 m/s, and with an assumed right atrial pressure of 15 mmHg, the estimated right ventricular systolic pressure is 46.1 mmHg. Left Atrium: Left atrial size was mildly dilated. Right Atrium:  Right atrial size was normal in size. Pericardium: There is no evidence of pericardial effusion. Mitral Valve: The mitral valve is normal in structure. There is moderate calcification of the mitral valve leaflet(s). Severe mitral annular calcification. Moderate mitral valve regurgitation. No evidence of mitral valve stenosis. MV peak gradient, 7.5 mmHg. The mean mitral valve gradient is 3.0 mmHg. Tricuspid Valve: The tricuspid valve is normal in structure. Tricuspid valve regurgitation is mild to moderate. No evidence of tricuspid stenosis. Aortic Valve: The aortic valve is normal in structure. Aortic valve regurgitation is not visualized. No aortic stenosis is present. Aortic valve mean gradient measures 5.0 mmHg. Aortic valve peak gradient measures 11.8 mmHg. Aortic valve area, by VTI measures 2.12 cm. Pulmonic Valve: The pulmonic valve was normal in structure. Pulmonic valve regurgitation is not visualized. No evidence of pulmonic stenosis. Aorta: The aortic root is normal in size and structure. Venous: The inferior vena cava is dilated in size with less than 50% respiratory variability, suggesting right atrial pressure of 15 mmHg. IAS/Shunts: No atrial level shunt detected by color flow Doppler. Additional Comments: 3D was performed not requiring image post processing on an independent workstation and was indeterminate.  LEFT VENTRICLE PLAX 2D LVIDd:         4.60 cm   Diastology LVIDs:         3.10 cm   LV e' lateral:   5.19 cm/s LV PW:         1.40 cm   LV E/e' lateral: 21.8 LV IVS:        1.50 cm LVOT diam:     2.00 cm LV SV:         77 LV SV Index:   38 LVOT Area:     3.14 cm  RIGHT VENTRICLE RV Basal diam:  3.60 cm RV Mid diam:    3.60 cm RV S prime:     12.10 cm/s TAPSE (M-mode): 2.4 cm LEFT ATRIUM             Index  RIGHT ATRIUM           Index LA diam:        3.50 cm 1.72 cm/m   RA Area:     12.30 cm LA Vol (A2C):   80.3 ml 39.49 ml/m  RA Volume:   27.50 ml  13.52 ml/m LA Vol (A4C):   49.3 ml  24.25 ml/m LA Biplane Vol: 63.2 ml 31.08 ml/m  AORTIC VALVE                     PULMONIC VALVE AV Area (Vmax):    2.01 cm      PV Vmax:       0.96 m/s AV Area (Vmean):   2.26 cm      PV Peak grad:  3.7 mmHg AV Area (VTI):     2.12 cm AV Vmax:           172.00 cm/s AV Vmean:          104.000 cm/s AV VTI:            0.365 m AV Peak Grad:      11.8 mmHg AV Mean Grad:      5.0 mmHg LVOT Vmax:         110.00 cm/s LVOT Vmean:        74.700 cm/s LVOT VTI:          0.246 m LVOT/AV VTI ratio: 0.67  AORTA Ao Root diam: 2.90 cm MITRAL VALVE                TRICUSPID VALVE MV Area (PHT): 1.86 cm     TR Peak grad:   31.1 mmHg MV Area VTI:   1.54 cm     TR Vmax:        279.00 cm/s MV Peak grad:  7.5 mmHg MV Mean grad:  3.0 mmHg     SHUNTS MV Vmax:       1.37 m/s     Systemic VTI:  0.25 m MV Vmean:      78.9 cm/s    Systemic Diam: 2.00 cm MV Decel Time: 407 msec MV E velocity: 113.00 cm/s MV A velocity: 117.00 cm/s MV E/A ratio:  0.97 Belva Boyden MD Electronically signed by Belva Boyden MD Signature Date/Time: 10/08/2023/5:29:12 PM    Final    DG Chest Port 1 View Result Date: 10/08/2023 CLINICAL DATA:  Respiratory failure EXAM: PORTABLE CHEST 1 VIEW COMPARISON:  10/07/2023 FINDINGS: Endotracheal tube terminating 2.5 cm above carina. Nasogastric tube extends beyond the inferior aspect of the film. Midline trachea. Mild cardiomegaly. Atherosclerosis in the transverse aorta. No pleural effusion or pneumothorax. Interstitial edema is mild, improved. Multifocal airspace disease is primarily resolved with minimal left suprahilar and left lower lobe opacity remaining. IMPRESSION: Improved congestive heart failure. Improved airspace disease which is likely alveolar edema. Concurrent infection could look similar. Aortic Atherosclerosis (ICD10-I70.0). Electronically Signed   By: Lore Rode M.D.   On: 10/08/2023 10:01   DG Abd Portable 1 View Result Date: 10/07/2023 CLINICAL DATA:  NG tube placement EXAM: PORTABLE ABDOMEN  - 1 VIEW COMPARISON:  None Available. FINDINGS: NG tube tip likely in the proximal duodenum. Nonobstructive bowel gas pattern. IVC filter in place. IMPRESSION: NG tube tip is in the proximal duodenum. Electronically Signed   By: Janeece Mechanic M.D.   On: 10/07/2023 12:21   DG Chest Portable 1 View Result Date: 10/07/2023 CLINICAL DATA:  Post intubation EXAM: PORTABLE CHEST 1  VIEW COMPARISON:  10/06/2023 FINDINGS: Endotracheal tube tip is 3 cm above the carina. NG tube enters the stomach. Heart and mediastinal contours within normal limits. Worsening patchy bilateral airspace disease, most confluent in the left upper lobe compatible with multifocal pneumonia. No significant effusions. No acute bony abnormality. IMPRESSION: Worsening patchy bilateral airspace disease most concerning for multifocal pneumonia. Endotracheal tube 3 cm above the carina. Electronically Signed   By: Janeece Mechanic M.D.   On: 10/07/2023 12:21   CT Angio Chest Pulmonary Embolism (PE) W or WO Contrast Result Date: 10/07/2023 CLINICAL DATA:  Pulmonary embolism (PE) suspected, high prob. Fever. Hypoxia. EXAM: CT ANGIOGRAPHY CHEST WITH CONTRAST TECHNIQUE: Multidetector CT imaging of the chest was performed using the standard protocol during bolus administration of intravenous contrast. Multiplanar CT image reconstructions and MIPs were obtained to evaluate the vascular anatomy. RADIATION DOSE REDUCTION: This exam was performed according to the departmental dose-optimization program which includes automated exposure control, adjustment of the mA and/or kV according to patient size and/or use of iterative reconstruction technique. CONTRAST:  75mL OMNIPAQUE IOHEXOL 350 MG/ML SOLN COMPARISON:  CT angiography chest from 10/19/2021. FINDINGS: Cardiovascular: No evidence of embolism to the proximal subsegmental pulmonary artery level. Normal cardiac size. No pericardial effusion. No aortic aneurysm. There are coronary artery calcifications, in keeping  with coronary artery disease. There are also mild-to-moderate peripheral atherosclerotic vascular calcifications of thoracic aorta and its major branches. Mediastinum/Nodes: Visualized thyroid gland appears grossly unremarkable. No solid / cystic mediastinal masses. The esophagus is nondistended precluding optimal assessment. No axillary, mediastinal or hilar lymphadenopathy by size criteria. Lungs/Pleura: The central tracheo-bronchial tree is patent. There are diffuse heterogeneous mixed solid and ground-glass opacities throughout bilateral lungs without significant volume loss. There also areas of smooth interlobular and intra lobular septal thickening throughout bilateral lungs. No pleural effusion or pneumothorax. No lung collapse. Evaluation for lung nodule is limited due to extensive lung parenchymal opacities. Upper Abdomen: Partially visualized IVC filter. Remaining visualized upper abdominal viscera within normal limits. Musculoskeletal: The visualized soft tissues of the chest wall are grossly unremarkable. No suspicious osseous lesions. There are mild multilevel degenerative changes in the visualized spine. Review of the MIP images confirms the above findings. IMPRESSION: 1. No embolism to the proximal subsegmental pulmonary artery level. 2. Diffuse heterogeneous opacities throughout bilateral lungs with areas of smooth interlobular and intra lobular septal thickening. No pleural effusion. Findings favor multilobar pneumonia on the background of pulmonary edema. Correlate clinically. Follow-up to clearing is recommended. The 3. Multiple other nonacute observations, as described above. Aortic Atherosclerosis (ICD10-I70.0). Electronically Signed   By: Beula Brunswick M.D.   On: 10/07/2023 10:50   DG Chest Port 1 View Result Date: 10/06/2023 CLINICAL DATA:  Possible sepsis EXAM: PORTABLE CHEST 1 VIEW COMPARISON:  08/02/2023 FINDINGS: Cardiomegaly with mild central congestion. No pleural effusion or  pneumothorax. Aortic atherosclerosis. Heterogeneous asymmetrical airspace disease in the left thorax. IMPRESSION: 1. Cardiomegaly with mild central congestion. 2. Heterogeneous asymmetrical airspace disease in the left thorax, suspect for pneumonia. Electronically Signed   By: Esmeralda Hedge M.D.   On: 10/06/2023 22:08    Microbiology: Recent Results (from the past 240 hours)  Resp panel by RT-PCR (RSV, Flu A&B, Covid) Anterior Nasal Swab     Status: Abnormal   Collection Time: 10/06/23  7:39 PM   Specimen: Anterior Nasal Swab  Result Value Ref Range Status   SARS Coronavirus 2 by RT PCR NEGATIVE NEGATIVE Final    Comment: (NOTE) SARS-CoV-2 target nucleic acids are  NOT DETECTED.  The SARS-CoV-2 RNA is generally detectable in upper respiratory specimens during the acute phase of infection. The lowest concentration of SARS-CoV-2 viral copies this assay can detect is 138 copies/mL. A negative result does not preclude SARS-Cov-2 infection and should not be used as the sole basis for treatment or other patient management decisions. A negative result may occur with  improper specimen collection/handling, submission of specimen other than nasopharyngeal swab, presence of viral mutation(s) within the areas targeted by this assay, and inadequate number of viral copies(<138 copies/mL). A negative result must be combined with clinical observations, patient history, and epidemiological information. The expected result is Negative.  Fact Sheet for Patients:  BloggerCourse.com  Fact Sheet for Healthcare Providers:  SeriousBroker.it  This test is no t yet approved or cleared by the [place] FDA and  has been authorized for detection and/or diagnosis of SARS-CoV-2 by FDA under an Emergency Use Authorization (EUA). This EUA will remain  in effect (meaning this test can be used) for the duration of the COVID-19 declaration under Section 564(b)(1)  of the Act, 21 U.S.C.section 360bbb-3(b)(1), unless the authorization is terminated  or revoked sooner.       Influenza A by PCR NEGATIVE NEGATIVE Final   Influenza B by PCR NEGATIVE NEGATIVE Final    Comment: (NOTE) The Xpert Xpress SARS-CoV-2/FLU/RSV plus assay is intended as an aid in the diagnosis of influenza from Nasopharyngeal swab specimens and should not be used as a sole basis for treatment. Nasal washings and aspirates are unacceptable for Xpert Xpress SARS-CoV-2/FLU/RSV testing.  Fact Sheet for Patients: BloggerCourse.com  Fact Sheet for Healthcare Providers: SeriousBroker.it  This test is not yet approved or cleared by the [place] FDA and has been authorized for detection and/or diagnosis of SARS-CoV-2 by FDA under an Emergency Use Authorization (EUA). This EUA will remain in effect (meaning this test can be used) for the duration of the COVID-19 declaration under Section 564(b)(1) of the Act, 21 U.S.C. section 360bbb-3(b)(1), unless the authorization is terminated or revoked.     Resp Syncytial Virus by PCR POSITIVE (A) NEGATIVE Final    Comment: (NOTE) Fact Sheet for Patients: BloggerCourse.com  Fact Sheet for Healthcare Providers: SeriousBroker.it  This test is not yet approved or cleared by the [place] FDA and has been authorized for detection and/or diagnosis of SARS-CoV-2 by FDA under an Emergency Use Authorization (EUA). This EUA will remain in effect (meaning this test can be used) for the duration of the COVID-19 declaration under Section 564(b)(1) of the Act, 21 U.S.C. section 360bbb-3(b)(1), unless the authorization is terminated or revoked.  Performed at [facility], [address] [place], [place] [address]   Blood Culture (routine x 2)     Status: None   Collection Time: 10/06/23  7:39 PM   Specimen: BLOOD  Result  Value Ref Range Status   Specimen Description BLOOD BLOOD RIGHT ARM  Final   Special Requests   Final    BOTTLES DRAWN AEROBIC AND ANAEROBIC Blood Culture results may not be optimal due to an inadequate volume of blood received in culture bottles   Culture   Final    NO GROWTH 5 DAYS Performed at [facility], [address] [place], [place] [address]    Report Status 10/11/2023 FINAL  Final  Blood Culture (routine x 2)     Status: None   Collection Time: 10/06/23  7:40 PM   Specimen: BLOOD  Result Value Ref Range Status  Specimen Description BLOOD BLOOD LEFT ARM  Final   Special Requests   Final    BOTTLES DRAWN AEROBIC AND ANAEROBIC Blood Culture results may not be optimal due to an inadequate volume of blood received in culture bottles   Culture   Final    NO GROWTH 5 DAYS Performed at [facility], [address] [place], [place] [address]    Report Status 10/11/2023 FINAL  Final  Urine Culture     Status: Abnormal   Collection Time: 10/06/23  8:13 PM   Specimen: Urine, Random  Result Value Ref Range Status   Specimen Description   Final    URINE, RANDOM Performed at [facility], [address] [place], [place] [address]    Special Requests   Final    NONE Reflexed from [id number] Performed at [facility], [address] [place], [place] [address]    Culture >=100,000 COLONIES/mL ESCHERICHIA COLI (A)  Final   Report Status 10/08/2023 FINAL  Final   Organism ID, Bacteria ESCHERICHIA COLI (A)  Final      Susceptibility   Escherichia coli - MIC*    AMPICILLIN <=2 SENSITIVE Sensitive     CEFAZOLIN <=4 SENSITIVE Sensitive     CEFEPIME <=0.12 SENSITIVE Sensitive     CEFTRIAXONE <=0.25 SENSITIVE Sensitive     CIPROFLOXACIN <=0.25 SENSITIVE Sensitive     GENTAMICIN <=1 SENSITIVE Sensitive     IMIPENEM <=0.25 SENSITIVE Sensitive     NITROFURANTOIN <=16 SENSITIVE Sensitive     TRIMETH/SULFA <=20 SENSITIVE Sensitive      AMPICILLIN/SULBACTAM <=2 SENSITIVE Sensitive     PIP/TAZO <=4 SENSITIVE Sensitive ug/mL    * >=100,000 COLONIES/mL ESCHERICHIA COLI  MRSA Next Gen by PCR, Nasal     Status: None   Collection Time: 10/07/23 10:09 AM   Specimen: Nasal Mucosa; Nasal Swab  Result Value Ref Range Status   MRSA by PCR Next Gen NOT DETECTED NOT DETECTED Final    Comment: (NOTE) The GeneXpert MRSA Assay (FDA approved for NASAL specimens only), is one component of a comprehensive MRSA colonization surveillance program. It is not intended to diagnose MRSA infection nor to guide or monitor treatment for MRSA infections. Test performance is not FDA approved in patients less than 48 years old. Performed at [facility], [address] [place], [place] [address]      Labs: Basic Metabolic Panel: Recent Labs  Lab 10/07/23 0510 10/08/23 0346 10/09/23 0316 10/10/23 0303 10/11/23 0418  NA 138 137 141 143 142  K 3.5 3.8 3.5 3.3* 3.3*  CL 106 105 110 109 107  CO2 22 20* 21* 21* 24  GLUCOSE 107* 160* 155* 118* 105*  BUN 20 21 37* 45* 39*  CREATININE 0.95 1.23* 1.23* 1.34* 1.15*  CALCIUM 8.6* 8.5* 9.0 9.2 9.3  MG  --  1.7 1.9 2.0  --   PHOS  --  4.8* 3.9  3.9 3.3 2.8   Liver Function Tests: Recent Labs  Lab 10/06/23 1939 10/08/23 0346 10/09/23 0316 10/10/23 0303 10/11/23 0418  AST 34  --   --   --   --   ALT 21  --   --   --   --   ALKPHOS 63  --   --   --   --   BILITOT 1.1  --   --   --   --   PROT 6.7  --   --   --   --  ALBUMIN 3.4* 2.9* 2.6* 2.9* 3.0*   No results for input(s): "LIPASE", "AMYLASE" in the last 168 hours. No results for input(s): "AMMONIA" in the last 168 hours. CBC: Recent Labs  Lab 10/06/23 1939 10/07/23 0510 10/08/23 0346 10/09/23 0316 10/10/23 0303  WBC 17.4* 14.8* 20.4* 11.6* 14.9*  NEUTROABS 15.5*  --   --   --   --   HGB 11.1* 10.7* 11.5* 11.2* 11.2*  HCT 34.7* 32.6* 36.8 32.2* 32.5*  MCV 101.5* 98.2 104.5* 94.7 93.9  PLT 227 214 199 231 282    Cardiac Enzymes: No results for input(s): "CKTOTAL", "CKMB", "CKMBINDEX", "TROPONINI" in the last 168 hours. BNP: BNP (last 3 results) Recent Labs    10/07/23 1302  BNP 632.8*    ProBNP (last 3 results) No results for input(s): "PROBNP" in the last 8760 hours.  CBG: Recent Labs  Lab 10/10/23 1604 10/10/23 1937 10/10/23 2345 10/11/23 0455 10/11/23 0824  GLUCAP 143* 159* 101* 80 91       Signed:  Raymonde Calico MD.  [facility] 10/11/2023, 10:42 AM

## 2023-10-11 NOTE — NC FL2 (Signed)
 [place] MEDICAID FL2 LEVEL OF CARE FORM     IDENTIFICATION  Patient Name: Savi Lastinger Birthdate: [birth year] Sex: female Admission Date (Current Location): 10/06/2023  [facility] and [place] Number:  Chiropodist and Address:  [facility], [address] [place], [place] [address]      Provider Number: [id number]  Attending Physician Name and Address:  Kathrynn Running, MD  Relative Name and Phone Number:  Hilarie Fredrickson (Other)  [phone] (Home Phone)    Current Level of Care: Hospital Recommended Level of Care: Skilled Nursing Facility Prior Approval Number:    Date Approved/Denied:   PASRR Number: [id number]  Discharge Plan: SNF    Current Diagnoses: Patient Active Problem List   Diagnosis Date Noted   Hypokalemia 10/07/2023   Sepsis, severe, due to pneumonia (HCC) 10/06/2023   RSV (respiratory syncytial virus pneumonia) 10/06/2023   Acute respiratory failure with hypoxia (HCC) 10/06/2023   Rhabdomyolysis, traumatic 08/03/2023   Hypoglycemia 08/03/2023   S/p R nephrectomy for RCC 08/03/2023   History of pulmonary embolism 08/03/2023   History of CVA (cerebrovascular accident) 08/03/2023   Dementia (HCC)    Chronic anticoagulation due to history of pulmonary embolism 05/25/2017   CVA (cerebral vascular accident) (HCC) 05/25/2017   Hyperlipidemia 05/25/2017   Obesity (BMI 30.0-34.9) 10/22/2016   Type 2 diabetes mellitus without complication, without long-term current use of insulin (HCC) 11/14/2014   Acquired hypothyroidism 02/19/2014   Allergic rhinitis 02/19/2014   Neuropathy 02/19/2014   Primary osteoarthritis of both knees 02/19/2014   Severe episode of recurrent major depressive disorder, without psychotic features (HCC) 02/19/2014   Vitamin B 12 deficiency 02/19/2014   Vitamin D deficiency 02/19/2014   PE (pulmonary thromboembolism) (HCC) 10/03/2012   Malignant neoplasm of kidney (HCC) 03/21/2012   Gallstones  without obstruction of gallbladder 05/08/2009   Sleep apnea 07/05/2000   Essential hypertension 12/06/1996    Orientation RESPIRATION BLADDER Height & Weight     Self  Normal External catheter, Incontinent Weight: 85.3 kg Height:  5\' 10"  (177.8 cm)  BEHAVIORAL SYMPTOMS/MOOD NEUROLOGICAL BOWEL NUTRITION STATUS  Other (Comment) (n/a)   Continent Diet (DYS 3)  AMBULATORY STATUS COMMUNICATION OF NEEDS Skin   Limited Assist Verbally Bruising (eecyhomosis bialteral arms)                       Personal Care Assistance Level of Assistance  Bathing, Dressing, Feeding Bathing Assistance: Limited assistance Feeding assistance: Limited assistance Dressing Assistance: Limited assistance     Functional Limitations Info             SPECIAL CARE FACTORS FREQUENCY  PT (By licensed PT), OT (By licensed OT)     PT Frequency: Min 2x weekly OT Frequency: Min 2x weekly            Contractures Contractures Info: Not present    Additional Factors Info  Code Status, Allergies Code Status Info: FULL Allergies Info: Aspirin, Bee Venom, Lisinopril, Metformin, Spironolactone, Wasp Venom Protein           Current Medications (10/11/2023):  This is the current hospital active medication list Current Facility-Administered Medications  Medication Dose Route Frequency Provider Last Rate Last Admin   acetaminophen (TYLENOL) tablet 650 mg  650 mg Oral Q6H PRN Wouk, Wilfred Curtis, MD       Or   acetaminophen (TYLENOL) suppository 650 mg  650 mg Rectal Q6H PRN Wouk, Wilfred Curtis, MD       albuterol (PROVENTIL) (  2.5 MG/3ML) 0.083% nebulizer solution 2.5 mg  2.5 mg Nebulization Q2H PRN Duncan, Hazel V, MD       amLODipine (NORVASC) tablet 10 mg  10 mg Oral Daily Janeane Mealy, MD   10 mg at 10/11/23 0953   ceFEPIme (MAXIPIME) 2 g in sodium chloride 0.9 % 100 mL IVPB  2 g Intravenous Q12H Wouk, Haynes Lips, MD   Stopped at 10/11/23 0053   Chlorhexidine Gluconate Cloth 2 % PADS 6 each  6  each Topical Daily Kasa, Kurian, MD   6 each at 10/11/23 0956   cloNIDine (CATAPRES - Dosed in mg/24 hr) patch 0.2 mg  0.2 mg Transdermal Weekly Janeane Mealy, MD   0.2 mg at 10/11/23 4098   docusate (COLACE) 50 MG/5ML liquid 100 mg  100 mg Oral BID Janeane Mealy, MD   100 mg at 10/11/23 0951   furosemide (LASIX) tablet 40 mg  40 mg Oral Daily Janeane Mealy, MD   40 mg at 10/11/23 0953   insulin aspart (novoLOG) injection 0-15 Units  0-15 Units Subcutaneous TID WC Wouk, Haynes Lips, MD       insulin aspart (novoLOG) injection 0-5 Units  0-5 Units Subcutaneous QHS Wouk, Haynes Lips, MD       [START ON 10/12/2023] levothyroxine (SYNTHROID) tablet 100 mcg  100 mcg Oral Q0600 Wouk, Haynes Lips, MD       linezolid (ZYVOX) IVPB 600 mg  600 mg Intravenous Q12H Janeane Mealy, MD 300 mL/hr at 10/11/23 1013 600 mg at 10/11/23 1013   losartan (COZAAR) tablet 100 mg  100 mg Oral Daily Janeane Mealy, MD   100 mg at 10/11/23 0953   OLANZapine (ZYPREXA) tablet 10 mg  10 mg Oral Daily Wouk, Haynes Lips, MD       ondansetron (ZOFRAN) tablet 4 mg  4 mg Oral Q6H PRN Duncan, Hazel V, MD       Or   ondansetron (ZOFRAN) injection 4 mg  4 mg Intravenous Q6H PRN Duncan, Hazel V, MD       pantoprazole (PROTONIX) EC tablet 40 mg  40 mg Oral Daily Janeane Mealy, MD   40 mg at 10/11/23 0953   polyethylene glycol (MIRALAX / GLYCOLAX) packet 17 g  17 g Oral Daily Janeane Mealy, MD   17 g at 10/11/23 0953   potassium chloride (KLOR-CON) packet 40 mEq  40 mEq Oral BID Patel, Kishan S, RPH   40 mEq at 10/11/23 1191   rivaroxaban (XARELTO) tablet 10 mg  10 mg Oral Q supper Wouk, Haynes Lips, MD       rosuvastatin (CRESTOR) tablet 5 mg  5 mg Oral QHS Wouk, Haynes Lips, MD         Discharge Medications: Please see discharge summary for a list of discharge medications.  Relevant Imaging Results:  Relevant Lab Results:   Additional Information SS#: [id number]  Brooklyn Alfredo C Nicolemarie Wooley,  RN

## 2023-10-11 NOTE — Progress Notes (Signed)
 Patient transferred to room 237 via bed. Patients only belongings at bedside was her bra and that was transported with her to room 237.

## 2023-10-11 NOTE — TOC Transition Note (Signed)
 Transition of Care [facility]) - Discharge Note   Patient Details  Name: Kylie [name] MRN: [id number] Date of Birth: 22-Jun-1956  Transition of Care [facility]) CM/SW Contact:  Kyleen Villatoro C Dontrae Morini, RN Phone Number: 10/11/2023, 11:06 AM   Clinical Narrative:     Per facility patient admission confirmed for today. Patient assigned room # 319-B Report will be called to [phone] Face sheet and medical necessity forms printed to the floor to be added to the EMS pack EMS arranged " She's number three." Discharge summary and SNF transfer report sent in HUB.  Nurse, and family notified spoke with [name], patient's cousin. TOC signing off.    Final next level of care: Long Term Nursing Home Barriers to Discharge: No Barriers Identified, Continued Medical Work up   Patient Goals and CMS Choice            Discharge Placement                       Discharge Plan and Services Additional resources added to the After Visit Summary for                                       Social Drivers of Health (SDOH) Interventions SDOH Screenings   Food Insecurity: Patient Unable To Answer (10/08/2023)  Housing: Patient Unable To Answer (10/11/2023)  Transportation Needs: Patient Unable To Answer (10/10/2023)  Utilities: Patient Unable To Answer (10/10/2023)  Financial Resource Strain: Low Risk  (09/05/2020)   Received from [facility], [facility]  Physical Activity: Inactive (03/04/2021)   Received from [facility], [facility]  Social Connections: Patient Unable To Answer (10/11/2023)  Tobacco Use: Low Risk  (10/07/2023)  Health Literacy: High Risk (01/21/2021)   Received from [facility],  [facility]     Readmission Risk Interventions     No data to display

## 2023-10-11 NOTE — Evaluation (Signed)
 Physical Therapy Evaluation Patient Details Name: Kylie [name] MRN: [id number] DOB: [birth year] Today's Date: 10/11/2023  History of Present Illness  Kylie [name] is a 68 y.o. female with medical history significant for prior renal cell carcinoma s/p R nephrectomy, HTN,  and dementia, prior CVA with aphasia, diabetes and history of PE on Xarelto, last hospitalized from 2/3 to 08/05/2023 with traumatic rhabdomyolysis.  Patient present to ED for fever, SOB, and increased confusion.   Clinical Impression  Patient supine in bed, asleep but awakens to therapist voice. Agreeable to PT evaluation. Patient confused, only oriented to self at time of evaluation. Hx of dementia at baseline. No family present. Patient reports she requires assist, but unable to provide details. Patient require assist with bed mobility, patient with poor seated balance and unable to obtain upright seated EOB due to patient resistive requesting to return back to bed. Patient overall MAX A for bed mobility. Patient will benefit from skilled acute PT services to address functional impairments (see below for additional) and maximize functional mobility. Anticipate the need for follow up PT services upon acute hospital discharge. Will continue to follow acutely        If plan is discharge home, recommend the following: A lot of help with walking and/or transfers;Two people to help with bathing/dressing/bathroom   Can travel by private vehicle   No    Equipment Recommendations Other (comment) (TBD at next level of care)  Recommendations for Other Services       Functional Status Assessment Patient has had a recent decline in their functional status and demonstrates the ability to make significant improvements in function in a reasonable and predictable amount of time.     Precautions / Restrictions Precautions Precautions: Fall Restrictions Weight Bearing Restrictions Per Provider Order: No      Mobility  Bed  Mobility Overal bed mobility: Needs Assistance Bed Mobility: Rolling, Sidelying to Sit Rolling: Max assist Sidelying to sit: Max assist       General bed mobility comments: Pt require Max A to roll onto R side, patient able to initiate and use of rail to roll, Max A required to complete roll. Pt initially agreeable to try to sit up on EOB, Max A required but unable to get fully upright as patient became resistive with posterior lateral lean requesting to lay back down. Patient require Max A to return to full supine position.    Transfers                   General transfer comment: unable to tolerate    Ambulation/Gait                  Stairs            Wheelchair Mobility     Tilt Bed    Modified Rankin (Stroke Patients Only)       Balance Overall balance assessment: Needs assistance Sitting-balance support: Feet supported, Bilateral upper extremity supported Sitting balance-Leahy Scale: Zero Sitting balance - Comments: Max A; unable to get fully upright due to patient resistive. Postural control: Posterior lean, Right lateral lean                                   Pertinent Vitals/Pain Pain Assessment Pain Assessment: No/denies pain    Home Living Family/patient expects to be discharged to:: Skilled nursing facility  Additional Comments: Pt poor historian, hx of dementia. "im living right here". Per chart review, patient is part of [facility] program and from [facility]    Prior Function Prior Level of Function : Needs assist             Mobility Comments: Pt reports needs assist, but unable to provide details. No family at bedside. ADLs Comments: No family at bedside; unsure of assist required. Patient poor historian     Extremity/Trunk Assessment   Upper Extremity Assessment Upper Extremity Assessment: Generalized weakness    Lower Extremity Assessment Lower Extremity Assessment: Generalized  weakness (pt require assist to move legs to EOB, generalized weakness)       Communication   Communication Communication: No apparent difficulties    Cognition Arousal: Lethargic Behavior During Therapy: WFL for tasks assessed/performed   PT - Cognitive impairments: History of cognitive impairments                                 Cueing       General Comments General comments (skin integrity, edema, etc.): Pt vitals stable throughout session: 168/87, HR: 75, Sp02: 98% on RA.    Exercises     Assessment/Plan    PT Assessment Patient needs continued PT services  PT Problem List Decreased strength;Decreased activity tolerance;Decreased mobility;Decreased balance;Decreased knowledge of use of DME       PT Treatment Interventions      PT Goals (Current goals can be found in the Care Plan section)  Acute Rehab PT Goals PT Goal Formulation: Patient unable to participate in goal setting (PT unable to state goal) Time For Goal Achievement: 10/25/23 Potential to Achieve Goals: Fair    Frequency Min 1X/week     Co-evaluation               AM-PAC PT "6 Clicks" Mobility  Outcome Measure Help needed turning from your back to your side while in a flat bed without using bedrails?: A Lot Help needed moving from lying on your back to sitting on the side of a flat bed without using bedrails?: A Lot Help needed moving to and from a bed to a chair (including a wheelchair)?: A Lot Help needed standing up from a chair using your arms (e.g., wheelchair or bedside chair)?: Total Help needed to walk in hospital room?: Total Help needed climbing 3-5 steps with a railing? : Total 6 Click Score: 9    End of Session   Activity Tolerance: Patient limited by fatigue Patient left: in bed;with bed alarm set;with call bell/phone within reach Nurse Communication: Mobility status PT Visit Diagnosis: Unsteadiness on feet (R26.81);Other abnormalities of gait and mobility  (R26.89);Muscle weakness (generalized) (M62.81);Difficulty in walking, not elsewhere classified (R26.2)    Time: 0981-1914 PT Time Calculation (min) (ACUTE ONLY): 13 min   Charges:   PT Evaluation $PT Eval Low Complexity: 1 Low   PT General Charges $$ ACUTE PT VISIT: 1 Visit         Kylie [name], PT, DPT 10/11/23 9:36 AM

## 2023-10-14 LAB — LEGIONELLA PNEUMOPHILA SEROGP 1 UR AG: L. pneumophila Serogp 1 Ur Ag: NEGATIVE

## 2023-10-29 ENCOUNTER — Ambulatory Visit: Payer: Medicare (Managed Care)

## 2023-12-23 ENCOUNTER — Emergency Department
Admission: EM | Admit: 2023-12-23 | Discharge: 2023-12-24 | Disposition: A | Discharge status: Home / Self Care | Attending: Emergency Medicine | Admitting: Emergency Medicine

## 2023-12-23 ENCOUNTER — Other Ambulatory Visit: Payer: Self-pay

## 2023-12-23 DIAGNOSIS — I10 Essential (primary) hypertension: Secondary | ICD-10-CM | POA: Diagnosis present

## 2023-12-23 DIAGNOSIS — F0390 Unspecified dementia without behavioral disturbance: Secondary | ICD-10-CM | POA: Insufficient documentation

## 2023-12-23 LAB — TROPONIN I (HIGH SENSITIVITY)
Troponin I (High Sensitivity): 20 ng/L — ABNORMAL HIGH (ref <18)
Troponin I (High Sensitivity): 21 ng/L — ABNORMAL HIGH (ref <18)

## 2023-12-23 LAB — CBC
HCT: 36.5 % (ref 36.0–46.0)
Hemoglobin: 11.6 g/dL — ABNORMAL LOW (ref 12.0–15.0)
MCH: 31.5 pg (ref 26.0–34.0)
MCHC: 31.8 g/dL (ref 30.0–36.0)
MCV: 99.2 fL (ref 80.0–100.0)
Platelets: 221 10*3/uL (ref 150–400)
RBC: 3.68 MIL/uL — ABNORMAL LOW (ref 3.87–5.11)
RDW: 13 % (ref 11.5–15.5)
WBC: 6.4 10*3/uL (ref 4.0–10.5)
nRBC: 0 % (ref 0.0–0.2)

## 2023-12-23 LAB — COMPREHENSIVE METABOLIC PANEL WITH GFR
ALT: 19 U/L (ref 0–44)
AST: 25 U/L (ref 15–41)
Albumin: 3.6 g/dL (ref 3.5–5.0)
Alkaline Phosphatase: 71 U/L (ref 38–126)
Anion gap: 11 (ref 5–15)
BUN: 20 mg/dL (ref 8–23)
CO2: 24 mmol/L (ref 22–32)
Calcium: 9.3 mg/dL (ref 8.9–10.3)
Chloride: 104 mmol/L (ref 98–111)
Creatinine, Ser: 0.98 mg/dL (ref 0.44–1.00)
GFR, Estimated: >60 mL/min (ref >60)
Glucose, Bld: 132 mg/dL — ABNORMAL HIGH (ref 70–99)
Potassium: 3.8 mmol/L (ref 3.5–5.1)
Sodium: 139 mmol/L (ref 135–145)
Total Bilirubin: 0.8 mg/dL (ref 0.0–1.2)
Total Protein: 7.1 g/dL (ref 6.5–8.1)

## 2023-12-23 NOTE — ED Notes (Signed)
 Pt assisted to bathroom by this EDT. Pt requires one person assist. Pt urinated in toilet and on self. New brief applied by this EDT and Gracie, RN.

## 2023-12-23 NOTE — ED Notes (Signed)
 called to [facility] Per RN Madison @1031  pm/[facility]/rep jennifer.

## 2023-12-23 NOTE — Discharge Instructions (Signed)
 Please work with your doctor to help manage your blood pressure.

## 2023-12-23 NOTE — ED Triage Notes (Signed)
 Pt to ED via [facility] from [facility]. Facility reports increased BP over 2 days. Pt has clonidine  patch on right arm. Pt with hx of dementia and CVA. Pt alert to self

## 2023-12-23 NOTE — ED Triage Notes (Signed)
 First nurse note: Pt to ED via [facility] from [facility]. BP has been elevated for a few days. Pt has clonidine  patch on. Hx of dementia and CVA. Pt has no complaints. Pt hypertensive with EMS 197/101

## 2023-12-23 NOTE — ED Notes (Signed)
 Pt presented to ED with c/o HTN and nausea. States her nausea has improved. BP 177/119 on arrival to [facility]. Pt A&Ox 2. Hx dementia. From [facility].

## 2023-12-23 NOTE — ED Provider Notes (Signed)
 [facility] Provider Note    Event Date/Time   First MD Initiated Contact with Patient 12/23/23 2124     (approximate)   History   Hypertension   HPI  Kylie [name] is a 68 y.o. female who apparently came to the emergency department today because of concerns for elevated blood pressure.  Patient does have history of dementia so cannot give any significant history.  The patient herself has no concerns.     Physical Exam   Triage Vital Signs: ED Triage Vitals  Encounter Vitals Group     BP 12/23/23 1833 (!) 178/81     Girls Systolic BP Percentile --      Girls Diastolic BP Percentile --      Boys Systolic BP Percentile --      Boys Diastolic BP Percentile --      Pulse Rate 12/23/23 1833 76     Resp 12/23/23 1833 20     Temp 12/23/23 1833 98.8 F (37.1 C)     Temp Source 12/23/23 1833 Oral     SpO2 12/23/23 1833 100 %     Weight --      Height --      Head Circumference --      Peak Flow --      Pain Score 12/23/23 1831 0     Pain Loc --      Pain Education --      Exclude from Growth Chart --     Most recent vital signs: Vitals:   12/23/23 2040 12/23/23 2100  BP:  (!) 170/81  Pulse: 71 72  Resp: 16 19  Temp:    SpO2: 100% 100%   General: Awake, alert, not oriented. CV:  Good peripheral perfusion. Regular rate and rhythm. Resp:  Normal effort. Lungs clear. Abd:  No distention. Non tender.   ED Results / Procedures / Treatments   Labs (all labs ordered are listed, but only abnormal results are displayed) Labs Reviewed  CBC - Abnormal; Notable for the following components:      Result Value   RBC 3.68 (*)    Hemoglobin 11.6 (*)    All other components within normal limits  COMPREHENSIVE METABOLIC PANEL WITH GFR - Abnormal; Notable for the following components:   Glucose, Bld 132 (*)    All other components within normal limits  TROPONIN I (HIGH SENSITIVITY) - Abnormal; Notable for the following components:   Troponin I  (High Sensitivity) 20 (*)    All other components within normal limits  TROPONIN I (HIGH SENSITIVITY) - Abnormal; Notable for the following components:   Troponin I (High Sensitivity) 21 (*)    All other components within normal limits     EKG  I, Guadalupe Eagles, attending physician, personally viewed and interpreted this EKG  EKG Time: 1833 Rate: 80 Rhythm: normal sinus rhythm Axis: normal Intervals: qtc 482 QRS: narrow, LVH ST changes: no st elevation Impression: abnormal ekg   RADIOLOGY None  PROCEDURES:  Critical Care performed: No  MEDICATIONS ORDERED IN ED: Medications - No data to display   IMPRESSION / MDM / ASSESSMENT AND PLAN / ED COURSE  I reviewed the triage vital signs and the nursing notes.                              Differential diagnosis includes, but is not limited to, hypertension, organ damage  Patient's presentation is most  consistent with acute presentation with potential threat to life or bodily function.   Patient presented to the emergency department today apparently for concerns for high blood pressure.  Patient herself has no concerns.  Blood work here without concerning creatinine elevation.  Troponin although slightly elevated but stable on recheck.  At this time I have low concern for hypertensive emergency or urgency.  I do think it is reasonable for patient to be discharged to follow-up with primary care for further blood pressure management.     FINAL CLINICAL IMPRESSION(S) / ED DIAGNOSES   Final diagnoses:  Hypertension, unspecified type        Note:  This document was prepared using Dragon voice recognition software and may include unintentional dictation errors.    Floy Roberts, MD 12/23/23 [phone]

## 2024-01-05 NOTE — Progress Notes (Signed)
 Large Joint Injection: bilateral knee  Date/Time: 01/05/2024 10:30 AM  Performed by: Lorelle Arthea Arch, MD Authorized by: Lorelle Arthea Arch, MD   Needle Size:  22 G Location:  Knee Laterality:  Bilateral Site:  Bilateral knee Medications (Right):  3 mL BUPivacaine HCl 0.5 %; 3 mL lidocaine  1 %; 40 mg triamcinolone acetonide 40 mg/mL Medications (Left):  3 mL BUPivacaine HCl 0.5 %; 3 mL lidocaine  1 %; 40 mg triamcinolone acetonide 40 mg/mL 3

## 2024-01-05 NOTE — Progress Notes (Signed)
 History of Present Illness: The patient is an 68 y.o. female seen in clinic today for follow-up evaluation of her bilateral knees.  Companied by family who assist with history.  Patient has been getting steroid and hyaluronic acid viscosupplementation's while pursuing nonoperative management of her severe bilateral knee osteoarthritis.  They come in today to discuss continuing with conservative measures additional injections and treatment options.  They report the pain is still up to a 10 out of 10 on a daily basis with ambulation but she does get short-term relief after the injections.  She has had a few small falls when trying to get out of bed unassisted at night.  Denies any new injuries to her knees no head strike or loss of consciousness noted.  The patient denies fevers, chills, numbness, tingling, shortness of breath, chest pain, recent illness, or any other trauma.  Past Medical History: Past Medical History:  Diagnosis Date  . Acquired hypothyroidism 02/19/2014  . Anticoagulated on warfarin 05/25/2017  . CKD (chronic kidney disease), stage III (CMS/HHS-HCC)   . CVA (cerebral vascular accident) (CMS/HHS-HCC) 05/25/2017  . Dementia (CMS/HHS-HCC)   . Dementia associated with other underlying disease without behavioral disturbance (CMS/HHS-HCC)    08/21/19  . Depression   . Diabetes mellitus without complication (CMS/HHS-HCC)   . Hyperlipidemia   . Hypertension   . Neuropathy 02/19/2014  . Neuropathy   . Obesity 10/22/2016  . Osteoarthritis   . PE (pulmonary thromboembolism) (CMS/HHS-HCC)    [id number]  . Renal cell carcinoma of right kidney (CMS/HHS-HCC)   . Rhinitis 02/19/2014  . Sleep apnea   . Vitamin B 12 deficiency    [id number]  . Vitamin D  deficiency     Past Surgical History: Past Surgical History:  Procedure Laterality Date  . CATARACT EXTRACTION W/PHACO Left 01/13/2023  . CHOLECYSTECTOMY    . kidney removal Right   . PR COLONOSCOPY W/BIOPSY SINGLE/MULTIPLE      01/25/15, 05/27/21    Past Family History: History reviewed. No pertinent family history.  Medications: Current Outpatient Medications  Medication Sig Dispense Refill  . amLODIPine  (NORVASC ) 10 MG tablet Take 10 mg by mouth once daily    . cloNIDine  (CATAPRES -TTS) 0.2 mg/24 hr patch 0.2 mg    . DULoxetine  (CYMBALTA ) 30 MG DR capsule     . losartan  (COZAAR ) 100 MG tablet Take 1 tablet by mouth every morning    . meloxicam (MOBIC) 7.5 MG tablet Take 7.5 mg by mouth once daily    . OLANZapine  (ZYPREXA ) 10 MG tablet Take 10 mg by mouth once daily    . pravastatin (PRAVACHOL) 40 MG tablet Take 40 mg by mouth once daily    . rivaroxaban  (XARELTO ) 10 mg tablet Take 10 mg by mouth once daily    . rosuvastatin  (CRESTOR ) 5 MG tablet Take 5 mg by mouth at bedtime    . trolamine salicylate (ASPERCREME) 10 % cream 1 Application     No current facility-administered medications for this visit.    Allergies: Allergies  Allergen Reactions  . Spironolactone Other (See Comments)    Hyperkalemia  hyperkalemia  . Aspirin Other (See Comments)    On warfarin  . Lisinopril Cough and Other (See Comments)    cough  . Metformin Other (See Comments)    Nausea with significant weight loss  . Venom-Honey Bee Other (See Comments)  . Venom-Wasp Other (See Comments)     Visit Vitals: Vitals:   01/05/24 1051  BP: 124/86     Review  of Systems:  A comprehensive 14 point ROS was performed, reviewed, and the pertinent orthopaedic findings are documented in the HPI.  Physical Exam: General/Constitutional: No apparent distress: well-nourished and well developed. Eyes: Pupils equal, round with synchronous movement. Respiratory: Non-labored breathing. Cardiac:  Heart rate is regular. Integumentary: No impressive skin lesions present, except as noted in detailed exam. Neuro/Psych: Normal mood and affect, oriented to person, place and time.  Comprehensive Knee Exam: Gait Antalgic with a rolling Hedtke   Alignment Varus bilaterally    Inspection   Right Left  Skin Normal appearance with no obvious deformity.  No ecchymosis or erythema. Normal appearance with no obvious deformity.  No ecchymosis or erythema.  Soft Tissue No focal soft tissue swelling No focal soft tissue swelling  Quad Atrophy None None    Palpation    Right Left  Tenderness Medial joint line and parapatellar anterior knee pain Medial joint line and parapatellar anterior knee pain  Crepitus + patellofemoral and tibiofemoral crepitus + patellofemoral and tibiofemoral crepitus  Effusion None None    Range of Motion   Right Left  Flexion  0-100 0-100  Extension  Full knee extension without hyperextension Full knee extension without hyperextension    Ligamentous Exam   Right Left  Lachman Normal Normal  Valgus 0 Partially correctable varus deformity Noncorrectable varus deformity  Valgus 30 Normal Normal  Varus 0 Normal Normal  Varus 30 Normal Normal  Anterior Drawer Normal Normal  Posterior Drawer Normal Normal    Meniscal Exam   Right Left  Hyperflexion Test Positive Positive  Hyperextension Test Positive Positive  McMurray's Positive Positive    Neurovascular   Right Left  Quadriceps Strength 5/5 5/5  Hamstring Strength 5/5 5/5  Hip Abductor Strength 4/5 4/5  Distal Motor Normal Normal  Distal Sensory Normal light touch sensation Normal light touch sensation  Distal Pulses Normal Normal       Imaging Studies: I have reviewed AP, lateral,sunrise, and flexed PA weight bearing knee X-rays (5 views) of the bilateral knees ordered and taken today in the office show severe degenerative changes and all 3 compartments of both knees with osteophyte formation and joint space narrowing with bone-on-bone articulation medially with varus deformities of both knees with bony erosions, sclerosis and cystic changes.  Kellgren-Lawrence grade 4 in both knees.  No fractures or dislocations noted in either knee.     Assessment:    ICD-10-CM  1. Primary osteoarthritis of both knees  M17.0     Plan: I reviewed the clinical and radiographic findings with the patient and her family.  She has severe bilateral knee bone-on-bone arthritis.  Given her other ongoing medical conditions and neurostatus we have rule down surgical intervention at this time.  They would like to continue pursuing nonoperative management and would like to do another steroid injection today and we will see how she does from the shots and potentially consider ordering more hyaluronic acid viscosupplementation injections.  Patient family agree with this plan have them follow-up in a few months to see how she is doing with her knees all questions answered.  Bilateral knee steroid injection    Consent After discussing the various treatment options for the condition,  It was agreed that a corticosteroid injection would be the next step in treatment.  The nature of and the indications for a corticosteroid and / or local anaesthetic injection were reviewed in detail with the patient today.  The inherent risks of injection including infection, allergic reaction, increased  pain, incomplete relief or temporary relief of symptoms, alterations of blood glucose levels requiring careful monitoring and treatment as indicated, tendon, ligament or articular cartilage rupture or degeneration, nerve injury, skin depigmentation, and/or fatty atrophy were discussed.     Procedure After the risks and benefits of the procedure were explained, consent was given, and time-out was performed. The site for the injection was properly marked and prepped with Chlorhexadine/Isopropyl alcohol solution.      The injection site was anesthetized with ethyl chloride.  Each knee was injected with a 22 gauge 1.5 inch needle.  40 milligrams of Triamcinolone, 3 milliliters of 0.5% Bupivacaine, and 3 milliliters of 1% Lidocaine  using a sterile technique. During injection, there was  unrestricted flow and care was taken not to inject corticosteroid into the skin or subcutaneous tissues.    A sterile band-aide was applied.  Post-injection instructions were given regarding post-procedure care, when to follow up in clinic and what to expect from the procedure.  The patient tolerated the injection well and was discharged without complication.     Portions of this record have been created using Scientist, clinical (histocompatibility and immunogenetics).  Dictation errors have been sought, but may not have been identified and corrected.  Arthea Sheer MD

## 2024-02-07 NOTE — Progress Notes (Signed)
 [facility] Procedure Note  Assessment: Kylie [name] is a 68 y.o. female with PMH significant for bilateral osteoarthritis of the knees (uses a wheelchair), hypothyroidism, CKD stage III, history of CVA with subsequent aphasia (on Xarelto ), dementia, depression, HLD, HTN, neuropathy, obesity, PE (2014), renal cell carcinoma of right kidney s/p radical nephrectomy (2009), B12 deficiency, and vitamin D  deficiency who presents for extraction of tooth #30.  Patient has continued Xarelto  peri-operatively, given it will be a single tooth extraction. Gelfoam will be placed in socket after extraction completed to help with hemostasis and healing.  Plan: - Extraction #30 under local anesthesia. Procedure note listed below - Consent obtained   - Post-operative instructions provided in written and verbal form - Recommend OTC tylenol  and ibuprofen q6h in alternating fashion x 2-3 days and taper as pain improves - Oral care TID - Pt discharged ambulatory - Establish care with General Dentist for comprehensive dental care - Follow-up [facility] PRN  Procedure codes: - D7140: Simple extraction of #30   PMH, medications and allergies were reviewed. No changes noted.   -------------------------------------------------------------------------------------------------------------------  Objective: BP 156/94 (BP Site: R Arm, BP Position: Sitting)   Pulse 60   Ht 162 cm (5' 3.78)   Wt 75.8 kg (167 lb)   LMP  (LMP Unknown)   SpO2 99%   BMI 28.86 kg/m   Physical Examination : NEURO: CN II-XII grossly intact HEENT: No significant facial edema. No erythema. Face is symmetric. No cervical LAD. MIO >82mm. Intraorally the mucous membranes are pink and well perfused.  Fair dentition. Partially edentulous. Generalized marginal gingival erythema and heavy plaque accumulation secondary to poor oral hygiene.  Occlusion is stable and repeatable. FOM soft; uvula midline; oropharynx clear.  CV: RRR PULM: Normal work of  breathing ABD: Non distended  Imaging: Panoramic radiograph: Condyles not captured in their entirety. No abnormal sinus morphology. 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     -------------------------------------------------------------------------------------------------   Description of procedure:  3 carpules of lidocaine  2% with 1:100,000 epinephrine  1 carpules of articaine 4% with 1:100,000 epinephrine   A periosteal elevator was used to reflect the gingival tissue around the tooth. The tooth was then elevated with straight elevators and extracted with forceps. The socket was curetted and irrigated. Gelfoam placed in socket to help with hemostasis. 3-0 chromic interrupted sutures placed. Gauze pack placed for hemostasis.   Complications: None

## 2024-04-27 ENCOUNTER — Inpatient Hospital Stay: Admitting: Anesthesiology

## 2024-04-27 ENCOUNTER — Encounter
Admission: EM | Disposition: A | Discharge status: Skilled Nursing Facility | Payer: Self-pay | Source: Skilled Nursing Facility | Attending: Osteopathic Medicine

## 2024-04-27 ENCOUNTER — Inpatient Hospital Stay
Admission: EM | Admit: 2024-04-27 | Discharge: 2024-05-19 | DRG: 330 | Disposition: A | Discharge status: Skilled Nursing Facility | Source: Skilled Nursing Facility | Attending: Emergency Medicine | Admitting: Emergency Medicine

## 2024-04-27 ENCOUNTER — Other Ambulatory Visit: Payer: Self-pay

## 2024-04-27 ENCOUNTER — Emergency Department

## 2024-04-27 DIAGNOSIS — K559 Vascular disorder of intestine, unspecified: Secondary | ICD-10-CM | POA: Diagnosis present

## 2024-04-27 DIAGNOSIS — E440 Moderate protein-calorie malnutrition: Secondary | ICD-10-CM | POA: Diagnosis present

## 2024-04-27 DIAGNOSIS — D72829 Elevated white blood cell count, unspecified: Secondary | ICD-10-CM | POA: Diagnosis present

## 2024-04-27 DIAGNOSIS — F03C0 Unspecified dementia, severe, without behavioral disturbance, psychotic disturbance, mood disturbance, and anxiety: Secondary | ICD-10-CM | POA: Diagnosis not present

## 2024-04-27 DIAGNOSIS — N179 Acute kidney failure, unspecified: Secondary | ICD-10-CM | POA: Diagnosis present

## 2024-04-27 DIAGNOSIS — Z6827 Body mass index (BMI) 27.0-27.9, adult: Secondary | ICD-10-CM

## 2024-04-27 DIAGNOSIS — R011 Cardiac murmur, unspecified: Secondary | ICD-10-CM | POA: Diagnosis present

## 2024-04-27 DIAGNOSIS — S36892A Contusion of other intra-abdominal organs, initial encounter: Secondary | ICD-10-CM | POA: Diagnosis present

## 2024-04-27 DIAGNOSIS — D62 Acute posthemorrhagic anemia: Secondary | ICD-10-CM | POA: Diagnosis not present

## 2024-04-27 DIAGNOSIS — G8929 Other chronic pain: Secondary | ICD-10-CM | POA: Diagnosis present

## 2024-04-27 DIAGNOSIS — E1122 Type 2 diabetes mellitus with diabetic chronic kidney disease: Secondary | ICD-10-CM | POA: Diagnosis present

## 2024-04-27 DIAGNOSIS — Z86711 Personal history of pulmonary embolism: Secondary | ICD-10-CM | POA: Diagnosis not present

## 2024-04-27 DIAGNOSIS — Z7401 Bed confinement status: Secondary | ICD-10-CM

## 2024-04-27 DIAGNOSIS — I160 Hypertensive urgency: Secondary | ICD-10-CM | POA: Diagnosis present

## 2024-04-27 DIAGNOSIS — K56699 Other intestinal obstruction unspecified as to partial versus complete obstruction: Secondary | ICD-10-CM | POA: Diagnosis present

## 2024-04-27 DIAGNOSIS — E039 Hypothyroidism, unspecified: Secondary | ICD-10-CM | POA: Diagnosis present

## 2024-04-27 DIAGNOSIS — F0393 Unspecified dementia, unspecified severity, with mood disturbance: Secondary | ICD-10-CM | POA: Diagnosis present

## 2024-04-27 DIAGNOSIS — R627 Adult failure to thrive: Secondary | ICD-10-CM | POA: Diagnosis present

## 2024-04-27 DIAGNOSIS — Z888 Allergy status to other drugs, medicaments and biological substances status: Secondary | ICD-10-CM

## 2024-04-27 DIAGNOSIS — K567 Ileus, unspecified: Secondary | ICD-10-CM | POA: Diagnosis not present

## 2024-04-27 DIAGNOSIS — K56691 Other complete intestinal obstruction: Secondary | ICD-10-CM | POA: Diagnosis present

## 2024-04-27 DIAGNOSIS — Z905 Acquired absence of kidney: Secondary | ICD-10-CM

## 2024-04-27 DIAGNOSIS — Z7901 Long term (current) use of anticoagulants: Secondary | ICD-10-CM | POA: Diagnosis not present

## 2024-04-27 DIAGNOSIS — K55019 Acute (reversible) ischemia of small intestine, extent unspecified: Secondary | ICD-10-CM | POA: Diagnosis present

## 2024-04-27 DIAGNOSIS — F32A Depression, unspecified: Secondary | ICD-10-CM | POA: Diagnosis present

## 2024-04-27 DIAGNOSIS — G4730 Sleep apnea, unspecified: Secondary | ICD-10-CM | POA: Diagnosis present

## 2024-04-27 DIAGNOSIS — Z8673 Personal history of transient ischemic attack (TIA), and cerebral infarction without residual deficits: Secondary | ICD-10-CM | POA: Diagnosis not present

## 2024-04-27 DIAGNOSIS — F0390 Unspecified dementia without behavioral disturbance: Secondary | ICD-10-CM | POA: Diagnosis present

## 2024-04-27 DIAGNOSIS — E872 Acidosis, unspecified: Secondary | ICD-10-CM | POA: Diagnosis present

## 2024-04-27 DIAGNOSIS — Z66 Do not resuscitate: Secondary | ICD-10-CM | POA: Diagnosis present

## 2024-04-27 DIAGNOSIS — I2510 Atherosclerotic heart disease of native coronary artery without angina pectoris: Secondary | ICD-10-CM | POA: Diagnosis present

## 2024-04-27 DIAGNOSIS — K460 Unspecified abdominal hernia with obstruction, without gangrene: Secondary | ICD-10-CM | POA: Diagnosis present

## 2024-04-27 DIAGNOSIS — R630 Anorexia: Secondary | ICD-10-CM | POA: Diagnosis not present

## 2024-04-27 DIAGNOSIS — Z515 Encounter for palliative care: Secondary | ICD-10-CM | POA: Diagnosis not present

## 2024-04-27 DIAGNOSIS — E785 Hyperlipidemia, unspecified: Secondary | ICD-10-CM | POA: Diagnosis not present

## 2024-04-27 DIAGNOSIS — E876 Hypokalemia: Secondary | ICD-10-CM | POA: Diagnosis not present

## 2024-04-27 DIAGNOSIS — I130 Hypertensive heart and chronic kidney disease with heart failure and stage 1 through stage 4 chronic kidney disease, or unspecified chronic kidney disease: Secondary | ICD-10-CM | POA: Diagnosis present

## 2024-04-27 DIAGNOSIS — K56601 Complete intestinal obstruction, unspecified as to cause: Secondary | ICD-10-CM

## 2024-04-27 DIAGNOSIS — R52 Pain, unspecified: Secondary | ICD-10-CM | POA: Diagnosis not present

## 2024-04-27 DIAGNOSIS — I700 Atherosclerosis of aorta: Secondary | ICD-10-CM | POA: Diagnosis present

## 2024-04-27 DIAGNOSIS — R9431 Abnormal electrocardiogram [ECG] [EKG]: Secondary | ICD-10-CM | POA: Diagnosis present

## 2024-04-27 DIAGNOSIS — Z886 Allergy status to analgesic agent status: Secondary | ICD-10-CM

## 2024-04-27 DIAGNOSIS — I5042 Chronic combined systolic (congestive) and diastolic (congestive) heart failure: Secondary | ICD-10-CM | POA: Diagnosis present

## 2024-04-27 DIAGNOSIS — N1831 Chronic kidney disease, stage 3a: Secondary | ICD-10-CM | POA: Diagnosis present

## 2024-04-27 DIAGNOSIS — I10 Essential (primary) hypertension: Secondary | ICD-10-CM | POA: Diagnosis not present

## 2024-04-27 DIAGNOSIS — Z7189 Other specified counseling: Secondary | ICD-10-CM | POA: Diagnosis not present

## 2024-04-27 DIAGNOSIS — I639 Cerebral infarction, unspecified: Secondary | ICD-10-CM | POA: Diagnosis not present

## 2024-04-27 DIAGNOSIS — K55029 Acute infarction of small intestine, extent unspecified: Secondary | ICD-10-CM | POA: Diagnosis present

## 2024-04-27 DIAGNOSIS — Z95828 Presence of other vascular implants and grafts: Secondary | ICD-10-CM

## 2024-04-27 DIAGNOSIS — K9189 Other postprocedural complications and disorders of digestive system: Secondary | ICD-10-CM | POA: Diagnosis not present

## 2024-04-27 DIAGNOSIS — E663 Overweight: Secondary | ICD-10-CM | POA: Diagnosis present

## 2024-04-27 DIAGNOSIS — Z79890 Hormone replacement therapy (postmenopausal): Secondary | ICD-10-CM

## 2024-04-27 DIAGNOSIS — Z85528 Personal history of other malignant neoplasm of kidney: Secondary | ICD-10-CM

## 2024-04-27 DIAGNOSIS — F4310 Post-traumatic stress disorder, unspecified: Secondary | ICD-10-CM | POA: Diagnosis present

## 2024-04-27 DIAGNOSIS — Z79899 Other long term (current) drug therapy: Secondary | ICD-10-CM

## 2024-04-27 DIAGNOSIS — G8918 Other acute postprocedural pain: Secondary | ICD-10-CM | POA: Diagnosis not present

## 2024-04-27 HISTORY — PX: BOWEL RESECTION: SHX1257

## 2024-04-27 HISTORY — PX: LAPAROTOMY: SHX154

## 2024-04-27 LAB — URINALYSIS, ROUTINE W REFLEX MICROSCOPIC
Bacteria, UA: NONE SEEN
Bilirubin Urine: NEGATIVE
Glucose, UA: >=500 mg/dL — AB
Hgb urine dipstick: NEGATIVE
Ketones, ur: 5 mg/dL — AB
Nitrite: NEGATIVE
Protein, ur: 30 mg/dL — AB
Specific Gravity, Urine: 1.017 (ref 1.005–1.030)
pH: 6 (ref 5.0–8.0)

## 2024-04-27 LAB — TROPONIN I (HIGH SENSITIVITY)
Troponin I (High Sensitivity): 8 ng/L (ref <18)
Troponin I (High Sensitivity): 13 ng/L (ref <18)

## 2024-04-27 LAB — COMPREHENSIVE METABOLIC PANEL WITH GFR
ALT: 21 U/L (ref 0–44)
AST: 27 U/L (ref 15–41)
Albumin: 4 g/dL (ref 3.5–5.0)
Alkaline Phosphatase: 85 U/L (ref 38–126)
Anion gap: 15 (ref 5–15)
BUN: 29 mg/dL — ABNORMAL HIGH (ref 8–23)
CO2: 23 mmol/L (ref 22–32)
Calcium: 9.6 mg/dL (ref 8.9–10.3)
Chloride: 100 mmol/L (ref 98–111)
Creatinine, Ser: 1.16 mg/dL — ABNORMAL HIGH (ref 0.44–1.00)
GFR, Estimated: 51 mL/min — ABNORMAL LOW (ref >60)
Glucose, Bld: 238 mg/dL — ABNORMAL HIGH (ref 70–99)
Potassium: 3.8 mmol/L (ref 3.5–5.1)
Sodium: 138 mmol/L (ref 135–145)
Total Bilirubin: 1.1 mg/dL (ref 0.0–1.2)
Total Protein: 8.2 g/dL — ABNORMAL HIGH (ref 6.5–8.1)

## 2024-04-27 LAB — CBC WITH DIFFERENTIAL/PLATELET
Abs Immature Granulocytes: 0.13 K/uL — ABNORMAL HIGH (ref 0.00–0.07)
Basophils Absolute: 0 K/uL (ref 0.0–0.1)
Basophils Relative: 0 %
Eosinophils Absolute: 0 K/uL (ref 0.0–0.5)
Eosinophils Relative: 0 %
HCT: 38.7 % (ref 36.0–46.0)
Hemoglobin: 12.6 g/dL (ref 12.0–15.0)
Immature Granulocytes: 1 %
Lymphocytes Relative: 9 %
Lymphs Abs: 1.2 K/uL (ref 0.7–4.0)
MCH: 31.2 pg (ref 26.0–34.0)
MCHC: 32.6 g/dL (ref 30.0–36.0)
MCV: 95.8 fL (ref 80.0–100.0)
Monocytes Absolute: 0.6 K/uL (ref 0.1–1.0)
Monocytes Relative: 5 %
Neutro Abs: 10.5 K/uL — ABNORMAL HIGH (ref 1.7–7.7)
Neutrophils Relative %: 85 %
Platelets: 266 K/uL (ref 150–400)
RBC: 4.04 MIL/uL (ref 3.87–5.11)
RDW: 12.8 % (ref 11.5–15.5)
WBC: 12.4 K/uL — ABNORMAL HIGH (ref 4.0–10.5)
nRBC: 0 % (ref 0.0–0.2)

## 2024-04-27 LAB — HEPARIN LEVEL (UNFRACTIONATED): Heparin Unfractionated: >1.1 [IU]/mL — ABNORMAL HIGH (ref 0.30–0.70)

## 2024-04-27 LAB — PROTIME-INR
INR: 1.1 (ref 0.8–1.2)
Prothrombin Time: 15.3 s — ABNORMAL HIGH (ref 11.4–15.2)

## 2024-04-27 LAB — BRAIN NATRIURETIC PEPTIDE: B Natriuretic Peptide: 44 pg/mL (ref 0.0–100.0)

## 2024-04-27 LAB — LACTIC ACID, PLASMA: Lactic Acid, Venous: 2.2 mmol/L (ref 0.5–1.9)

## 2024-04-27 LAB — TYPE AND SCREEN
ABO/RH(D): B POS
Antibody Screen: NEGATIVE

## 2024-04-27 LAB — APTT: aPTT: 24 s (ref 24–36)

## 2024-04-27 LAB — LIPASE, BLOOD: Lipase: 43 U/L (ref 11–51)

## 2024-04-27 LAB — MAGNESIUM: Magnesium: 1.9 mg/dL (ref 1.7–2.4)

## 2024-04-27 SURGERY — LAPAROTOMY, EXPLORATORY
Anesthesia: General | Site: Abdomen

## 2024-04-27 MED ORDER — IOHEXOL 350 MG/ML SOLN
75.0000 mL | Freq: Once | INTRAVENOUS | Status: AC | PRN
Start: 2024-04-27 — End: 2024-04-27
  Administered 2024-04-27: 75 mL via INTRAVENOUS

## 2024-04-27 MED ORDER — SODIUM CHLORIDE 0.9 % IV SOLN
INTRAVENOUS | Status: AC
  Filled 2024-04-27: qty 2

## 2024-04-27 MED ORDER — LACTATED RINGERS IV SOLN: INTRAVENOUS | Status: DC | PRN

## 2024-04-27 MED ORDER — HYDRALAZINE HCL 20 MG/ML IJ SOLN: 5.0000 mg | INTRAMUSCULAR | Status: DC | PRN

## 2024-04-27 MED ORDER — PHENYLEPHRINE HCL-NACL 20-0.9 MG/250ML-% IV SOLN
INTRAVENOUS | Status: DC | PRN
  Administered 2024-04-27: 60 ug/min via INTRAVENOUS

## 2024-04-27 MED ORDER — INSULIN ASPART 100 UNIT/ML IJ SOLN
INTRAMUSCULAR | Status: AC
  Filled 2024-04-27: qty 1

## 2024-04-27 MED ORDER — OXYCODONE-ACETAMINOPHEN 5-325 MG PO TABS: 1.0000 | ORAL_TABLET | ORAL | Status: DC | PRN

## 2024-04-27 MED ORDER — FENTANYL CITRATE (PF) 100 MCG/2ML IJ SOLN
INTRAMUSCULAR | Status: AC
  Filled 2024-04-27: qty 2

## 2024-04-27 MED ORDER — PHENYLEPHRINE HCL-NACL 20-0.9 MG/250ML-% IV SOLN
INTRAVENOUS | Status: AC
  Filled 2024-04-27: qty 250

## 2024-04-27 MED ORDER — LIDOCAINE HCL (CARDIAC) PF 100 MG/5ML IV SOSY
PREFILLED_SYRINGE | INTRAVENOUS | Status: DC | PRN
  Administered 2024-04-27: 100 mg via INTRAVENOUS

## 2024-04-27 MED ORDER — PHENYLEPHRINE 80 MCG/ML (10ML) SYRINGE FOR IV PUSH (FOR BLOOD PRESSURE SUPPORT)
PREFILLED_SYRINGE | INTRAVENOUS | Status: DC | PRN
  Administered 2024-04-27 – 2024-04-28 (×6): 160 ug via INTRAVENOUS

## 2024-04-27 MED ORDER — ROCURONIUM BROMIDE 100 MG/10ML IV SOLN
INTRAVENOUS | Status: DC | PRN
  Administered 2024-04-27: 50 mg via INTRAVENOUS
  Administered 2024-04-28: 20 mg via INTRAVENOUS
  Administered 2024-04-28 (×2): 10 mg via INTRAVENOUS

## 2024-04-27 MED ORDER — BUPIVACAINE LIPOSOME 1.3 % IJ SUSP
INTRAMUSCULAR | Status: AC
  Filled 2024-04-27: qty 20

## 2024-04-27 MED ORDER — LIDOCAINE HCL (PF) 2 % IJ SOLN
INTRAMUSCULAR | Status: AC
  Filled 2024-04-27: qty 5

## 2024-04-27 MED ORDER — DIPHENHYDRAMINE HCL 50 MG/ML IJ SOLN
12.5000 mg | Freq: Three times a day (TID) | INTRAMUSCULAR | Status: DC | PRN
  Administered 2024-04-30 – 2024-05-02 (×2): 12.5 mg via INTRAVENOUS
  Filled 2024-04-27 (×2): qty 1

## 2024-04-27 MED ORDER — SODIUM CHLORIDE 0.9 % IV SOLN: INTRAVENOUS | Status: DC

## 2024-04-27 MED ORDER — DEXAMETHASONE SOD PHOSPHATE PF 10 MG/ML IJ SOLN
INTRAMUSCULAR | Status: DC | PRN
  Administered 2024-04-27: 5 mg via INTRAVENOUS

## 2024-04-27 MED ORDER — PROTHROMBIN COMPLEX CONC HUMAN 500 UNITS IV KIT
4000.0000 [IU] | PACK | Status: AC
  Administered 2024-04-27: 4000 [IU] via INTRAVENOUS
  Filled 2024-04-27: qty 4000

## 2024-04-27 MED ORDER — LABETALOL HCL 5 MG/ML IV SOLN
10.0000 mg | Freq: Once | INTRAVENOUS | Status: AC
  Administered 2024-04-27: 10 mg via INTRAVENOUS
  Filled 2024-04-27: qty 4

## 2024-04-27 MED ORDER — HYDROMORPHONE HCL 1 MG/ML IJ SOLN
1.0000 mg | Freq: Once | INTRAMUSCULAR | Status: DC
Start: 2024-04-27 — End: 2024-04-27

## 2024-04-27 MED ORDER — PROPOFOL 10 MG/ML IV BOLUS
INTRAVENOUS | Status: DC | PRN
  Administered 2024-04-27: 90 mg via INTRAVENOUS

## 2024-04-27 MED ORDER — INSULIN ASPART 100 UNIT/ML IJ SOLN: 0.0000 [IU] | Freq: Three times a day (TID) | INTRAMUSCULAR | Status: DC

## 2024-04-27 MED ORDER — PANTOPRAZOLE SODIUM 40 MG IV SOLR
40.0000 mg | Freq: Once | INTRAVENOUS | Status: AC
  Administered 2024-04-27: 40 mg via INTRAVENOUS
  Filled 2024-04-27: qty 10

## 2024-04-27 MED ORDER — HYDRALAZINE HCL 20 MG/ML IJ SOLN: 10.0000 mg | INTRAMUSCULAR | Status: DC | PRN

## 2024-04-27 MED ORDER — SUCCINYLCHOLINE CHLORIDE 200 MG/10ML IV SOSY
PREFILLED_SYRINGE | INTRAVENOUS | Status: DC | PRN
  Administered 2024-04-27: 80 mg via INTRAVENOUS

## 2024-04-27 MED ORDER — BUPIVACAINE-EPINEPHRINE (PF) 0.25% -1:200000 IJ SOLN
INTRAMUSCULAR | Status: AC
  Filled 2024-04-27: qty 30

## 2024-04-27 MED ORDER — ONDANSETRON HCL 4 MG/2ML IJ SOLN
INTRAMUSCULAR | Status: AC
  Filled 2024-04-27: qty 2

## 2024-04-27 MED ORDER — HYDROMORPHONE HCL 1 MG/ML IJ SOLN
0.5000 mg | Freq: Once | INTRAMUSCULAR | Status: AC
  Administered 2024-04-27: 0.5 mg via INTRAVENOUS
  Filled 2024-04-27: qty 0.5

## 2024-04-27 MED ORDER — PROPOFOL 10 MG/ML IV BOLUS
INTRAVENOUS | Status: AC
Start: 2024-04-27 — End: 2024-04-27
  Filled 2024-04-27: qty 20

## 2024-04-27 MED ORDER — MORPHINE SULFATE (PF) 2 MG/ML IV SOLN
1.0000 mg | INTRAVENOUS | Status: DC | PRN
  Administered 2024-04-28: 1 mg via INTRAVENOUS
  Filled 2024-04-27: qty 1

## 2024-04-27 MED ORDER — ONDANSETRON HCL 4 MG/2ML IJ SOLN: 4.0000 mg | Freq: Three times a day (TID) | INTRAMUSCULAR | Status: DC | PRN

## 2024-04-27 MED ORDER — SODIUM CHLORIDE 0.9 % IV SOLN
2.0000 g | INTRAVENOUS | Status: AC
  Administered 2024-04-27: 2 g via INTRAVENOUS

## 2024-04-27 MED ORDER — INSULIN ASPART 100 UNIT/ML IJ SOLN
5.0000 [IU] | Freq: Once | INTRAMUSCULAR | Status: AC
Start: 2024-04-27 — End: 2024-04-27
  Administered 2024-04-27: 5 [IU] via SUBCUTANEOUS

## 2024-04-27 MED ORDER — SUCCINYLCHOLINE CHLORIDE 200 MG/10ML IV SOSY
PREFILLED_SYRINGE | INTRAVENOUS | Status: AC
Start: 2024-04-27 — End: 2024-04-27
  Filled 2024-04-27: qty 10

## 2024-04-27 MED ORDER — FENTANYL CITRATE (PF) 100 MCG/2ML IJ SOLN
INTRAMUSCULAR | Status: DC | PRN
  Administered 2024-04-27 – 2024-04-28 (×2): 50 ug via INTRAVENOUS

## 2024-04-27 MED ORDER — ROCURONIUM BROMIDE 10 MG/ML (PF) SYRINGE
PREFILLED_SYRINGE | INTRAVENOUS | Status: AC
  Filled 2024-04-27: qty 10

## 2024-04-27 MED ORDER — SODIUM CHLORIDE (PF) 0.9 % IJ SOLN
INTRAMUSCULAR | Status: AC
  Filled 2024-04-27: qty 10

## 2024-04-27 MED ORDER — INSULIN ASPART 100 UNIT/ML IJ SOLN: 0.0000 [IU] | Freq: Every day | INTRAMUSCULAR | Status: DC

## 2024-04-27 MED ORDER — ACETAMINOPHEN 325 MG PO TABS: 650.0000 mg | ORAL_TABLET | Freq: Four times a day (QID) | ORAL | Status: DC | PRN

## 2024-04-27 SURGICAL SUPPLY — 48 items
CHLORAPREP W/TINT 26 (MISCELLANEOUS) IMPLANT
DRAIN CHANNEL JP 19F RND 3/16 (MISCELLANEOUS) IMPLANT
DRAIN PENROSE .5X12 LATEX STL (DRAIN) IMPLANT
DRAPE LAPAROTOMY 100X77 ABD (DRAPES) ×1 IMPLANT
DRAPE WARM FLUID 44X44 (DRAPES) ×1 IMPLANT
DRSG OPSITE POSTOP 4X12 (GAUZE/BANDAGES/DRESSINGS) IMPLANT
DRSG TEGADERM 4X10 (GAUZE/BANDAGES/DRESSINGS) IMPLANT
DRSG TEGADERM 4X4.75 (GAUZE/BANDAGES/DRESSINGS) IMPLANT
ELECTRODE CAUTERY BLDE TIP 2.5 (TIP) ×1 IMPLANT
ELECTRODE EZSTD 165MM 6.5IN (MISCELLANEOUS) ×1 IMPLANT
ELECTRODE REM PT RTRN 9FT ADLT (ELECTROSURGICAL) ×1 IMPLANT
EVACUATOR SILICONE 100CC (DRAIN) IMPLANT
GAUZE 4X4 16PLY ~~LOC~~+RFID DBL (SPONGE) IMPLANT
GAUZE SPONGE 4X4 12PLY STRL (GAUZE/BANDAGES/DRESSINGS) IMPLANT
GLOVE BIO SURGEON STRL SZ7 (GLOVE) IMPLANT
GLOVE BIO SURGEON STRL SZ7.5 (GLOVE) IMPLANT
GLOVE BIOGEL PI IND STRL 8 (GLOVE) IMPLANT
GLOVE SURG SYN 7.0 PF PI (GLOVE) ×2 IMPLANT
GLOVE SURG SYN 7.5 PF PI (GLOVE) ×2 IMPLANT
GOWN STRL REUS W/ TWL LRG LVL3 (GOWN DISPOSABLE) ×4 IMPLANT
HANDLE SUCTION POOLE (INSTRUMENTS) IMPLANT
LABEL OR SOLS (LABEL) ×1 IMPLANT
LIGASURE IMPACT 36 18CM CVD LR (INSTRUMENTS) IMPLANT
MANIFOLD NEPTUNE II (INSTRUMENTS) ×1 IMPLANT
NDL HYPO 22X1.5 SAFETY MO (MISCELLANEOUS) ×1 IMPLANT
NEEDLE HYPO 22X1.5 SAFETY MO (MISCELLANEOUS) ×1 IMPLANT
PACK BASIN MAJOR ARMC (MISCELLANEOUS) ×1 IMPLANT
PACK COLON CLEAN CLOSURE (MISCELLANEOUS) ×1 IMPLANT
POWDER SURGICEL 3.0 GRAM (HEMOSTASIS) IMPLANT
RELOAD STAPLE 75 3.8 BLU REG (ENDOMECHANICALS) IMPLANT
SEPRAFILM MEMBRANE 5X6 (MISCELLANEOUS) IMPLANT
SOLN 0.9% NACL POUR BTL 1000ML (IV SOLUTION) ×1 IMPLANT
SPONGE T-LAP 18X18 ~~LOC~~+RFID (SPONGE) ×2 IMPLANT
STAPLER PROXIMATE 55 BLUE (STAPLE) IMPLANT
STAPLER PROXIMATE 75MM BLUE (STAPLE) IMPLANT
STAPLER SKIN PROX 35W (STAPLE) ×1 IMPLANT
SUT ETHILON 2 0 FS 18 (SUTURE) IMPLANT
SUT PDS AB 1 CT1 36 (SUTURE) ×2 IMPLANT
SUT PROLENE 2 0 SH DA (SUTURE) IMPLANT
SUT SILK 2-0 18XBRD TIE 12 (SUTURE) ×1 IMPLANT
SUT SILK 3 0 SH CR/8 (SUTURE) ×1 IMPLANT
SUT STRATA 3-0 SH (SUTURE) IMPLANT
SUT VIC AB 3-0 SH 27X BRD (SUTURE) ×1 IMPLANT
SYR 10ML LL (SYRINGE) ×1 IMPLANT
SYR 20ML LL LF (SYRINGE) IMPLANT
TRAP FLUID SMOKE EVACUATOR (MISCELLANEOUS) ×1 IMPLANT
TRAY FOLEY MTR SLVR 16FR STAT (SET/KITS/TRAYS/PACK) ×1 IMPLANT
WATER STERILE IRR 500ML POUR (IV SOLUTION) ×1 IMPLANT

## 2024-04-27 NOTE — Consult Note (Signed)
 Date of Consultation:  04/27/2024  Requesting Physician:  Ronal Lewandowsky, MD  Reason for Consultation:  Ischemic bowel.  History of Present Illness: Kylie [name] is a 68 y.o. female with history of dementia, HTN, CVA, CKD, DM, PE on Xarelto , s/p right nephrectomy in 2009 for right renal cell carcinoma.  She was brought in today from nursing home due to abdominal pain.  EMS reported severe hypertension with BP 280/140.  Unfortunately I do not have other history and the patient is unable to tell me much.  Unclear how long the pain has been going on for.  The patient reports the worst pain is in the right abdomen.  Denies any nausea or vomiting.  Given her significant hypertension, the ED workup included a CTA dissection protocol.  This showed an area of highly suspicious ischemic bowel likely form either mesenteric defect or adhesions causing an internal hernia.  No perforation, but the small bowel involved has wall thickening and surrounding stranding.  Her WBC is 12.4, lactic acid 2.2, Cr 1.16.  Past Medical History: Past Medical History:  Diagnosis Date   Arthritis    CAD in native artery    CKD (chronic kidney disease), stage III (HCC)    Dementia (HCC)    Depression    Hypertension    Hypothyroidism    Pulmonary embolism (HCC) 2014   Renal cell carcinoma of right kidney (HCC) 2009   Sleep apnea    Solitary kidney, acquired 2009   right kidney removed   Stroke [facility])    Type 2 diabetes mellitus (HCC)    no current medications     Past Surgical History: Past Surgical History:  Procedure Laterality Date   CATARACT EXTRACTION W/PHACO Left 01/13/2023   Procedure: CATARACT EXTRACTION PHACO AND INTRAOCULAR LENS PLACEMENT (IOC) LEFT VISION BLUE HEALON 5 13.28 01:06.8;  Surgeon: Mittie Gaskin, MD;  Location: [facility];  Service: Ophthalmology;  Laterality: Left;   NEPHRECTOMY Right 2009   Rt kidney removal Right     Home Medications: Prior to Admission medications    Medication Sig Start Date End Date Taking? Authorizing Provider  acetaminophen  (TYLENOL ) 650 MG CR tablet Take 1,300 mg by mouth in the morning and at bedtime.    [provider]  amLODipine  (NORVASC ) 10 MG tablet Take 10 mg by mouth daily.    [provider]  Carboxymethylcellul-Glycerin (REFRESH OPTIVE OP) Place 1 drop into both eyes in the morning and at bedtime.    [provider]  cloNIDine  (CATAPRES -TTS-2) 0.2 mg/24hr patch Place 0.2 mg onto the skin once a week. Wednesdays    [provider]  DULoxetine  (CYMBALTA ) 30 MG capsule Take 30 mg by mouth daily.    [provider]  EPINEPHrine  0.3 mg/0.3 mL IJ SOAJ injection Inject 0.3 mg into the muscle. Patient not taking: Reported on 01/06/2023 11/24/16   [provider]  furosemide  (LASIX ) 40 MG tablet Take 40 mg by mouth daily. 08/30/23   [provider]  gabapentin (NEURONTIN) 100 MG capsule Take 100 mg by mouth 2 (two) times daily. 09/24/23   [provider]  levothyroxine  (SYNTHROID , LEVOTHROID) 100 MCG tablet TAKE 1 TABLET BY MOUTH EVERY MORNING 09/23/16   [provider]  lidocaine  (XYLOCAINE ) 4 % external solution Apply topically as needed (both knees).    [provider]  losartan  (COZAAR ) 100 MG tablet Take 100 mg by mouth daily.    [provider]  OLANZapine  (ZYPREXA ) 10 MG tablet Take 10 mg by  mouth daily.    [provider]  rivaroxaban  (XARELTO ) 10 MG TABS tablet Take 10 mg by mouth daily.    [provider]  rosuvastatin  (CRESTOR ) 5 MG tablet Take 5 mg by mouth at bedtime.    [provider]  trolamine salicylate (ASPERCREME) 10 % cream Apply 1 Application topically 3 (three) times daily as needed for muscle pain (knee pain).    [provider]  vitamin D3 (CHOLECALCIFEROL ) 25 MCG tablet Take 1,000 Units by mouth daily.    [provider]    Allergies: Allergies  Allergen Reactions    Aspirin     On warfarin   Bee Venom    Lisinopril Cough   Metformin Other (See Comments)    Nausea with significant weight loss   Spironolactone     Hyperkalemia   Wasp Venom Protein     Social History:  reports that she has never smoked. She has never used smokeless tobacco. She reports that she does not drink alcohol and does not use drugs.   Family History: No family history on file.  Review of Systems: Review of Systems  Unable to perform ROS: Dementia    Physical Exam BP (!) 196/179   Pulse 88   Temp 97.6 F (36.4 C)   Resp 16   Ht 5' 10 (1.778 m)   Wt 79.5 kg   SpO2 100%   BMI 25.15 kg/m  CONSTITUTIONAL: No acute distress HEENT:  Normocephalic, atraumatic, extraocular motion intact. NECK: Trachea is midline, and there is no jugular venous distension. RESPIRATORY:  Normal respiratory effort without pathologic use of accessory muscles. CARDIOVASCULAR: Regular rhythm and rate. GI: The abdomen is soft, with some distention, and tender to palpation in the right abdomen, with localized peritonitis.  MUSCULOSKELETAL:  No peripheral edema or cyanosis. SKIN: Skin turgor is normal. There are no pathologic skin lesions.  NEUROLOGIC:  Motor and sensation is grossly normal.  Cranial nerves are grossly intact. PSYCH:  Unable to obtain.  Laboratory Analysis: Results for orders placed or performed during the hospital encounter of 04/27/24 (from the past 24 hours)  CBC with Differential     Status: Abnormal   Collection Time: 04/27/24  6:34 PM  Result Value Ref Range   WBC 12.4 (H) 4.0 - 10.5 K/uL   RBC 4.04 3.87 - 5.11 MIL/uL   Hemoglobin 12.6 12.0 - 15.0 g/dL   HCT 61.2 63.9 - 53.9 %   MCV 95.8 80.0 - 100.0 fL   MCH 31.2 26.0 - 34.0 pg   MCHC 32.6 30.0 - 36.0 g/dL   RDW 87.1 88.4 - 84.4 %   Platelets 266 150 - 400 K/uL   nRBC 0.0 0.0 - 0.2 %   Neutrophils Relative % 85 %   Neutro Abs 10.5 (H) 1.7 - 7.7 K/uL   Lymphocytes Relative 9 %   Lymphs Abs 1.2 0.7 - 4.0  K/uL   Monocytes Relative 5 %   Monocytes Absolute 0.6 0.1 - 1.0 K/uL   Eosinophils Relative 0 %   Eosinophils Absolute 0.0 0.0 - 0.5 K/uL   Basophils Relative 0 %   Basophils Absolute 0.0 0.0 - 0.1 K/uL   Immature Granulocytes 1 %   Abs Immature Granulocytes 0.13 (H) 0.00 - 0.07 K/uL  Comprehensive metabolic panel     Status: Abnormal   Collection Time: 04/27/24  6:34 PM  Result Value Ref Range   Sodium 138 135 - 145 mmol/L   Potassium 3.8 3.5 - 5.1 mmol/L  Chloride 100 98 - 111 mmol/L   CO2 23 22 - 32 mmol/L   Glucose, Bld 238 (H) 70 - 99 mg/dL   BUN 29 (H) 8 - 23 mg/dL   Creatinine, Ser 8.83 (H) 0.44 - 1.00 mg/dL   Calcium  9.6 8.9 - 10.3 mg/dL   Total Protein 8.2 (H) 6.5 - 8.1 g/dL   Albumin 4.0 3.5 - 5.0 g/dL   AST 27 15 - 41 U/L   ALT 21 0 - 44 U/L   Alkaline Phosphatase 85 38 - 126 U/L   Total Bilirubin 1.1 0.0 - 1.2 mg/dL   GFR, Estimated 51 (L) >60 mL/min   Anion gap 15 5 - 15  Lipase, blood     Status: None   Collection Time: 04/27/24  6:34 PM  Result Value Ref Range   Lipase 43 11 - 51 U/L  Troponin I (High Sensitivity)     Status: None   Collection Time: 04/27/24  6:34 PM  Result Value Ref Range   Troponin I (High Sensitivity) 8 <18 ng/L  Magnesium      Status: None   Collection Time: 04/27/24  6:34 PM  Result Value Ref Range   Magnesium  1.9 1.7 - 2.4 mg/dL  Urinalysis, Routine w reflex microscopic -Urine, Clean Catch     Status: Abnormal   Collection Time: 04/27/24  8:41 PM  Result Value Ref Range   Color, Urine STRAW (A) YELLOW   APPearance CLEAR (A) CLEAR   Specific Gravity, Urine 1.017 1.005 - 1.030   pH 6.0 5.0 - 8.0   Glucose, UA >=500 (A) NEGATIVE mg/dL   Hgb urine dipstick NEGATIVE NEGATIVE   Bilirubin Urine NEGATIVE NEGATIVE   Ketones, ur 5 (A) NEGATIVE mg/dL   Protein, ur 30 (A) NEGATIVE mg/dL   Nitrite NEGATIVE NEGATIVE   Leukocytes,Ua TRACE (A) NEGATIVE   RBC / HPF 0-5 0 - 5 RBC/hpf   WBC, UA 0-5 0 - 5 WBC/hpf   Bacteria, UA NONE SEEN  NONE SEEN   Squamous Epithelial / HPF 0-5 0 - 5 /HPF   Mucus PRESENT   Troponin I (High Sensitivity)     Status: None   Collection Time: 04/27/24  9:43 PM  Result Value Ref Range   Troponin I (High Sensitivity) 13 <18 ng/L  Lactic acid, plasma     Status: Abnormal   Collection Time: 04/27/24  9:43 PM  Result Value Ref Range   Lactic Acid, Venous 2.2 (HH) 0.5 - 1.9 mmol/L    Imaging: CT Angio Chest/Abd/Pel for Dissection W and/or Wo Contrast Result Date: 04/27/2024 CLINICAL DATA:  Chest pressure and hypertension. Acute aortic syndrome suspected. EXAM: CT ANGIOGRAPHY CHEST, ABDOMEN AND PELVIS TECHNIQUE: Non-contrast CT of the chest was initially obtained. Multidetector CT imaging through the chest, abdomen and pelvis was performed using the standard protocol during bolus administration of intravenous contrast. Multiplanar reconstructed images and MIPs were obtained and reviewed to evaluate the vascular anatomy. RADIATION DOSE REDUCTION: This exam was performed according to the departmental dose-optimization program which includes automated exposure control, adjustment of the mA and/or kV according to patient size and/or use of iterative reconstruction technique. CONTRAST:  75mL OMNIPAQUE  IOHEXOL  350 MG/ML SOLN COMPARISON:  Chest CT dated 10/07/2023. FINDINGS: Evaluation is limited due to streak artifact caused by patient's arms. CTA CHEST FINDINGS Cardiovascular: There is no cardiomegaly or pericardial effusion. Three-vessel coronary vascular calcification and calcification of the mitral annulus. Moderate atherosclerotic calcification of the thoracic aorta. No aneurysmal dilatation or dissection. The origins  of the great vessels of the aortic arch and the central pulmonary arteries appear patent. Mediastinum/Nodes: No hilar or mediastinal adenopathy. The esophagus is grossly unremarkable. No mediastinal fluid collection. Lungs/Pleura: No focal consolidation, pleural effusion, pneumothorax. The central  airways are patent. Musculoskeletal: Degenerative changes of the spine. No acute osseous pathology. Review of the MIP images confirms the above findings. CTA ABDOMEN AND PELVIS FINDINGS VASCULAR Aorta: Moderate atherosclerotic calcification of the abdominal aorta. No aneurysmal dilatation or dissection. No periaortic fluid collection. Celiac: The celiac trunk and its major branches are patent. SMA: The SMA is patent. Renals: Atherosclerotic calcification of the origin of the left renal artery. The left renal artery is patent. Status post right nephrectomy. IMA: The origin of the IMA is patent. Inflow: Moderate atherosclerotic calcification of the iliac arteries. 80 and arteries are patent. No aneurysmal dilatation or dissection. Veins: An infrarenal IVC filter is noted.  No portal venous gas. Review of the MIP images confirms the above findings. NON-VASCULAR No intra-abdominal free air.  Small free fluid. Hepatobiliary: The liver is unremarkable. There is mild biliary dilatation, post cholecystectomy. Pancreas: The pancreas is grossly unremarkable as visualized. Spleen: Normal in size without focal abnormality. Adrenals/Urinary Tract: Status post right nephrectomy. No hydronephrosis on the left. The urinary bladder is grossly unremarkable. Stomach/Bowel: There is inflammatory changes and thickening of a cluster of small bowel loop in the mid abdomen. There is apparent focal area of mesenteric defect (200/5 and coronal 41/8) with herniation of inflamed bowel loops. The bowel loops measure up to 3.7 cm in diameter. Findings most consistent with a closed loop obstruction likely secondary to an internal hernia. Surgical consult is advised. Several small scattered pockets of air along the wall of the dilated bowel noted which may be intraluminal but concerning for developing bowel ischemia. There is mild colonic diverticulosis. The partially visualized linear structure in the right lower quadrant may represent a normal  appendix (43/8). Lymphatic: No adenopathy. Reproductive: The uterus is grossly unremarkable. No suspicious adnexal masses. Other: None Musculoskeletal: Degenerative changes of the spine. No acute osseous pathology. Review of the MIP images confirms the above findings. IMPRESSION: 1. No aortic aneurysm or dissection. 2. Closed loop small bowel obstruction likely secondary to an internal hernia with findings concerning for developing bowel ischemia. Clinical correlation and surgical consult is advised. 3. Status post right nephrectomy. 4.  Aortic Atherosclerosis (ICD10-I70.0). Electronically Signed   By: Vanetta Chou M.D.   On: 04/27/2024 20:37   CT HEAD WO CONTRAST ( ) Result Date: 04/27/2024 EXAM: CT HEAD WITHOUT CONTRAST 04/27/2024 08:16:19 PM TECHNIQUE: CT of the head was performed without the administration of intravenous contrast. Automated exposure control, iterative reconstruction, and/or weight based adjustment of the mA/kV was utilized to reduce the radiation dose to as low as reasonably achievable. COMPARISON: Head CT 23:25. CLINICAL HISTORY: Headache, fever. FINDINGS: BRAIN AND VENTRICLES: No acute hemorrhage. No evidence of acute infarct. No hydrocephalus. No extra-axial collection. No mass effect or midline shift. There is atrophy and chronic small vessel ischemic changes of the white matter. Carotid vascular calcification. ORBITS: No acute abnormality. SINUSES: Mucosal thickening in the left sphenoid sinus. SOFT TISSUES AND SKULL: No acute soft tissue abnormality. No skull fracture. IMPRESSION: 1. No acute intracranial abnormality. 2. Cerebral atrophy and chronic small vessel ischemic changes of the white matter. Electronically signed by: Luke Bun MD 04/27/2024 08:32 PM EDT RP Workstation: [id number]    Assessment and Plan: This is a 68 y.o. female with acute ischemic bowel.  --Discussed with  the patient's POA, Mr. [name] Penman, about the patient's condition and findings on her CT  scan.  Unfortunately there is a segment of small bowel that appears ischemic with inflammatory changes due to an internal hernia.  Discussed with him that this presents an emergent situation where we would need to take her to the operating room for exploratory laparotomy, likely bowel resection.  Unfortunately not doing anything would result in clinical deterioration and would likely lead to sepsis as the bowel becomes necrotic.   --Mr Penman reports that he's had conversations in the past with the patient and that they have decided that she would be a full code.  Specifically he mentioned that it would be ok to do CPR and shocks as well as intubate if necessary. --After further discussion, the Mr. Eartha has agreed to proceed with surgery.  Will take her to OR tonight emergently. --The patient is on Xarelto .  Have asked the ER team to reverse that for surgery.  Also, given her hypertension, the ICU team has been consulted.  For now plan would be for patient to be admitted by hospitalist team to [facility] unit, with option for upgrading status if needed.  I spent 80 minutes dedicated to the care of this patient on the date of this encounter to include pre-visit review of records, face-to-face time with the patient discussing diagnosis and management, and any post-visit coordination of care.   Aloysius [name], MD [facility] Pg:  [phone]

## 2024-04-27 NOTE — ED Notes (Signed)
 Lab called and asked for a redraw of a green top.

## 2024-04-27 NOTE — ED Notes (Addendum)
 This tech walked in pt room due to call light being on, upon entering pt was up walking to the bathroom. Pt had taken IV out of her arm,took off pure wick,pulse ox and blood pressure cuff. This tech got pt on toilet and then got pt back into the bed, made RN aware of what was going on.

## 2024-04-27 NOTE — H&P (Addendum)
 History and Physical    Kylie [name] FMW:[id number] DOB: Dec 14, 1955 DOA: 04/27/2024  Referring MD/NP/PA:   PCP: [facility]   Patient coming from:  The patient is coming from SNF.     Chief Complaint: Abdominal pain  HPI: Kylie [name] is a 68 y.o. female with medical history significant of dementia, HTN, HLD, sCHF with EF 40-45%, CVA, CKD-3a, depression, DM, PE on Xarelto , s/p right nephrectomy in 2009 for right renal cell carcinoma, who presents with abdominal pain.  Pt is brought in from a nursing home due to abdominal pain.  Patient is a poor historian due to dementia.  It is unclear how long the pain has been going on for.  She reports diffuse abdominal pain, but worset to the right side of abdomen.  Her abdominal pain seems to be severe and constant, not aggravated or alleviated by any known factors.  Denies nausea, vomiting or diarrhea.  No fever or chills.  Patient does not have respiratory distress, cough, SOB noted.  Denies chest pain.  Denies symptoms of UTI.  Per report, patient has elevated blood pressure up to 280/140.  Her Bp is 241/127 which improved to 196/179 after giving 10 mg of IV labetalol .  Patient also received 4000 units of Kcentra for reversing Xarelto  in ED.   Data reviewed independently and ED Course: pt was found to have WBC 12.4, lactic acid 2.2, troponin 8 --> 13, INR 1.1, PTT 24, negative UA, stable renal function, temperature 96.4, 97.6, heart rate 80-90s, RR 31 --> 16, oxygen saturation 100% on room air.  CT of head negative for cute intracranial abnormalities.  Patient is admitted to stepdown as inpatient. Dr. Desiderio of surgery is consulted.  CTA of abdomen/pelvis/chest: 1. No aortic aneurysm or dissection. 2. Closed loop small bowel obstruction likely secondary to an internal hernia with findings concerning for developing bowel ischemia. Clinical correlation and surgical consult is advised. 3. Status post right nephrectomy. 4.  Aortic  Atherosclerosis (ICD10-I70.0).    EKG: I have personally reviewed.  Sinus rhythm, QTc 556, LAE, early R wave progression.   Review of Systems:   General: no fevers, chills, no body weight gain, has fatigue HEENT: no blurry vision, hearing changes or sore throat Respiratory: no dyspnea, coughing, wheezing CV: no chest pain, no palpitations GI: no nausea, vomiting, has abdominal pain, no diarrhea, constipation GU: no dysuria, burning on urination, increased urinary frequency, hematuria  Ext: no leg edema Neuro: no unilateral weakness, numbness, or tingling, no vision change or hearing loss Skin: no rash, no skin tear. MSK: No muscle spasm, no deformity, no limitation of range of movement in spin Heme: No easy bruising.  Travel history: No recent long distant travel.   Allergy:  Allergies  Allergen Reactions   Aspirin     On warfarin   Bee Venom    Lisinopril Cough   Metformin Other (See Comments)    Nausea with significant weight loss   Spironolactone     Hyperkalemia   Wasp Venom Protein     Past Medical History:  Diagnosis Date   Arthritis    CAD in native artery    CKD (chronic kidney disease), stage III (HCC)    Dementia (HCC)    Depression    Hypertension    Hypothyroidism    Pulmonary embolism (HCC) 2014   Renal cell carcinoma of right kidney (HCC) 2009   Sleep apnea    Solitary kidney, acquired 2009   right kidney removed   Stroke (  HCC)    Type 2 diabetes mellitus (HCC)    no current medications    Past Surgical History:  Procedure Laterality Date   CATARACT EXTRACTION W/PHACO Left 01/13/2023   Procedure: CATARACT EXTRACTION PHACO AND INTRAOCULAR LENS PLACEMENT (IOC) LEFT VISION BLUE HEALON 5 13.28 01:06.8;  Surgeon: Mittie Gaskin, MD;  Location: [facility];  Service: Ophthalmology;  Laterality: Left;   NEPHRECTOMY Right 2009   Rt kidney removal Right     Social History:  reports that she has never smoked. She has never used smokeless  tobacco. She reports that she does not drink alcohol and does not use drugs.  Family History: No family history on file.  Could not be reviewed accurately due to dementia  Prior to Admission medications   Medication Sig Start Date End Date Taking? Authorizing Provider  acetaminophen  (TYLENOL ) 650 MG CR tablet Take 1,300 mg by mouth in the morning and at bedtime.    [provider]  amLODipine  (NORVASC ) 10 MG tablet Take 10 mg by mouth daily.    [provider]  Carboxymethylcellul-Glycerin (REFRESH OPTIVE OP) Place 1 drop into both eyes in the morning and at bedtime.    [provider]  cloNIDine  (CATAPRES -TTS-2) 0.2 mg/24hr patch Place 0.2 mg onto the skin once a week. Wednesdays    [provider]  DULoxetine  (CYMBALTA ) 30 MG capsule Take 30 mg by mouth daily.    [provider]  EPINEPHrine  0.3 mg/0.3 mL IJ SOAJ injection Inject 0.3 mg into the muscle. Patient not taking: Reported on 01/06/2023 11/24/16   [provider]  furosemide  (LASIX ) 40 MG tablet Take 40 mg by mouth daily. 08/30/23   [provider]  gabapentin (NEURONTIN) 100 MG capsule Take 100 mg by mouth 2 (two) times daily. 09/24/23   [provider]  levothyroxine  (SYNTHROID , LEVOTHROID) 100 MCG tablet TAKE 1 TABLET BY MOUTH EVERY MORNING 09/23/16   [provider]  lidocaine  (XYLOCAINE ) 4 % external solution Apply topically as needed (both knees).    [provider]  losartan  (COZAAR ) 100 MG tablet Take 100 mg by mouth daily.    [provider]  OLANZapine  (ZYPREXA ) 10 MG tablet Take 10 mg by mouth daily.    [provider]  rivaroxaban  (XARELTO ) 10 MG TABS tablet Take 10 mg by mouth daily.    [provider]  rosuvastatin  (CRESTOR ) 5 MG tablet Take 5 mg by mouth at bedtime.    [provider]  trolamine salicylate (ASPERCREME) 10 % cream Apply 1 Application topically 3 (three) times daily as needed for muscle  pain (knee pain).    [provider]  vitamin D3 (CHOLECALCIFEROL ) 25 MCG tablet Take 1,000 Units by mouth daily.    [provider]    Physical Exam: Vitals:   04/27/24 2130 04/27/24 2145 04/27/24 2200 04/27/24 2215  BP:  (!) 218/126 (!) 202/188 (!) 196/179  Pulse: 94 86 85 88  Resp:  18 (!) 30 16  Temp:      TempSrc:      SpO2: 100% 100% 100% 100%  Weight:      Height:       General: Patient is in acute distress due to abdominal pain HEENT:       Eyes: PERRL, EOMI, no jaundice       ENT: No discharge from the ears and nose, no pharynx injection, no tonsillar enlargement.        Neck: No JVD, no bruit, no mass  felt. Heme: No neck lymph node enlargement. Cardiac: S1/S2, RRR, No murmurs, No gallops or rubs. Respiratory: No rales, wheezing, rhonchi or rubs. GI: has diffusely tenderness, no organomegaly, BS present. GU: No hematuria Ext: No pitting leg edema bilaterally. 1+DP/PT pulse bilaterally. Musculoskeletal: No joint deformities, No joint redness or warmth, no limitation of ROM in spin. Skin: No rashes.  Neuro: Alert, cranial nerves II-XII grossly intact, moves all extremities normally. Psych: Patient is not psychotic, no suicidal or hemocidal ideation.  Labs on Admission: I have personally reviewed following labs and imaging studies  CBC: Recent Labs  Lab 04/27/24 1834  WBC 12.4*  NEUTROABS 10.5*  HGB 12.6  HCT 38.7  MCV 95.8  PLT 266   Basic Metabolic Panel: Recent Labs  Lab 04/27/24 1834  NA 138  K 3.8  CL 100  CO2 23  GLUCOSE 238*  BUN 29*  CREATININE 1.16*  CALCIUM  9.6  MG 1.9   GFR: Estimated Creatinine Clearance: 48.8 mL/min (A) (by C-G formula based on SCr of 1.16 mg/dL (H)). Liver Function Tests: Recent Labs  Lab 04/27/24 1834  AST 27  ALT 21  ALKPHOS 85  BILITOT 1.1  PROT 8.2*  ALBUMIN 4.0   Recent Labs  Lab 04/27/24 1834  LIPASE 43   No results for input(s): AMMONIA in the last 168 hours. Coagulation  Profile: Recent Labs  Lab 04/27/24 2220  INR 1.1   Cardiac Enzymes: No results for input(s): CKTOTAL, CKMB, CKMBINDEX, TROPONINI in the last 168 hours. BNP (last 3 results) No results for input(s): PROBNP in the last 8760 hours. HbA1C: No results for input(s): HGBA1C in the last 72 hours. CBG: No results for input(s): GLUCAP in the last 168 hours. Lipid Profile: No results for input(s): CHOL, HDL, LDLCALC, TRIG, CHOLHDL, LDLDIRECT in the last 72 hours. Thyroid Function Tests: No results for input(s): TSH, T4TOTAL, FREET4, T3FREE, THYROIDAB in the last 72 hours. Anemia Panel: No results for input(s): VITAMINB12, FOLATE, FERRITIN, TIBC, IRON, RETICCTPCT in the last 72 hours. Urine analysis:    Component Value Date/Time   COLORURINE STRAW (A) 04/27/2024 2041   APPEARANCEUR CLEAR (A) 04/27/2024 2041   APPEARANCEUR Clear 04/01/2013 1228   LABSPEC 1.017 04/27/2024 2041   LABSPEC 1.014 04/01/2013 1228   PHURINE 6.0 04/27/2024 2041   GLUCOSEU >=500 (A) 04/27/2024 2041   GLUCOSEU Negative 04/01/2013 1228   HGBUR NEGATIVE 04/27/2024 2041   BILIRUBINUR NEGATIVE 04/27/2024 2041   BILIRUBINUR Negative 04/01/2013 1228   KETONESUR 5 (A) 04/27/2024 2041   PROTEINUR 30 (A) 04/27/2024 2041   NITRITE NEGATIVE 04/27/2024 2041   LEUKOCYTESUR TRACE (A) 04/27/2024 2041   LEUKOCYTESUR Negative 04/01/2013 1228   Sepsis Labs: @LABRCNTIP (procalcitonin:4,lacticidven:4) )No results found for this or any previous visit (from the past 240 hours).   Radiological Exams on Admission:   Assessment/Plan Principal Problem:   Ischemic bowel disease Active Problems:   Closed loop obstruction of intestine (HCC)   Hypertensive urgency   History of pulmonary embolism   Dementia (HCC)   Chronic combined systolic and diastolic CHF (congestive heart failure) (HCC)   Acquired hypothyroidism   CVA (cerebral vascular accident) (HCC)   Essential hypertension    CAD (coronary artery disease)   Leukocytosis   HLD (hyperlipidemia)   Depression   Chronic kidney disease, stage 3a (HCC)   QT prolongation   Assessment and Plan:  Ischemic bowel disease and closed loop obstruction of intestine (HCC): as shown by CTA as above.  Lactic acid 2.2.  Dr. Desiderio of  surgery is consulted, took patient to the OR urgently for surgery.  -will admit to SDU as inpt -NPO. -will hold off all oral medications now. - As needed morphine  for pain - As needed Percocet, Tylenol  for pain when able to take oral meds - IV fluid: Normal saline at 50 cc/h (patient has EF 40-45%, need to be careful with fluid use) - ICU NP, [facility] is aware this pt. If needed, will change to ICU   Hypertensive urgency and history of hypertension: Patient is Lasix , Cozaar , clonidine  patch, amlodipine  at home - Hold oral medications for surgery - IV hydralazine  10 mg every 2 hours for SBP> 165 - If blood pressure is not controlled, will start Cardene gtt.  History of pulmonary embolism -Hold Xarelto  - Received 4000 units of Kcentra in the ED  Dementia [facility]) -Fall precaution  Chronic combined systolic and diastolic CHF (congestive heart failure) (HCC): 2D echo on 10/08/2023 showed EF of 40-45% with grade 2 diastolic dysfunction.  Patient does not have leg edema or JVD.  CHF seems to be concentrated. -Hold Lasix  - Check BNP  Acquired hypothyroidism -Synthroid   CVA (cerebral vascular accident) (HCC) -Hold Crestor  and Xarelto  for now now  CAD (coronary artery disease): Troponin negative x 2 -Hold Crestor  now  Leukocytosis: WBC 12.4, no fever, likely reactive. -Follow-up with CBC  HLD (hyperlipidemia) -Hold Crestor   Depression -Hold Cymbalta  and olanzapine  due to QTc plantation  Chronic kidney disease, stage 3a [facility]): Renal function stable.  Recent baseline creatinine 1.0-1.3.  Her creatinine is 1.16, BUN 29, GFR 51 - Follow-up with BMP  QT prolongation: QTc 556.  Potassium  3.8, magnesium  1.9. - Cymbalta  and olanzapine  is on hold.      DVT ppx: SCD  Code Status: Full code  Family Communication:   Dr. Desiderio discussed with the patient's POA, Mr. [name] Penman.  Disposition Plan:  Anticipate discharge back to previous environment, SNF  Consults called: Dr. Desiderio of surgery  Admission status and Level of care: Stepdown:  as inpt        Dispo: The patient is from: SNF              Anticipated d/c is to: SNF              Anticipated d/c date is: 2 days              Patient currently is not medically stable to d/c.    Severity of Illness:  The appropriate patient status for this patient is INPATIENT. Inpatient status is judged to be reasonable and necessary in order to provide the required intensity of service to ensure the patient's safety. The patient's presenting symptoms, physical exam findings, and initial radiographic and laboratory data in the context of their chronic comorbidities is felt to place them at high risk for further clinical deterioration. Furthermore, it is not anticipated that the patient will be medically stable for discharge from the hospital within 2 midnights of admission.   * I certify that at the point of admission it is my clinical judgment that the patient will require inpatient hospital care spanning beyond 2 midnights from the point of admission due to high intensity of service, high risk for further deterioration and high frequency of surveillance required.*       Date of Service 04/27/2024    Caleb Exon [facility]   If 7PM-7AM, please contact night-coverage [email or web address] 04/27/2024, 11:37 PM

## 2024-04-27 NOTE — Anesthesia Preprocedure Evaluation (Addendum)
 Anesthesia Evaluation  Patient identified by MRN, date of birth, ID band Patient confused  General Assessment Comment:Pt is in distress, somnolent. Aware of location and having abdominal pain.  Is not aware of time. Does not ask questions appropriately. No obvious neuro deficits detected.   Reviewed: Allergy & Precautions, H&P , NPO status , Patient's Chart, lab work & pertinent test results  Airway Mallampati: II  TM Distance: >3 FB Neck ROM: full    Dental no notable dental hx.    Pulmonary sleep apnea  Pulmonary embolism 2014 now on xerolto  tachypnea         Cardiovascular hypertension, + CAD and +CHF (HFmrEF)  Normal cardiovascular exam+ Valvular Problems/Murmurs MR   ECHO 09/2023:  1. Left ventricular ejection fraction, by estimation, is 40 to 45%. The  left ventricle has mildly decreased function. The left ventricle  demonstrates global hypokinesis. There is moderate left ventricular  hypertrophy. Left ventricular diastolic  parameters are consistent with Grade II diastolic dysfunction  (pseudonormalization).   2. Right ventricular systolic function is normal. The right ventricular  size is normal. There is moderately elevated pulmonary artery systolic  pressure. The estimated right ventricular systolic pressure is 46.1 mmHg.   3. Left atrial size was mildly dilated.   4. The mitral valve is normal in structure. Moderate mitral valve  regurgitation. No evidence of mitral stenosis. Severe mitral annular  calcification.   5. Tricuspid valve regurgitation is mild to moderate.   6. The aortic valve is normal in structure. Aortic valve regurgitation is  not visualized. No aortic stenosis is present. Aortic valve mean gradient  measures 5.0 mmHg.   7. The inferior vena cava is dilated in size with <50% respiratory  variability, suggesting right atrial pressure of 15 mmHg.     Neuro/Psych  PSYCHIATRIC DISORDERS     Dementia  CVA    GI/Hepatic negative GI ROS, Neg liver ROS,,,  Endo/Other  diabetes, Type 2Hypothyroidism    Renal/GU Renal InsufficiencyRenal diseaseS/p R nephrectomy for [facility] 2009     Musculoskeletal   Abdominal  (+)  Abdomen: soft.   Peds  Hematology negative hematology ROS (+)   Anesthesia Other Findings Ischemic small bowel Hyperglycemia Anticoagulated- receiving PCC Hypertensive Urgency this evening- s/p 10mg  labetolol. CT HEAD negative. AMS   EKG: Sinus rhythm Left atrial enlargement Left ventricular hypertrophy Prolonged QT interval  Past Medical History: No date: Arthritis No date: CAD in native artery No date: CKD (chronic kidney disease), stage III (HCC) No date: Dementia (HCC) No date: Depression No date: Hypertension No date: Hypothyroidism 2014: Pulmonary embolism (HCC) 2009: Renal cell carcinoma of right kidney (HCC) No date: Sleep apnea 2009: Solitary kidney, acquired     Comment:  right kidney removed No date: Stroke [facility]) No date: Type 2 diabetes mellitus (HCC)     Comment:  no current medications  Past Surgical History: 01/13/2023: CATARACT EXTRACTION W/PHACO; Left     Comment:  Procedure: CATARACT EXTRACTION PHACO AND INTRAOCULAR               LENS PLACEMENT (IOC) LEFT VISION BLUE HEALON 5 13.28               01:06.8;  Surgeon: Mittie Gaskin, MD;  Location:               [facility];  Service: Ophthalmology;                Laterality: Left; 2009: NEPHRECTOMY; Right No date: Rt kidney  removal; Right  BMI    Body Mass Index: 25.15 kg/m      Reproductive/Obstetrics negative OB ROS                              Anesthesia Physical Anesthesia Plan  ASA: 4 and emergent  Anesthesia Plan: General ETT and General   Post-op Pain Management: Ofirmev  IV (intra-op)* and Precedex    Induction: Rapid sequence and Intravenous  PONV Risk Score and Plan: 3 and Ondansetron , Dexamethasone  and Propofol   infusion  Airway Management Planned: Oral ETT  Additional Equipment:   Intra-op Plan:   Post-operative Plan: Extubation in OR  Informed Consent: I have reviewed the patients History and Physical, chart, labs and discussed the procedure including the risks, benefits and alternatives for the proposed anesthesia with the patient or authorized representative who has indicated his/her understanding and acceptance.     Dental Advisory Given and Consent reviewed with POA  Plan Discussed with: CRNA and Surgeon  Anesthesia Plan Comments: (Prn arterial line Cousin, [name] POA consented telephone)         Anesthesia Quick Evaluation

## 2024-04-27 NOTE — ED Notes (Signed)
 Dr. Ernest made aware of pts continuing abdominal pain and elevated BP.

## 2024-04-27 NOTE — ED Triage Notes (Signed)
 Pt BIB [facility] from [facility] due to upper abd pain. EMS reports Hypertensive pressures, the first 280/140. Dementia, alert to self only and is at baseline. EMS gave 100mcg fent, 4mg  zofran , and 10 labetalol .   250/130 72 NSR 99 RA 40RR 22 CO2

## 2024-04-27 NOTE — ED Notes (Signed)
 Arna Bottcher, NP [phone], reports she can be contacted with any updates or questions.

## 2024-04-27 NOTE — ED Provider Notes (Signed)
 [facility] Provider Note    Event Date/Time   First MD Initiated Contact with Patient 04/27/24 1824     (approximate)   History   Hypertension and Abdominal Pain   HPI  Kylie [name] is a 68 y.o. female with hypertension comes in with abdominal pain.  Patient reports severe onset of abdominal pain.  She denies having this previously.  She denies any chest pain, falls in her head but she reports that the pain is in her upper abdomen.  Physical Exam   Triage Vital Signs: ED Triage Vitals  Encounter Vitals Group     BP 04/27/24 1825 (!) 228/126     Girls Systolic BP Percentile --      Girls Diastolic BP Percentile --      Boys Systolic BP Percentile --      Boys Diastolic BP Percentile --      Pulse Rate 04/27/24 1825 83     Resp 04/27/24 1825 (!) 31     Temp 04/27/24 1825 (!) 96.4 F (35.8 C)     Temp Source 04/27/24 1825 Axillary     SpO2 04/27/24 1825 100 %     Weight 04/27/24 1826 175 lb 4.3 oz (79.5 kg)     Height 04/27/24 1826 5' 10 (1.778 m)     Head Circumference --      Peak Flow --      Pain Score 04/27/24 1826 10     Pain Loc --      Pain Education --      Exclude from Growth Chart --     Most recent vital signs: Vitals:   04/27/24 1900 04/27/24 1911  BP:  (!) 241/127  Pulse: 88   Resp:    Temp:    SpO2: 100%      General: Awake, no distress.  CV:  Good peripheral perfusion.  Resp:  Normal effort.  Abd:  No distention.  Tender in the abdomen Other:     ED Results / Procedures / Treatments   Labs (all labs ordered are listed, but only abnormal results are displayed) Labs Reviewed  CBC WITH DIFFERENTIAL/PLATELET - Abnormal; Notable for the following components:      Result Value   WBC 12.4 (*)    Neutro Abs 10.5 (*)    Abs Immature Granulocytes 0.13 (*)    All other components within normal limits  COMPREHENSIVE METABOLIC PANEL WITH GFR - Abnormal; Notable for the following components:   Glucose, Bld 238 (*)     BUN 29 (*)    Creatinine, Ser 1.16 (*)    Total Protein 8.2 (*)    GFR, Estimated 51 (*)    All other components within normal limits  LIPASE, BLOOD  MAGNESIUM   URINALYSIS, ROUTINE W REFLEX MICROSCOPIC  TROPONIN I (HIGH SENSITIVITY)     EKG  My interpretation of EKG:  Normal sinus rate of 84 without any ST elevation or T wave versions, normal intervals  RADIOLOGY I have reviewed the CT  personally and interpreted no ICH    PROCEDURES:  Critical Care performed: Yes, see critical care procedure note(s)  .1-3 Lead EKG Interpretation  Performed by: Ernest Ronal BRAVO, MD Authorized by: Ernest Ronal BRAVO, MD     Interpretation: normal     ECG rate:  80   ECG rate assessment: normal     Rhythm: sinus rhythm     Ectopy: none     Conduction: normal   .Critical Care  Performed by: Ernest Ronal BRAVO, MD Authorized by: Ernest Ronal BRAVO, MD   Critical care provider statement:    Critical care time (minutes):  30   Critical care was necessary to treat or prevent imminent or life-threatening deterioration of the following conditions: htn, bowel obstruction.   Critical care was time spent personally by me on the following activities:  Development of treatment plan with patient or surrogate, discussions with consultants, evaluation of patient's response to treatment, examination of patient, ordering and review of laboratory studies, ordering and review of radiographic studies, ordering and performing treatments and interventions, pulse oximetry, re-evaluation of patient's condition and review of old charts    MEDICATIONS ORDERED IN ED: Medications  prothrombin complex conc human (KCENTRA) IVPB 4,000 Units (has no administration in time range)  pantoprazole  (PROTONIX ) injection 40 mg (40 mg Intravenous Given 04/27/24 1846)  HYDROmorphone  (DILAUDID ) injection 0.5 mg (0.5 mg Intravenous Given 04/27/24 1846)  labetalol  (NORMODYNE ) injection 10 mg (10 mg Intravenous Given 04/27/24 1920)  iohexol   (OMNIPAQUE ) 350 MG/ML injection 75 mL (75 mLs Intravenous Contrast Given 04/27/24 2007)     IMPRESSION / MDM / ASSESSMENT AND PLAN / ED COURSE  I reviewed the triage vital signs and the nursing notes.   Patient's presentation is most consistent with acute presentation with potential threat to life or bodily function.   Patient comes in with significant hypertension with abdominal pain.  CT dissection ordered bypassing labs given patient's significant pain.  Patient was treated with some IV Dilaudid , IV labetalol  given hypertension.  Patient's blood work does show elevated white count of 12.  Cardiac markers are negative.  Urine without evidence of UTI CMP overall reassuring.  CT scan however was concerning for bowel obstruction with ischemic bowel.  Discussed case with Dr. Desiderio.  Adding on lactate and reversing patient's Xarelto  per his recommendation given plan is to proceed with surgical intervention.  He recommend I discussed with the ICU versus the hospitalist.  Discussed with ICU elizabeth who recommended talking with the hospitalist and they will save a bed and stepdown it.  If patient is still intubated after surgery then they can alert the ICU team and switched over to ICU.  The patient is on the cardiac monitor to evaluate for evidence of arrhythmia and/or significant heart rate changes.      FINAL CLINICAL IMPRESSION(S) / ED DIAGNOSES   Final diagnoses:  Uncontrolled hypertension  Ischemic bowel disease  Complete intestinal obstruction, unspecified cause (HCC)     Rx / DC Orders   ED Discharge Orders     None        Note:  This document was prepared using Dragon voice recognition software and may include unintentional dictation errors.   Ernest Ronal BRAVO, MD 04/27/24 2228

## 2024-04-28 ENCOUNTER — Encounter: Payer: Self-pay | Admitting: Internal Medicine

## 2024-04-28 ENCOUNTER — Other Ambulatory Visit: Payer: Self-pay

## 2024-04-28 ENCOUNTER — Inpatient Hospital Stay

## 2024-04-28 DIAGNOSIS — K559 Vascular disorder of intestine, unspecified: Secondary | ICD-10-CM | POA: Diagnosis not present

## 2024-04-28 DIAGNOSIS — K56699 Other intestinal obstruction unspecified as to partial versus complete obstruction: Secondary | ICD-10-CM | POA: Diagnosis not present

## 2024-04-28 LAB — BASIC METABOLIC PANEL WITH GFR
Anion gap: 15 (ref 5–15)
BUN: 29 mg/dL — ABNORMAL HIGH (ref 8–23)
CO2: 22 mmol/L (ref 22–32)
Calcium: 8.5 mg/dL — ABNORMAL LOW (ref 8.9–10.3)
Chloride: 106 mmol/L (ref 98–111)
Creatinine, Ser: 1.27 mg/dL — ABNORMAL HIGH (ref 0.44–1.00)
GFR, Estimated: 45 mL/min — ABNORMAL LOW (ref >60)
Glucose, Bld: 161 mg/dL — ABNORMAL HIGH (ref 70–99)
Potassium: 4.3 mmol/L (ref 3.5–5.1)
Sodium: 143 mmol/L (ref 135–145)

## 2024-04-28 LAB — CBC
HCT: 34 % — ABNORMAL LOW (ref 36.0–46.0)
Hemoglobin: 11.2 g/dL — ABNORMAL LOW (ref 12.0–15.0)
MCH: 31.7 pg (ref 26.0–34.0)
MCHC: 32.9 g/dL (ref 30.0–36.0)
MCV: 96.3 fL (ref 80.0–100.0)
Platelets: 210 K/uL (ref 150–400)
RBC: 3.53 MIL/uL — ABNORMAL LOW (ref 3.87–5.11)
RDW: 13.2 % (ref 11.5–15.5)
WBC: 23.4 K/uL — ABNORMAL HIGH (ref 4.0–10.5)
nRBC: 0 % (ref 0.0–0.2)

## 2024-04-28 LAB — GLUCOSE, CAPILLARY
Glucose-Capillary: 252 mg/dL — ABNORMAL HIGH (ref 70–99)
Glucose-Capillary: 117 mg/dL — ABNORMAL HIGH (ref 70–99)
Glucose-Capillary: 136 mg/dL — ABNORMAL HIGH (ref 70–99)
Glucose-Capillary: 145 mg/dL — ABNORMAL HIGH (ref 70–99)
Glucose-Capillary: 135 mg/dL — ABNORMAL HIGH (ref 70–99)
Glucose-Capillary: 168 mg/dL — ABNORMAL HIGH (ref 70–99)
Glucose-Capillary: 166 mg/dL — ABNORMAL HIGH (ref 70–99)
Glucose-Capillary: 169 mg/dL — ABNORMAL HIGH (ref 70–99)

## 2024-04-28 LAB — MRSA NEXT GEN BY PCR, NASAL: MRSA by PCR Next Gen: NOT DETECTED

## 2024-04-28 LAB — HEPARIN LEVEL (UNFRACTIONATED): Heparin Unfractionated: >1.1 [IU]/mL — ABNORMAL HIGH (ref 0.30–0.70)

## 2024-04-28 LAB — CBG MONITORING, ED: Glucose-Capillary: 122 mg/dL — ABNORMAL HIGH (ref 70–99)

## 2024-04-28 MED ORDER — VASHE WOUND IRRIGATION OPTIME
TOPICAL | Status: DC | PRN
  Administered 2024-04-28: 34 [oz_av]

## 2024-04-28 MED ORDER — LEVOTHYROXINE SODIUM 100 MCG/5ML IV SOLN: 75.0000 ug | Freq: Every day | INTRAVENOUS | Status: DC

## 2024-04-28 MED ORDER — TRANEXAMIC ACID-NACL 1000-0.7 MG/100ML-% IV SOLN
INTRAVENOUS | Status: AC
  Filled 2024-04-28: qty 100

## 2024-04-28 MED ORDER — HEMOSTATIC AGENTS (NO CHARGE) OPTIME
TOPICAL | Status: DC | PRN
  Administered 2024-04-28: 1 via TOPICAL

## 2024-04-28 MED ORDER — CHLORHEXIDINE GLUCONATE CLOTH 2 % EX PADS
6.0000 | MEDICATED_PAD | Freq: Every day | CUTANEOUS | Status: DC
  Administered 2024-04-28 – 2024-05-19 (×22): 6 via TOPICAL

## 2024-04-28 MED ORDER — ONDANSETRON HCL 4 MG/2ML IJ SOLN
INTRAMUSCULAR | Status: DC | PRN
  Administered 2024-04-28: 4 mg via INTRAVENOUS

## 2024-04-28 MED ORDER — TRANEXAMIC ACID-NACL 1000-0.7 MG/100ML-% IV SOLN
INTRAVENOUS | Status: DC | PRN
  Administered 2024-04-28: 1000 mg via INTRAVENOUS

## 2024-04-28 MED ORDER — SODIUM CHLORIDE 0.9 % IV SOLN: INTRAVENOUS | Status: DC

## 2024-04-28 MED ORDER — ACETAMINOPHEN 10 MG/ML IV SOLN
INTRAVENOUS | Status: AC
  Filled 2024-04-28: qty 100

## 2024-04-28 MED ORDER — INSULIN ASPART 100 UNIT/ML IJ SOLN
0.0000 [IU] | INTRAMUSCULAR | Status: DC
  Administered 2024-04-28 – 2024-04-30 (×5): 1 [IU] via SUBCUTANEOUS
  Filled 2024-04-28 (×5): qty 1

## 2024-04-28 MED ORDER — FENTANYL CITRATE (PF) 100 MCG/2ML IJ SOLN
INTRAMUSCULAR | Status: AC
  Filled 2024-04-28: qty 2

## 2024-04-28 MED ORDER — 0.9 % SODIUM CHLORIDE (POUR BTL) OPTIME
TOPICAL | Status: DC | PRN
Start: 2024-04-28 — End: 2024-04-28
  Administered 2024-04-28: 400 mL
  Administered 2024-04-28: 2000 mL

## 2024-04-28 MED ORDER — ACETAMINOPHEN 10 MG/ML IV SOLN
INTRAVENOUS | Status: DC | PRN
  Administered 2024-04-28: 1000 mg via INTRAVENOUS

## 2024-04-28 MED ORDER — ACETAMINOPHEN 10 MG/ML IV SOLN
1000.0000 mg | Freq: Four times a day (QID) | INTRAVENOUS | Status: AC
  Administered 2024-04-28 – 2024-04-29 (×4): 1000 mg via INTRAVENOUS
  Filled 2024-04-28 (×4): qty 100

## 2024-04-28 MED ORDER — SODIUM CHLORIDE 0.9% FLUSH
10.0000 mL | INTRAVENOUS | Status: DC | PRN
  Administered 2024-05-12: 10 mL
  Administered 2024-05-16: 20 mL

## 2024-04-28 MED ORDER — SUGAMMADEX SODIUM 200 MG/2ML IV SOLN
INTRAVENOUS | Status: DC | PRN
  Administered 2024-04-28: 318 mg via INTRAVENOUS

## 2024-04-28 MED ORDER — TRACE MINERALS CU-MN-SE-ZN 300-55-60-3000 MCG/ML IV SOLN
INTRAVENOUS | Status: AC
  Filled 2024-04-28: qty 384

## 2024-04-28 MED ORDER — ACETAMINOPHEN 10 MG/ML IV SOLN: 1000.0000 mg | Freq: Once | INTRAVENOUS | Status: DC | PRN

## 2024-04-28 MED ORDER — SODIUM CHLORIDE (PF) 0.9 % IJ SOLN
INTRAMUSCULAR | Status: AC
  Filled 2024-04-28: qty 20

## 2024-04-28 MED ORDER — SODIUM CHLORIDE (PF) 0.9 % IJ SOLN
INTRAMUSCULAR | Status: DC | PRN
  Administered 2024-04-28: 80 mL via INTRAMUSCULAR

## 2024-04-28 MED ORDER — LACTATED RINGERS IV SOLN: INTRAVENOUS | Status: DC | PRN

## 2024-04-28 MED ORDER — PIPERACILLIN-TAZOBACTAM 3.375 G IVPB
3.3750 g | Freq: Three times a day (TID) | INTRAVENOUS | Status: DC
  Administered 2024-04-28 – 2024-05-05 (×22): 3.375 g via INTRAVENOUS
  Filled 2024-04-28 (×23): qty 50

## 2024-04-28 MED ORDER — SODIUM CHLORIDE 0.9% FLUSH
10.0000 mL | Freq: Two times a day (BID) | INTRAVENOUS | Status: DC
  Administered 2024-04-28 – 2024-05-06 (×13): 10 mL
  Administered 2024-05-06: 20 mL
  Administered 2024-05-07 – 2024-05-11 (×6): 10 mL
  Administered 2024-05-12: 20 mL
  Administered 2024-05-12: 30 mL
  Administered 2024-05-13 – 2024-05-19 (×13): 10 mL

## 2024-04-28 MED ORDER — HYDROMORPHONE HCL 1 MG/ML IJ SOLN
0.5000 mg | INTRAMUSCULAR | Status: DC | PRN
Start: 2024-04-28 — End: 2024-05-01
  Administered 2024-04-28 – 2024-05-01 (×15): 0.5 mg via INTRAVENOUS
  Filled 2024-04-28 (×2): qty 0.5
  Filled 2024-04-28 (×3): qty 1
  Filled 2024-04-28: qty 0.5
  Filled 2024-04-28: qty 1
  Filled 2024-04-28: qty 0.5
  Filled 2024-04-28: qty 1
  Filled 2024-04-28 (×4): qty 0.5
  Filled 2024-04-28: qty 1
  Filled 2024-04-28: qty 0.5
  Filled 2024-04-28: qty 1

## 2024-04-28 MED ORDER — LACTATED RINGERS IV SOLN: INTRAVENOUS | Status: DC

## 2024-04-28 MED ORDER — HYDRALAZINE HCL 20 MG/ML IJ SOLN: 10.0000 mg | INTRAMUSCULAR | Status: DC | PRN

## 2024-04-28 MED ORDER — FENTANYL CITRATE (PF) 100 MCG/2ML IJ SOLN
25.0000 ug | INTRAMUSCULAR | Status: DC | PRN
  Administered 2024-04-28 (×3): 25 ug via INTRAVENOUS

## 2024-04-28 NOTE — Progress Notes (Signed)
 [facility] SURGICAL PROGRESS NOTE  Hospital Day(s): 1.   Post op day(s): 1 Day Post-Op.   Interval History:  Patient seen and examined the same morning of surgery  Patient sleeping; arouses appropriately Abdomen is sore No fever She did have bump in leukocytosis; WBC 23.4K; this is likely reactive Hgb to 11.2; relatively stable Renal function seems to be at baseline; sCr - 1.23; UO - 375 ccs No electrolyte derangements Surgical; drain with 60 ccs out; This is thin sanguinous appearing fluid  NGT with 225 ccs out She is on Zosyn  Vital signs in last 24 hours: [min-max] current  Temp:  [96.4 F (35.8 C)-98.5 F (36.9 C)] 98.5 F (36.9 C) (10/31 0800) Pulse Rate:  [80-98] 87 (10/31 1000) Resp:  [11-31] 11 (10/31 1000) BP: (116-241)/(61-188) 116/61 (10/31 1000) SpO2:  [97 %-100 %] 98 % (10/31 1000) Weight:  [79.5 kg-79.6 kg] 79.6 kg (10/31 0400)     Height: 5' 10 (177.8 cm) Weight: 79.6 kg BMI (Calculated): 25.18   Intake/Output last 2 shifts:  10/30 0701 - 10/31 0700 In: 2481.6 [I.V.:2071.3; IV Piggyback:410.3] Out: 960 [Urine:375; Emesis/NG output:225; Drains:60; Blood:300]   Physical Exam:  Constitutional: alert, cooperative and no distress  HEENT: NGT in place  Respiratory: breathing non-labored at rest Cardiovascular: regular rate and sinus rhythm  Gastrointestinal: Soft, incisional soreness, she does not appear overtly distended. Surgical drain in left abdomen; output thin sanguinous fluid Genitourinary; Foley in place Integumentary: Laparotomy is intact with staples, drainage inferiorly on honeycomb  Labs:     Latest Ref Rng & Units 04/28/2024    8:17 AM 04/27/2024    6:34 PM 12/23/2023    6:34 PM  CBC  WBC 4.0 - 10.5 K/uL 23.4  12.4  6.4   Hemoglobin 12.0 - 15.0 g/dL 88.7  87.3  88.3   Hematocrit 36.0 - 46.0 % 34.0  38.7  36.5   Platelets 150 - 400 K/uL 210  266  221       Latest Ref Rng & Units 04/28/2024    8:17 AM 04/27/2024    6:34  PM 12/23/2023    6:34 PM  CMP  Glucose 70 - 99 mg/dL 838  761  867   BUN 8 - 23 mg/dL 29  29  20    Creatinine 0.44 - 1.00 mg/dL 8.72  8.83  9.01   Sodium 135 - 145 mmol/L 143  138  139   Potassium 3.5 - 5.1 mmol/L 4.3  3.8  3.8   Chloride 98 - 111 mmol/L 106  100  104   CO2 22 - 32 mmol/L 22  23  24    Calcium  8.9 - 10.3 mg/dL 8.5  9.6  9.3   Total Protein 6.5 - 8.1 g/dL  8.2  7.1   Total Bilirubin 0.0 - 1.2 mg/dL  1.1  0.8   Alkaline Phos 38 - 126 U/L  85  71   AST 15 - 41 U/L  27  25   ALT 0 - 44 U/L  21  19      Imaging studies: No new pertinent imaging studies   Assessment/Plan: 68 y.o. female 1 Day Post-Op s/p exploratory laparotomy with small bowel resection, ileocecectomy, ileocolonic anastomosis and reduction of internal hernia   - Will likely remain NPO for a few days; anticipate ileus; will consult for PICC & TPN - Continue NGT decompression; LIS; monitor and record output    - Continue foley catheter for today; monitor and record UO -  Continue IV Abx (Zosyn) - Monitor abdominal examination; on-going bowel function    - Monitor leukocytosis; likely reactive   - Pain control prn; antiemetics prn - Mobilize; okay to engage therapies, likely start tomorrow (11/01) - Further management per primary service; we will follow    All of the above findings and recommendations were discussed with the patient, and the medical team, and all of patient's questions were answered to her expressed satisfaction.  -- Arthea Platt, PA-C [facility] 04/28/2024, 10:32 AM M-F: 7am - 4pm

## 2024-04-28 NOTE — Progress Notes (Signed)
 PROGRESS NOTE    Kylie [name]  FMW:[id number] DOB: August 19, 1955 DOA: 04/27/2024 PCP: [facility]  Chief Complaint  Patient presents with   Hypertension   Abdominal Pain    Hospital Course:  Kylie [name] is a 68 y.o. female with medical history significant of dementia, HTN, HLD, sCHF with EF 40-45%, CVA, CKD-3a, depression, DM, PE on Xarelto , s/p right nephrectomy in 2009 for right renal cell carcinoma presented with abdominal pain.  Poor historian due to dementia.  Workup revealed leukocytosis, lactic acidosis, imaging shows ischemic bowel disease, s/p exploratory laparotomy and small bowel resection with cecectomy on 10/30. Hospital course as below  Subjective: Patient was examined at bedside, new to me today. States she has some discomfort in her throat, otherwise denies any other complaints Called [name] Cousin (POA), provided updates and answered all questions   Objective: Vitals:   04/28/24 0700 04/28/24 0753 04/28/24 0800 04/28/24 0900  BP: (!) 144/94  116/63   Pulse: 90 98 95 87  Resp: (!) 23 (!) 26 16 11   Temp:   98.5 F (36.9 C)   TempSrc:   Oral   SpO2: 98% 100% 100% 98%  Weight:      Height:        Intake/Output Summary (Last 24 hours) at 04/28/2024 1001 Last data filed at 04/28/2024 0900 Gross per 24 hour  Intake 2698.86 ml  Output 960 ml  Net 1738.86 ml   Filed Weights   04/27/24 1826 04/28/24 0400  Weight: 79.5 kg 79.6 kg    Examination: General: alert, no acute distress Cardiac: S1/S2, RRR, No murmurs, No gallops or rubs Respiratory: No rales, wheezing, rhonchi or rubs GI: mild tenderness consistent post op Ext: No pitting leg edema bilaterally. 1+DP/PT pulse bilaterally. Musculoskeletal: No joint deformities, No joint redness or warmth, no limitation of ROM in spin. Skin: No rashes.  Neuro: awake, alert, baseline confused, no gross focal deficits  Assessment & Plan:  Ischemic bowel disease and closed loop obstruction of intestine  (HCC): - S/p exploratory laparotomy, reduction of internal hernia, small bowel resection with cecectomy (110 cm), ileocolic anastomosis -on 10/30 - NPO, NG tube to suction, IV fluids - Continue IV Zosyn, leukocytosis 23.4 likely reactive - Will place PICC line, TPN. Likely to have postop ileus - Surgery following, appreciate recs - Pain management, antiemetics prn  Hypertensive urgency - resolved HTN - On Lasix , Cozaar , clonidine  patch, amlodipine  at home - Hold all po meds - IV hydralazine  prn with holding parameters  Chronic kidney disease, stage 3a Renal function stable.  Recent baseline creatinine 1.0-1.3 - Follow-up with BMP   History of pulmonary embolism - Hold Eliquis - Received 4000 units of Kcentra for reversal on 10/30   Chronic combined systolic and diastolic CHF (congestive heart failure) (HCC): 2D echo on 10/08/2023 showed EF of 40-45% with grade 2 diastolic dysfunction.  Patient does not have leg edema or JVD.  CHF seems to be concentrated. - Hold Lasix , BNP not elevated - NPO, gentle IV fluids   Acquired hypothyroidism - On Levothyroxine  100mcg, start on IV Levothyroxine  75mcg   CVA (cerebral vascular accident) (HCC) - Hold Crestor , Eliquis   CAD (coronary artery disease): Troponin negative x 2 -Hold Crestor  now   HLD (hyperlipidemia) -Hold Crestor    Depression -Hold Cymbalta  and olanzapine  due to QTc plantation  -- PT/OT eval (likely tomorrow) -- Has foleys catheter  DVT prophylaxis: SCD's   Code Status: Full Code Disposition:  TBD  Consultants:  Treatment Team:  Consulting Physician:  Desiderio Schanz, MD  Procedures:  exploratory laparotomy, reduction of internal hernia, small bowel resection with cecectomy (110 cm), ileocolic anastomosis -on 10/30  Antimicrobials:  Anti-infectives (From admission, onward)    Start     Dose/Rate Route Frequency Ordered Stop   04/28/24 0600  piperacillin-tazobactam (ZOSYN) IVPB 3.375 g        3.375 g 12.5  mL/hr over 240 Minutes Intravenous Every 8 hours 04/28/24 0256     04/27/24 2300  cefoTEtan (CEFOTAN) 2 g in sodium chloride  0.9 % 100 mL IVPB        2 g 200 mL/hr over 30 Minutes Intravenous On call to O.R. 04/27/24 2255 04/28/24 0015       Data Reviewed: I have personally reviewed following labs and imaging studies CBC: Recent Labs  Lab 04/27/24 1834 04/28/24 0817  WBC 12.4* 23.4*  NEUTROABS 10.5*  --   HGB 12.6 11.2*  HCT 38.7 34.0*  MCV 95.8 96.3  PLT 266 210   Basic Metabolic Panel: Recent Labs  Lab 04/27/24 1834 04/28/24 0817  NA 138 143  K 3.8 4.3  CL 100 106  CO2 23 22  GLUCOSE 238* 161*  BUN 29* 29*  CREATININE 1.16* 1.27*  CALCIUM  9.6 8.5*  MG 1.9  --    GFR: Estimated Creatinine Clearance: 44.6 mL/min (A) (by C-G formula based on SCr of 1.27 mg/dL (H)). Liver Function Tests: Recent Labs  Lab 04/27/24 1834  AST 27  ALT 21  ALKPHOS 85  BILITOT 1.1  PROT 8.2*  ALBUMIN 4.0   CBG: Recent Labs  Lab 04/28/24 0258 04/28/24 0358 04/28/24 0730 04/28/24 0919  GLUCAP 122* 117* 136* 145*    Recent Results (from the past 240 hours)  MRSA Next Gen by PCR, Nasal     Status: None   Collection Time: 04/28/24  4:14 AM   Specimen: Nasal Mucosa; Nasal Swab  Result Value Ref Range Status   MRSA by PCR Next Gen NOT DETECTED NOT DETECTED Final    Comment: (NOTE) The GeneXpert MRSA Assay (FDA approved for NASAL specimens only), is one component of a comprehensive MRSA colonization surveillance program. It is not intended to diagnose MRSA infection nor to guide or monitor treatment for MRSA infections. Test performance is not FDA approved in patients less than 39 years old. Performed at [facility], [address] [place], [place] [address]      Radiology Studies: CT Angio Chest/Abd/Pel for Dissection W and/or Wo Contrast Result Date: 04/27/2024 CLINICAL DATA:  Chest pressure and hypertension. Acute aortic syndrome suspected. EXAM: CT  ANGIOGRAPHY CHEST, ABDOMEN AND PELVIS TECHNIQUE: Non-contrast CT of the chest was initially obtained. Multidetector CT imaging through the chest, abdomen and pelvis was performed using the standard protocol during bolus administration of intravenous contrast. Multiplanar reconstructed images and MIPs were obtained and reviewed to evaluate the vascular anatomy. RADIATION DOSE REDUCTION: This exam was performed according to the departmental dose-optimization program which includes automated exposure control, adjustment of the mA and/or kV according to patient size and/or use of iterative reconstruction technique. CONTRAST:  75mL OMNIPAQUE  IOHEXOL  350 MG/ML SOLN COMPARISON:  Chest CT dated 10/07/2023. FINDINGS: Evaluation is limited due to streak artifact caused by patient's arms. CTA CHEST FINDINGS Cardiovascular: There is no cardiomegaly or pericardial effusion. Three-vessel coronary vascular calcification and calcification of the mitral annulus. Moderate atherosclerotic calcification of the thoracic aorta. No aneurysmal dilatation or dissection. The origins of the great vessels of the aortic arch and the central pulmonary arteries appear  patent. Mediastinum/Nodes: No hilar or mediastinal adenopathy. The esophagus is grossly unremarkable. No mediastinal fluid collection. Lungs/Pleura: No focal consolidation, pleural effusion, pneumothorax. The central airways are patent. Musculoskeletal: Degenerative changes of the spine. No acute osseous pathology. Review of the MIP images confirms the above findings. CTA ABDOMEN AND PELVIS FINDINGS VASCULAR Aorta: Moderate atherosclerotic calcification of the abdominal aorta. No aneurysmal dilatation or dissection. No periaortic fluid collection. Celiac: The celiac trunk and its major branches are patent. SMA: The SMA is patent. Renals: Atherosclerotic calcification of the origin of the left renal artery. The left renal artery is patent. Status post right nephrectomy. IMA: The  origin of the IMA is patent. Inflow: Moderate atherosclerotic calcification of the iliac arteries. 80 and arteries are patent. No aneurysmal dilatation or dissection. Veins: An infrarenal IVC filter is noted.  No portal venous gas. Review of the MIP images confirms the above findings. NON-VASCULAR No intra-abdominal free air.  Small free fluid. Hepatobiliary: The liver is unremarkable. There is mild biliary dilatation, post cholecystectomy. Pancreas: The pancreas is grossly unremarkable as visualized. Spleen: Normal in size without focal abnormality. Adrenals/Urinary Tract: Status post right nephrectomy. No hydronephrosis on the left. The urinary bladder is grossly unremarkable. Stomach/Bowel: There is inflammatory changes and thickening of a cluster of small bowel loop in the mid abdomen. There is apparent focal area of mesenteric defect (200/5 and coronal 41/8) with herniation of inflamed bowel loops. The bowel loops measure up to 3.7 cm in diameter. Findings most consistent with a closed loop obstruction likely secondary to an internal hernia. Surgical consult is advised. Several small scattered pockets of air along the wall of the dilated bowel noted which may be intraluminal but concerning for developing bowel ischemia. There is mild colonic diverticulosis. The partially visualized linear structure in the right lower quadrant may represent a normal appendix (43/8). Lymphatic: No adenopathy. Reproductive: The uterus is grossly unremarkable. No suspicious adnexal masses. Other: None Musculoskeletal: Degenerative changes of the spine. No acute osseous pathology. Review of the MIP images confirms the above findings. IMPRESSION: 1. No aortic aneurysm or dissection. 2. Closed loop small bowel obstruction likely secondary to an internal hernia with findings concerning for developing bowel ischemia. Clinical correlation and surgical consult is advised. 3. Status post right nephrectomy. 4.  Aortic Atherosclerosis  (ICD10-I70.0). Electronically Signed   By: Vanetta Chou M.D.   On: 04/27/2024 20:37   CT HEAD WO CONTRAST ( ) Result Date: 04/27/2024 EXAM: CT HEAD WITHOUT CONTRAST 04/27/2024 08:16:19 PM TECHNIQUE: CT of the head was performed without the administration of intravenous contrast. Automated exposure control, iterative reconstruction, and/or weight based adjustment of the mA/kV was utilized to reduce the radiation dose to as low as reasonably achievable. COMPARISON: Head CT 23:25. CLINICAL HISTORY: Headache, fever. FINDINGS: BRAIN AND VENTRICLES: No acute hemorrhage. No evidence of acute infarct. No hydrocephalus. No extra-axial collection. No mass effect or midline shift. There is atrophy and chronic small vessel ischemic changes of the white matter. Carotid vascular calcification. ORBITS: No acute abnormality. SINUSES: Mucosal thickening in the left sphenoid sinus. SOFT TISSUES AND SKULL: No acute soft tissue abnormality. No skull fracture. IMPRESSION: 1. No acute intracranial abnormality. 2. Cerebral atrophy and chronic small vessel ischemic changes of the white matter. Electronically signed by: Luke Bun MD 04/27/2024 08:32 PM EDT RP Workstation: [id number]    Scheduled Meds:  Chlorhexidine  Gluconate Cloth  6 each Topical Daily   insulin  aspart  0-5 Units Subcutaneous QHS   insulin  aspart  0-6 Units Subcutaneous TID WC  Continuous Infusions:  sodium chloride  75 mL/hr at 04/28/24 0900   acetaminophen  Stopped (04/28/24 0612)   piperacillin-tazobactam (ZOSYN)  IV 12.5 mL/hr at 04/28/24 0900     LOS: 1 day  MDM: Patient is high risk for one or more organ failure.  They necessitate ongoing hospitalization for continued IV therapies and subsequent lab monitoring. Total time spent interpreting labs and vitals, reviewing the medical record, coordinating care amongst consultants and care team members, directly assessing and discussing care with the patient and/or family: 55 min Laree Lock,  MD [facility]  To contact the attending physician between 7A-7P please use Epic Chat. To contact the covering physician during after hours 7P-7A, please review Amion.  04/28/2024, 10:01 AM   *This document has been created with the assistance of dictation software. Please excuse typographical errors. *

## 2024-04-28 NOTE — TOC Initial Note (Signed)
 Transition of Care [facility]) - Initial/Assessment Note    Patient Details  Name: Kylie [name] MRN: [id number] Date of Birth: Mar 16, 1956  [facility]) CM/SW Contact:    Delphine KANDICE Bring, RN Phone Number: 04/28/2024, 12:56 PM  Clinical Narrative:                 Patient has PMH dementia, HTN, HLD, sCHF with EF 40-45%, CVA, CKD-3a, depression, DM, PE on Xarelto , s/p right nephrectomy in 2009 for right renal cell carcinoma, who presents with abdominal pain.  Currently patient has no family members at bedside. She has oxygen and NGT .   CM spoke with Barnie at [facility]. She states that patient is a PACE patient but is long term care at the facility. She cam return when medically ready.        Patient Goals and CMS Choice            Expected Discharge Plan and Services                                              Prior Living Arrangements/Services                       Activities of Daily Living   ADL Screening (condition at time of admission) Independently performs ADLs?: No Is the patient deaf or have difficulty hearing?: No Does the patient have difficulty seeing, even when wearing glasses/contacts?: No Does the patient have difficulty concentrating, remembering, or making decisions?: No  Permission Sought/Granted                  Emotional Assessment              Admission diagnosis:  Ischemic bowel disease [K55.9] Uncontrolled hypertension [I10] Complete intestinal obstruction, unspecified cause (HCC) [K56.601] Patient Active Problem List   Diagnosis Date Noted   Ischemic bowel disease 04/27/2024   Closed loop obstruction of intestine (HCC) 04/27/2024   Chronic combined systolic and diastolic CHF (congestive heart failure) (HCC) 04/27/2024   Leukocytosis 04/27/2024   HLD (hyperlipidemia) 04/27/2024   CAD (coronary artery disease) 04/27/2024   Depression 04/27/2024   Chronic kidney disease, stage 3a (HCC)  04/27/2024   Hypertensive urgency 04/27/2024   QT prolongation 04/27/2024   Hypokalemia 10/07/2023   Sepsis, severe, due to pneumonia (HCC) 10/06/2023   RSV (respiratory syncytial virus pneumonia) 10/06/2023   Acute respiratory failure with hypoxia (HCC) 10/06/2023   Rhabdomyolysis, traumatic 08/03/2023   Hypoglycemia 08/03/2023   S/p R nephrectomy for RCC 08/03/2023   History of pulmonary embolism 08/03/2023   History of CVA (cerebrovascular accident) 08/03/2023   Dementia (HCC)    Chronic anticoagulation due to history of pulmonary embolism 05/25/2017   CVA (cerebral vascular accident) (HCC) 05/25/2017   Hyperlipidemia 05/25/2017   Obesity (BMI 30.0-34.9) 10/22/2016   Type 2 diabetes mellitus without complication, without long-term current use of insulin  (HCC) 11/14/2014   Acquired hypothyroidism 02/19/2014   Allergic rhinitis 02/19/2014   Neuropathy 02/19/2014   Primary osteoarthritis of both knees 02/19/2014   Severe episode of recurrent major depressive disorder, without psychotic features (HCC) 02/19/2014   Vitamin B 12 deficiency 02/19/2014   Vitamin D  deficiency 02/19/2014   PE (pulmonary thromboembolism) (HCC) 10/03/2012   Malignant neoplasm of kidney (HCC) 03/21/2012   Gallstones without obstruction of gallbladder 05/08/2009  Sleep apnea 07/05/2000   Essential hypertension 12/06/1996   PCP:  [facility] Pharmacy:   [facility] [address] [place] - [address] [place] [address] Phone: [phone] Fax: [phone]  [id number] [place], [place] - [address] AT [facility] OF [address] [place] [address] Phone: [phone] Fax: [phone]     Social Drivers of Health (SDOH) Social History: SDOH Screenings   Food Insecurity: Patient Unable To Answer (04/28/2024)  Housing: Unknown (04/28/2024)  Transportation Needs: Patient Unable To Answer (04/28/2024)   Utilities: Patient Unable To Answer (04/28/2024)  Financial Resource Strain: Low Risk  (09/05/2020)   Received from [facility]  Physical Activity: Inactive (03/04/2021)   Received from [facility]  Social Connections: Patient Unable To Answer (04/28/2024)  Tobacco Use: Low Risk  (01/05/2024)   Received from [facility]  Health Literacy: High Risk (01/21/2021)   Received from [facility]   SDOH Interventions:     Readmission Risk Interventions     No data to display

## 2024-04-28 NOTE — Plan of Care (Signed)

## 2024-04-28 NOTE — Op Note (Addendum)
 Procedure Date:  04/28/2024  Pre-operative Diagnosis:  Internal hernia resulting in small bowel ischemia  Post-operative Diagnosis:  Internal hernia resulting in small bowel ischemia and necrosis.  Procedure:  Exploratory Laparotomy, reduction of internal hernia, small bowel resection with cecectomy (length 110 cm), ileocolic anastomosis.  Surgeon:  Aloysius [name], MD  Anesthesia:  General endotracheal  Estimated Blood Loss:  300 ml  Specimens:   Small bowel, 110 cm. Cecum with appendix  Complications:  None  Indications for Procedure:  This is a 68 y.o. female who presents with abdominal pain and peritonitis and workup revealing pneumoperitoneum.  The risks of bleeding, abscess or infection, injury to surrounding structures, and need for further procedures were all discussed with the patient and was willing to proceed.  Description of Procedure: The patient was correctly identified in the preoperative area and brought into the operating room.  The patient was placed supine with VTE prophylaxis in place.  Appropriate time-outs were performed.  Anesthesia was induced and the patient was intubated.  Foley catheter was placed.  Appropriate antibiotics were infused.  The abdomen was prepped and draped in a sterile fashion.  A midline incision was made and electrocautery was used to dissect down the subcutaneous tissue to the fascia.  The fascia was incised and extended superiorly and inferiorly.  There was evidence of mesh from prior supraumbilical hernia repair.  Upon entering the abdomen (organ space), I encountered significant ischemic and necrotic changes of the small bowel.  The omentum was adhered to the anterior abdominal wall.  Once this was freed up using LigaSure, I found the small bowel which had ischemic and necrotic changes, with diffuse hematoma of its involved mesentery.  It was unclear how the internal hernia developed, but I had to cut through part of the mesentery involved  to be able to reduce the hernia.  This revealed a 110 cm segment of distal small bowel all the way to the ileocecal valve.  This segment was resected using blue load 75 mm GIA staplers.  The mesentery was taken down using LigaSure.  Then, the ascending colon was mobilized by freeing along the [employer] of [place] going distally.  This allowed the cecum to be elevated.  The proximal 5 cm of cecum were resected including the appendix, also using blue load 75 mm stapler.  After this, the small bowel end and ascending colon were lined up in isoperistaltic fashion and an anastomosis created using a 45 mm blue load stapler.  The common channel was then closed using 3-0 Stratafix suture and the line imbricated as well.  The staple line from the cecum was imbricated as well using 3-0 Silk sutures.  The mesenteric defect was then closed using 3-0 Silk suture as well.  After this, the abdomen was thoroughly irrigated with warm saline and Vashe solution.  Exparel solution mixed with 0.5% bupivacaine with epi was infiltrated over the peritoneum, fascia, and subcutaneous tissue.  A 19 Fr Blake drain was placed in pelvis and RLQ.  Surgicell powder was sprayed over the field to help with hemostasis.  The fascia was then closed using #1 PDS sutures.  The midline wound was irrigated and closed over a 1/4 inch penrose drain using 3-0 Vicryl and skin staples.  The drain was secured using 2-0 nylon suture.  The wound was dressed with Honeycomb dressing and the drain with 4x4 gauze and tegaderm.   The patient was emerged from anesthesia and extubated and brought to the recovery room for  further management.  The patient tolerated the procedure well and all counts were correct at the end of the case.   Aloysius [name], MD

## 2024-04-28 NOTE — Brief Op Note (Signed)
 04/27/2024 - 04/28/2024  2:37 AM  PATIENT:  Kylie [name]  68 y.o. female  PRE-OPERATIVE DIAGNOSIS:  ischemic bowel  POST-OPERATIVE DIAGNOSIS:  ischemic bowel  PROCEDURE:  Procedure(s): LAPAROTOMY, EXPLORATORY (N/A) EXCISION, SMALL INTESTINE (N/A)  SURGEON:  Surgeons and Role:    * Fedor Kazmierski, MD - Primary  ANESTHESIA:   general  EBL:  300 mL   BLOOD ADMINISTERED:none  DRAINS: Penrose drain in the midline incision and (19 Fr) Blake drain(s) in the pelvis   LOCAL MEDICATIONS USED:  BUPIVICAINE   SPECIMEN:  Source of Specimen:  small bowel; cecectomy  DISPOSITION OF SPECIMEN:  PATHOLOGY  COUNTS:  YES  DICTATION: .Dragon Dictation  PLAN OF CARE: Admit to inpatient   PATIENT DISPOSITION:  PACU - hemodynamically stable.   Delay start of Pharmacological VTE agent (>24hrs) due to surgical blood loss or risk of bleeding: yes

## 2024-04-28 NOTE — Progress Notes (Signed)
 Initial Nutrition Assessment  DOCUMENTATION CODES:   Non-severe (moderate) malnutrition in context of chronic illness  INTERVENTION:   TPN per pharmacy   Recommend thiamine 100mg  IV x 5-7 days   Pt at high refeed risk; recommend monitor potassium, magnesium  and phosphorus labs daily until stable  Daily weights   Check B12 level  NUTRITION DIAGNOSIS:   Moderate Malnutrition related to chronic illness as evidenced by moderate fat depletion, moderate muscle depletion.  GOAL:   Patient will meet greater than or equal to 90% of their needs  MONITOR:   Diet advancement, Labs, Weight trends, Skin, I & O's, Other (Comment) (TPN)  REASON FOR ASSESSMENT:   Consult New TPN/TNA  ASSESSMENT:   68 y.o. female with h/o dementia, HTN, HLD, CHF, CVA, CKD-3a, depression, DM, PE on Xarelto , RCC s/p right nephrectomy (2009), hypothryoidism, CAD and OSA who is admitted with SBO secondary to Internal hernia resulting in small bowel ischemia and necrosis now s/p exploratory laparotomy, reduction of internal hernia, small bowel resection with cecectomy (~110cm ileum along with IC valve) and ileocolic anastomosis 10/31.  Met with pt in room today. Pt is unable to provide any detailed history r/t dementia. Pt is able to answer some basic questions. Pt reports that she is feeling a little better today. Pt reports some pain today around her incision. NGT in place with output. No BM yet. Pt remains NPO. Plan today is for PICC line and TPN initiation. Pt is at high refeed risk. Per chart, pt is down 13lbs(7%) since April; RD unsure how recently weight loss occurred.   Pt with removal of ~110cm of her terminal ileum and IC valve. Pt will be at increased risk for malabsorption of the fat soluble vitamins, magnesium  and vitamin B12. RD will check a baseline B12 level. Pt will also be at increased risk for bile salt malabsorption/diarrhea and SIBO.   Medications reviewed and include: insulin , LRS  @75ml /hr, zosyn   Labs reviewed: K 4.3 wnl, BUN 29(H), creat 1.27(H) Mg 1.9 wnl- 10/30 Wbc- 23.4(H) Cbgs- 135, 145, 136, 117, 122 x 24 hrs  AIC 5.4- 2/4  UOP-   Drains- 60ml   NUTRITION - FOCUSED PHYSICAL EXAM:  Flowsheet Row Most Recent Value  Orbital Region Mild depletion  Upper Arm Region Moderate depletion  Thoracic and Lumbar Region Moderate depletion  Buccal Region No depletion  Temple Region Mild depletion  Clavicle Bone Region Moderate depletion  Clavicle and Acromion Bone Region Moderate depletion  Scapular Bone Region Mild depletion  Dorsal Hand Mild depletion  Patellar Region Mild depletion  Anterior Thigh Region Mild depletion  Posterior Calf Region Moderate depletion  Edema (RD Assessment) None  Hair Reviewed  Eyes Reviewed  Mouth Reviewed  Skin Reviewed  Nails Reviewed   Diet Order:   Diet Order             Diet NPO time specified Except for: Sips with Meds  Diet effective now                  EDUCATION NEEDS:   Not appropriate for education at this time  Skin:  Skin Assessment: Reviewed RN Assessment (incision abodomen)  Last BM:  pta  Height:   Ht Readings from Last 1 Encounters:  04/28/24 5' 10 (1.778 m)    Weight:   Wt Readings from Last 1 Encounters:  04/28/24 79.6 kg    Ideal Body Weight:  68.1 kg  BMI:  Body mass index is 25.18 kg/m.  Estimated Nutritional Needs:   Kcal:  2000-2300kcal/day  Protein:  100-115g/day  Fluid:  1.8-2.1L/day  Augustin Shams MS, RD, LDN If unable to be reached, please send secure chat to RD inpatient available from 8:00a-4:00p daily

## 2024-04-28 NOTE — Anesthesia Postprocedure Evaluation (Signed)
 [id number] Anesthesia Post Note  Patient: Kylie [name]  Procedure(s) Performed: LAPAROTOMY, EXPLORATORY, CECECTOMY (Abdomen) EXCISION, SMALL INTESTINE (Abdomen)  Patient location during evaluation: PACU Anesthesia Type: General Level of consciousness: lethargic Pain management: pain level controlled Vital Signs Assessment: post-procedure vital signs reviewed and stable Respiratory status: spontaneous breathing, nonlabored ventilation and respiratory function stable Cardiovascular status: blood pressure returned to baseline and stable Postop Assessment: no apparent nausea or vomiting Anesthetic complications: no   No notable events documented.   Last Vitals:  Vitals:   04/28/24 0315 04/28/24 0330  BP: (!) 144/85 (!) 155/81  Pulse: 85 85  Resp: 12 16  Temp:  36.4 C  SpO2: 98% 97%    Last Pain:  Vitals:   04/28/24 0417  TempSrc:   PainSc: 10-Worst pain ever                 Camellia Merilee Louder

## 2024-04-28 NOTE — Progress Notes (Signed)
 PHARMACY - TOTAL PARENTERAL NUTRITION CONSULT NOTE   Indication: Small bowel obstruction  Patient Measurements: Height: 5' 10 (177.8 cm) Weight: 79.6 kg (175 lb 7.8 oz) IBW/kg (Calculated) : 68.5 TPN AdjBW (KG): 79.6 Body mass index is 25.18 kg/m. Usual Weight: 75.8 kg (02/07/24)  Assessment:  Kylie [name] is a 68yoF that presented with upper abdominal pain. Patient's past medical history notable for dementia, HTN, HLD, sCHF with EF 40-45% (10/08/23), CVA, CKD-3a, depression, DM, PE on Xarelto , and s/p right nephrectomy in 2009 for right renal cell carcinoma.  Glucose / Insulin :  Glucose wnl this AM Novolog  0-5 units [place] at bedtime Novolog  0-6 units [place] TID CBG monitoring q4h Electrolytes:  Na 143, Cl 106, K 4.3, Ca 8.5 From 10/30: Mag 1.9 Renal:  Scr 1.27 (BL ~0.9-1) CrCl 44.7 ml/min Hepatic: 10/30 labs  Alk phos 85, AST 27, ALT 21, total bili 1.1, albumin 4.0, INR 1.1 Intake / Output; MIVF:  UOP ~0.4 ml/kg/h LR 75 ml/h +1.5L (10/30 1900 to 10/31 0700) GI Imaging: 10/30 CT Angio Impression: Closed loop small bowel obstruction likely secondary to an internal hernia with findings concerning for developing bowel ischemia. Clinical correlation and surgical consult is advised. Status post right nephrectomy GI Surgeries / Procedures:  10/31: Exploratory Laparotomy, reduction of internal hernia, small bowel resection with cecectomy (length 110 cm), ileocolic anastomosis   Central access: no TPN start date: 04/28/2024  Nutritional Goals: Goal TPN rate is 80 mL/hr (provides 115 g of protein and 2300 kcals per day)  RD Assessment: Estimated Needs Total Energy Estimated Needs: 2000-2300kcal/day Total Protein Estimated Needs: 100-115g/day Total Fluid Estimated Needs: 1.8-2.1L/day  Current Nutrition:  NPO sips w/ meds  Plan:  ---Start TPN at 40mL/hr at 1800 ---Electrolytes in TPN (standard): Na 50mEq/L, K 45mEq/L, Ca 4mEq/L, Mg 83mEq/L, and Phos 15mmol/L. Cl:Ac 1:1 ---Add  standard MVI and trace elements to TPN ---continue Sensitive q4h SSI and adjust as needed  --Monitor TPN labs on Mon/Thurs, dadily until stable  [employer] 04/28/2024,12:11 PM

## 2024-04-28 NOTE — Progress Notes (Signed)
 Peripherally Inserted Central Catheter Placement  The IV Nurse has discussed with the patient and/or persons authorized to consent for the patient, the purpose of this procedure and the potential benefits and risks involved with this procedure.  The benefits include less needle sticks, lab draws from the catheter, and the patient may be discharged home with the catheter. Risks include, but not limited to, infection, bleeding, blood clot (thrombus formation), and puncture of an artery; nerve damage and irregular heartbeat and possibility to perform a PICC exchange if needed/ordered by physician.  Alternatives to this procedure were also discussed.  Bard Power PICC patient education guide, fact sheet on infection prevention and patient information card has been provided to patient /or left at bedside. Telephone consent obtained by  [name] Carder in pt chart.  PICC Placement Documentation  PICC Double Lumen 04/28/24 Right Basilic 37 cm 1 cm (Active)  Indication for Insertion or Continuance of Line Administration of hyperosmolar/irritating solutions (i.e. TPN, Vancomycin, etc.) 04/28/24 1800  Exposed Catheter (cm) 1 cm 04/28/24 1800  Site Assessment Clean, Dry, Intact 04/28/24 1800  Lumen #1 Status Flushed;Saline locked;Blood return noted 04/28/24 1800  Lumen #2 Status Flushed;Saline locked;Blood return noted 04/28/24 1800  Dressing Type Transparent;Securing device 04/28/24 1800  Dressing Status Antimicrobial disc/dressing in place 04/28/24 1800  Line Care Connections checked and tightened 04/28/24 1800  Line Adjustment (NICU/IV Team Only) No 04/28/24 1800  Dressing Intervention New dressing;Adhesive placed at insertion site (IV team only) 04/28/24 1800  Dressing Change Due 05/05/24 04/28/24 1800       Kylie [name]  [name] 04/28/2024, 6:59 PM

## 2024-04-28 NOTE — Transfer of Care (Signed)
 Immediate Anesthesia Transfer of Care Note  Patient: Kylie [name]  Procedure(s) Performed: Procedure(s): LAPAROTOMY, EXPLORATORY (N/A) EXCISION, SMALL INTESTINE (N/A)  Patient Location: PACU  Anesthesia Type:General  Level of Consciousness: sedated  Airway & Oxygen Therapy: Patient Spontanous Breathing and Patient connected to face mask oxygen  Post-op Assessment: Report given to RN and Post -op Vital signs reviewed and stable  Post vital signs: Reviewed and stable  Last Vitals:  Vitals:   04/28/24 0248 04/28/24 0255  BP: (!) 158/86   Pulse: 88   Resp: 11   Temp:  36.4 C  SpO2: 100%     Complications: No apparent anesthesia complications

## 2024-04-28 NOTE — Anesthesia Procedure Notes (Addendum)
 Procedure Name: Intubation Date/Time: 04/27/2024 11:33 PM  Performed by: Tod Handing, CRNAPre-anesthesia Checklist: Patient identified, Emergency Drugs available, Suction available and Patient being monitored Patient Re-evaluated:Patient Re-evaluated prior to induction Oxygen Delivery Method: Circle system utilized Preoxygenation: Pre-oxygenation with 100% oxygen Induction Type: IV induction, Cricoid Pressure applied and Rapid sequence Laryngoscope Size: Mac, 3 and McGrath Grade View: Grade II Tube type: Oral Tube size: 6.5 mm Number of attempts: 1 Airway Equipment and Method: Stylet Placement Confirmation: ETT inserted through vocal cords under direct vision, positive ETCO2 and breath sounds checked- equal and bilateral Secured at: 21 cm Tube secured with: Tape Dental Injury: Teeth and Oropharynx as per pre-operative assessment

## 2024-04-29 ENCOUNTER — Inpatient Hospital Stay

## 2024-04-29 DIAGNOSIS — K559 Vascular disorder of intestine, unspecified: Secondary | ICD-10-CM | POA: Diagnosis not present

## 2024-04-29 DIAGNOSIS — E440 Moderate protein-calorie malnutrition: Secondary | ICD-10-CM | POA: Diagnosis present

## 2024-04-29 LAB — VITAMIN B12: Vitamin B-12: 211 pg/mL (ref 180–914)

## 2024-04-29 LAB — BASIC METABOLIC PANEL WITH GFR
Anion gap: 13 (ref 5–15)
BUN: 52 mg/dL — ABNORMAL HIGH (ref 8–23)
CO2: 23 mmol/L (ref 22–32)
Calcium: 8.3 mg/dL — ABNORMAL LOW (ref 8.9–10.3)
Chloride: 104 mmol/L (ref 98–111)
Creatinine, Ser: 2.36 mg/dL — ABNORMAL HIGH (ref 0.44–1.00)
GFR, Estimated: 22 mL/min — ABNORMAL LOW (ref >60)
Glucose, Bld: 152 mg/dL — ABNORMAL HIGH (ref 70–99)
Potassium: 4.4 mmol/L (ref 3.5–5.1)
Sodium: 140 mmol/L (ref 135–145)

## 2024-04-29 LAB — CBC
HCT: 25.5 % — ABNORMAL LOW (ref 36.0–46.0)
Hemoglobin: 8.3 g/dL — ABNORMAL LOW (ref 12.0–15.0)
MCH: 32 pg (ref 26.0–34.0)
MCHC: 32.5 g/dL (ref 30.0–36.0)
MCV: 98.5 fL (ref 80.0–100.0)
Platelets: 183 K/uL (ref 150–400)
RBC: 2.59 MIL/uL — ABNORMAL LOW (ref 3.87–5.11)
RDW: 13.7 % (ref 11.5–15.5)
WBC: 22.2 K/uL — ABNORMAL HIGH (ref 4.0–10.5)
nRBC: 0 % (ref 0.0–0.2)

## 2024-04-29 LAB — GLUCOSE, CAPILLARY
Glucose-Capillary: 154 mg/dL — ABNORMAL HIGH (ref 70–99)
Glucose-Capillary: 145 mg/dL — ABNORMAL HIGH (ref 70–99)
Glucose-Capillary: 127 mg/dL — ABNORMAL HIGH (ref 70–99)
Glucose-Capillary: 125 mg/dL — ABNORMAL HIGH (ref 70–99)
Glucose-Capillary: 136 mg/dL — ABNORMAL HIGH (ref 70–99)
Glucose-Capillary: 147 mg/dL — ABNORMAL HIGH (ref 70–99)
Glucose-Capillary: 146 mg/dL — ABNORMAL HIGH (ref 70–99)

## 2024-04-29 LAB — PHOSPHORUS: Phosphorus: 4.9 mg/dL — ABNORMAL HIGH (ref 2.5–4.6)

## 2024-04-29 LAB — MAGNESIUM: Magnesium: 1.9 mg/dL (ref 1.7–2.4)

## 2024-04-29 MED ORDER — CYANOCOBALAMIN 1000 MCG/ML IJ SOLN
1000.0000 ug | Freq: Once | INTRAMUSCULAR | Status: AC
  Administered 2024-04-29: 1000 ug via INTRAMUSCULAR
  Filled 2024-04-29: qty 1

## 2024-04-29 MED ORDER — TRACE MINERALS CU-MN-SE-ZN 300-55-60-3000 MCG/ML IV SOLN
INTRAVENOUS | Status: AC
  Filled 2024-04-29: qty 720

## 2024-04-29 MED ORDER — OLANZAPINE 5 MG PO TBDP
2.5000 mg | ORAL_TABLET | Freq: Four times a day (QID) | ORAL | Status: DC | PRN
  Administered 2024-05-12: 2.5 mg via ORAL
  Filled 2024-04-29 (×2): qty 0.5

## 2024-04-29 NOTE — Progress Notes (Signed)
 PHARMACY - TOTAL PARENTERAL NUTRITION CONSULT NOTE   Indication: Small bowel obstruction  Patient Measurements: Height: 5' 10 (177.8 cm) Weight: 78.1 kg (172 lb 2.9 oz) IBW/kg (Calculated) : 68.5 TPN AdjBW (KG): 79.6 Body mass index is 24.71 kg/m. Usual Weight: 75.8 kg (02/07/24)  Assessment:  Kylie [name] is a 68yoF that presented with upper abdominal pain. Patient's past medical history notable for dementia, HTN, HLD, sCHF with EF 40-45% (10/08/23), CVA, CKD-3a, depression, DM, PE on Xarelto , and s/p right nephrectomy in 2009 for right renal cell carcinoma.  Glucose / Insulin :  BG 145-152. Novolog  in the last 24 hours : 4 units.  Electrolytes:  Phos 4.9.  Renal:  Scr 1.27 > 2.36 (BL ~0.9-1) Hepatic: 10/30 labs  Alk phos 85, AST 27, ALT 21, total bili 1.1, albumin 4.0, INR 1.1 Intake / Output; MIVF:  UOP ~0.4 ml/kg/h LR 75 ml/h +2.7L (10/30 1900 to 10/31 0700) GI Imaging: 10/30 CT Angio Impression: Closed loop small bowel obstruction likely secondary to an internal hernia with findings concerning for developing bowel ischemia. Clinical correlation and surgical consult is advised. Status post right nephrectomy GI Surgeries / Procedures:  10/31: Exploratory Laparotomy, reduction of internal hernia, small bowel resection with cecectomy (length 110 cm), ileocolic anastomosis   Central access: no TPN start date: 04/28/2024  Nutritional Goals: Goal TPN rate is 75 mL/hr (provides 108 g of protein and 2005 kcals per day)  RD Assessment: Estimated Needs Total Energy Estimated Needs: 2000-2300kcal/day Total Protein Estimated Needs: 100-115g/day Total Fluid Estimated Needs: 1.8-2.1L/day  Current Nutrition:  NPO sips w/ meds  Plan:  ---Increase TPN to 75 ml/hr (goal rate) at 1800. Hx of HF will keep fluid low.  - will decrease phos from 15 to 5.  ---Electrolytes in TPN (standard): Na 50mEq/L, K 30mEq/L, Ca 19mEq/L, Mg 86mEq/L, and Phos 15mmol/L. Cl:Ac 1:1 ---Add standard  MVI and trace elements to TPN ---continue Sensitive q4h SSI and adjust as needed  --Monitor TPN labs on Mon/Thurs, dadily until stable  Cathaleen GORMAN Blanch, PharmD, BCPS 04/29/2024,10:05 AM

## 2024-04-29 NOTE — Progress Notes (Addendum)
 Pt removed mittens and double lumen PICC line while rounding. Swelling and bruising was noted on the site of removal. Pt is alert to self only.Pt has a continous TPN running at 75 ml/hr, IV fluids and IV abx all paused. MD Mansy made aware.  Update [phone]): See new orders.  Update 2330: Per IV team she is not trained to place a PICC line at this time, but they have one working in the morning to place one. MD Mansy made aware.  Update 0023: See new orders.

## 2024-04-29 NOTE — Plan of Care (Signed)
  Problem: Fluid Volume: Goal: Ability to maintain a balanced intake and output will improve Outcome: Progressing   Problem: Health Behavior/Discharge Planning: Goal: Ability to identify and utilize available resources and services will improve Outcome: Progressing Goal: Ability to manage health-related needs will improve Outcome: Progressing   Problem: Metabolic: Goal: Ability to maintain appropriate glucose levels will improve Outcome: Progressing

## 2024-04-29 NOTE — Progress Notes (Addendum)
 PROGRESS NOTE    Kylie [name]  FMW:[id number] DOB: 09/01/1955 DOA: 04/27/2024 PCP: [facility]  Chief Complaint  Patient presents with   Hypertension   Abdominal Pain    Hospital Course:  Kylie [name] is a 68 y.o. female with medical history significant of dementia, HTN, HLD, sCHF with EF 40-45%, CVA, CKD-3a, depression, DM, PE on Xarelto , s/p right nephrectomy in 2009 for right renal cell carcinoma presented with abdominal pain.  Poor historian due to dementia.  Workup revealed leukocytosis, lactic acidosis, imaging shows ischemic bowel disease, s/p exploratory laparotomy and small bowel resection with cecectomy on 10/30. Hospital course as below  Subjective: Patient was examined at bedside, has some discomfort in her throat (was intubated introp and has NG tube), otherwise denies any other complaints Called [name] Cousin (POA), left voicemail   Objective: Vitals:   04/29/24 1300 04/29/24 1400 04/29/24 1500 04/29/24 1524  BP: (!) 140/112 118/62 (!) 134/58 137/74  Pulse: (!) 108 90 91 96  Resp: 18 13 13 20   Temp:    98.6 F (37 C)  TempSrc:    Oral  SpO2: 100% 96% 99% 100%  Weight:      Height:        Intake/Output Summary (Last 24 hours) at 04/29/2024 1612 Last data filed at 04/29/2024 1525 Gross per 24 hour  Intake 2443.8 ml  Output 1078 ml  Net 1365.8 ml   Filed Weights   04/27/24 1826 04/28/24 0400 04/29/24 0500  Weight: 79.5 kg 79.6 kg 78.1 kg    Examination: General: alert, no acute distress Cardiac: Tachycardic, No murmurs, No gallops or rubs Respiratory: No rales, wheezing, rhonchi or rubs GI: mild tenderness consistent post op Ext: No pitting leg edema bilaterally. 1+DP/PT pulse bilaterally. Musculoskeletal: No joint deformities, No joint redness or warmth, no limitation of ROM in spin. Skin: No rashes.  Neuro: awake, alert, baseline confused, no gross focal deficits  Assessment & Plan:  Ischemic bowel disease and closed loop obstruction  of intestine (HCC): - S/p exploratory laparotomy, reduction of internal hernia, small bowel resection with cecectomy (110 cm), ileocolic anastomosis -on 10/30 - NPO, NG tube to suction, IV fluids - Continue IV Zosyn, leukocytosis 22.2 likely reactive - Has PICC line, TPN. Likely to have postop ileus - Surgery following, appreciate recs - Pain management, antiemetics prn  Hypertensive urgency - resolved HTN - On Lasix , Cozaar , clonidine  patch, amlodipine  at home - Hold all po meds - IV hydralazine  prn with holding parameters  AKI on CKD stage 3a - Cr inc 2.36 from 1.27, baseline Cr 1.0-1.3 - Increase IV fluids 100cc/hr - Monitor UO, if Cr continues to worsen will do further work up - Monitor Cr  Acute drop in Hb Normocytic anemia - Hb dropped from 12.6 on presentation to 8.3, no evidence of bleeding - Anticoagulation held - Monitor Hb and transfuse if < 7   History of pulmonary embolism - Hold Eliquis, resume when ok per Surgery - Received 4000 units of Kcentra for reversal on 10/30   Chronic combined systolic and diastolic CHF (congestive heart failure) (HCC): 2D echo on 10/08/2023 showed EF of 40-45% with grade 2 diastolic dysfunction.  Patient does not have leg edema or JVD.  CHF seems to be concentrated. - Hold Lasix , BNP not elevated - NPO, on IV fluids   Acquired hypothyroidism - On Levothyroxine  100mcg, start on IV Levothyroxine  75mcg   CVA (cerebral vascular accident) (HCC) - Hold Crestor , Eliquis   CAD (coronary artery disease): Troponin negative  x 2 -Hold Crestor  now   HLD (hyperlipidemia) -Hold Crestor    Depression -Hold Cymbalta  and olanzapine  due to QTc prolongation and npo  Dementia - Delirium precautions - Vit B12 1000 mcg IM once, po supplements once able to tolerate  -- PT/OT eval (likely tomorrow) -- Has foleys catheter - will remove tomorrow  DVT prophylaxis: SCD's   Code Status: Full Code Disposition:  TBD  Consultants:  Treatment Team:   Consulting Physician: Desiderio Schanz, MD  Procedures:  exploratory laparotomy, reduction of internal hernia, small bowel resection with cecectomy (110 cm), ileocolic anastomosis -on 10/30  Antimicrobials:  Anti-infectives (From admission, onward)    Start     Dose/Rate Route Frequency Ordered Stop   04/28/24 0600  piperacillin-tazobactam (ZOSYN) IVPB 3.375 g        3.375 g 12.5 mL/hr over 240 Minutes Intravenous Every 8 hours 04/28/24 0256     04/27/24 2300  cefoTEtan (CEFOTAN) 2 g in sodium chloride  0.9 % 100 mL IVPB        2 g 200 mL/hr over 30 Minutes Intravenous On call to O.R. 04/27/24 2255 04/28/24 1319       Data Reviewed: I have personally reviewed following labs and imaging studies CBC: Recent Labs  Lab 04/27/24 1834 04/28/24 0817 04/29/24 0640  WBC 12.4* 23.4* 22.2*  NEUTROABS 10.5*  --   --   HGB 12.6 11.2* 8.3*  HCT 38.7 34.0* 25.5*  MCV 95.8 96.3 98.5  PLT 266 210 183   Basic Metabolic Panel: Recent Labs  Lab 04/27/24 1834 04/28/24 0817 04/29/24 0640  NA 138 143 140  K 3.8 4.3 4.4  CL 100 106 104  CO2 23 22 23   GLUCOSE 238* 161* 152*  BUN 29* 29* 52*  CREATININE 1.16* 1.27* 2.36*  CALCIUM  9.6 8.5* 8.3*  MG 1.9  --  1.9  PHOS  --   --  4.9*   GFR: Estimated Creatinine Clearance: 24 mL/min (A) (by C-G formula based on SCr of 2.36 mg/dL (H)). Liver Function Tests: Recent Labs  Lab 04/27/24 1834  AST 27  ALT 21  ALKPHOS 85  BILITOT 1.1  PROT 8.2*  ALBUMIN 4.0   CBG: Recent Labs  Lab 04/29/24 0347 04/29/24 0728 04/29/24 1120 04/29/24 1215 04/29/24 1522  GLUCAP 154* 145* 127* 125* 136*    Recent Results (from the past 240 hours)  MRSA Next Gen by PCR, Nasal     Status: None   Collection Time: 04/28/24  4:14 AM   Specimen: Nasal Mucosa; Nasal Swab  Result Value Ref Range Status   MRSA by PCR Next Gen NOT DETECTED NOT DETECTED Final    Comment: (NOTE) The GeneXpert MRSA Assay (FDA approved for NASAL specimens only), is one  component of a comprehensive MRSA colonization surveillance program. It is not intended to diagnose MRSA infection nor to guide or monitor treatment for MRSA infections. Test performance is not FDA approved in patients less than 10 years old. Performed at [facility], [address] [place], [place] [address]      Radiology Studies: DG Chest 1 View Result Date: 04/29/2024 CLINICAL DATA:  [id number] Pulmonary edema [id number]. EXAM: CHEST  1 VIEW COMPARISON:  10/09/2023. FINDINGS: Bilateral lung fields are clear. Bilateral costophrenic angles are clear. Normal cardio-mediastinal silhouette. Mitral annulus calcifications noted. No acute osseous abnormalities. Enteric tube seen extending below the left hemidiaphragm. However, the tip and side hole are outside the field of view. Right-sided PICC line noted with its tip overlying the  cavoatrial junction region. There is small amount of free air under the left hemidiaphragm, likely related to recent surgery. IMPRESSION: No acute cardiopulmonary abnormality. Small amount of free air under the left hemidiaphragm, likely related to recent surgery. Electronically Signed   By: Ree Molt M.D.   On: 04/29/2024 13:51   DG Abd 1 View Result Date: 04/28/2024 EXAM: 1 VIEW XRAY OF THE ABDOMEN 04/28/2024 07:25:00 PM COMPARISON: 10/07/2023 CLINICAL HISTORY: [id number] Nasogastric tube present [id number] Nasogastric tube present FINDINGS: LINES, TUBES AND DEVICES: NG tube tip is in the proximal stomach with the side port near the GE junction. BOWEL: Nonobstructive bowel gas pattern. SOFT TISSUES: No opaque urinary calculi. BONES: No acute osseous abnormality. IMPRESSION: 1. NG tube tip in the proximal stomach with the side port near the GE junction. Consider advancing the tube so that the side port is well within the gastric lumen to reduce aspiration risk. Electronically signed by: Franky Crease MD 04/28/2024 07:28 PM EDT RP Workstation: [id number]   US  EKG SITE  RITE Result Date: 04/28/2024 If Site Rite image not attached, placement could not be confirmed due to current cardiac rhythm.  CT Angio Chest/Abd/Pel for Dissection W and/or Wo Contrast Result Date: 04/27/2024 CLINICAL DATA:  Chest pressure and hypertension. Acute aortic syndrome suspected. EXAM: CT ANGIOGRAPHY CHEST, ABDOMEN AND PELVIS TECHNIQUE: Non-contrast CT of the chest was initially obtained. Multidetector CT imaging through the chest, abdomen and pelvis was performed using the standard protocol during bolus administration of intravenous contrast. Multiplanar reconstructed images and MIPs were obtained and reviewed to evaluate the vascular anatomy. RADIATION DOSE REDUCTION: This exam was performed according to the departmental dose-optimization program which includes automated exposure control, adjustment of the mA and/or kV according to patient size and/or use of iterative reconstruction technique. CONTRAST:  75mL OMNIPAQUE  IOHEXOL  350 MG/ML SOLN COMPARISON:  Chest CT dated 10/07/2023. FINDINGS: Evaluation is limited due to streak artifact caused by patient's arms. CTA CHEST FINDINGS Cardiovascular: There is no cardiomegaly or pericardial effusion. Three-vessel coronary vascular calcification and calcification of the mitral annulus. Moderate atherosclerotic calcification of the thoracic aorta. No aneurysmal dilatation or dissection. The origins of the great vessels of the aortic arch and the central pulmonary arteries appear patent. Mediastinum/Nodes: No hilar or mediastinal adenopathy. The esophagus is grossly unremarkable. No mediastinal fluid collection. Lungs/Pleura: No focal consolidation, pleural effusion, pneumothorax. The central airways are patent. Musculoskeletal: Degenerative changes of the spine. No acute osseous pathology. Review of the MIP images confirms the above findings. CTA ABDOMEN AND PELVIS FINDINGS VASCULAR Aorta: Moderate atherosclerotic calcification of the abdominal aorta. No  aneurysmal dilatation or dissection. No periaortic fluid collection. Celiac: The celiac trunk and its major branches are patent. SMA: The SMA is patent. Renals: Atherosclerotic calcification of the origin of the left renal artery. The left renal artery is patent. Status post right nephrectomy. IMA: The origin of the IMA is patent. Inflow: Moderate atherosclerotic calcification of the iliac arteries. 80 and arteries are patent. No aneurysmal dilatation or dissection. Veins: An infrarenal IVC filter is noted.  No portal venous gas. Review of the MIP images confirms the above findings. NON-VASCULAR No intra-abdominal free air.  Small free fluid. Hepatobiliary: The liver is unremarkable. There is mild biliary dilatation, post cholecystectomy. Pancreas: The pancreas is grossly unremarkable as visualized. Spleen: Normal in size without focal abnormality. Adrenals/Urinary Tract: Status post right nephrectomy. No hydronephrosis on the left. The urinary bladder is grossly unremarkable. Stomach/Bowel: There is inflammatory changes and thickening of a cluster of small bowel loop  in the mid abdomen. There is apparent focal area of mesenteric defect (200/5 and coronal 41/8) with herniation of inflamed bowel loops. The bowel loops measure up to 3.7 cm in diameter. Findings most consistent with a closed loop obstruction likely secondary to an internal hernia. Surgical consult is advised. Several small scattered pockets of air along the wall of the dilated bowel noted which may be intraluminal but concerning for developing bowel ischemia. There is mild colonic diverticulosis. The partially visualized linear structure in the right lower quadrant may represent a normal appendix (43/8). Lymphatic: No adenopathy. Reproductive: The uterus is grossly unremarkable. No suspicious adnexal masses. Other: None Musculoskeletal: Degenerative changes of the spine. No acute osseous pathology. Review of the MIP images confirms the above findings.  IMPRESSION: 1. No aortic aneurysm or dissection. 2. Closed loop small bowel obstruction likely secondary to an internal hernia with findings concerning for developing bowel ischemia. Clinical correlation and surgical consult is advised. 3. Status post right nephrectomy. 4.  Aortic Atherosclerosis (ICD10-I70.0). Electronically Signed   By: Vanetta Chou M.D.   On: 04/27/2024 20:37   CT HEAD WO CONTRAST ( ) Result Date: 04/27/2024 EXAM: CT HEAD WITHOUT CONTRAST 04/27/2024 08:16:19 PM TECHNIQUE: CT of the head was performed without the administration of intravenous contrast. Automated exposure control, iterative reconstruction, and/or weight based adjustment of the mA/kV was utilized to reduce the radiation dose to as low as reasonably achievable. COMPARISON: Head CT 23:25. CLINICAL HISTORY: Headache, fever. FINDINGS: BRAIN AND VENTRICLES: No acute hemorrhage. No evidence of acute infarct. No hydrocephalus. No extra-axial collection. No mass effect or midline shift. There is atrophy and chronic small vessel ischemic changes of the white matter. Carotid vascular calcification. ORBITS: No acute abnormality. SINUSES: Mucosal thickening in the left sphenoid sinus. SOFT TISSUES AND SKULL: No acute soft tissue abnormality. No skull fracture. IMPRESSION: 1. No acute intracranial abnormality. 2. Cerebral atrophy and chronic small vessel ischemic changes of the white matter. Electronically signed by: Luke Bun MD 04/27/2024 08:32 PM EDT RP Workstation: [id number]    Scheduled Meds:  Chlorhexidine  Gluconate Cloth  6 each Topical Daily   insulin  aspart  0-6 Units Subcutaneous Q4H   [START ON 05/05/2024] levothyroxine   75 mcg Intravenous Daily   sodium chloride  flush  10-40 mL Intracatheter Q12H   Continuous Infusions:  lactated ringers  100 mL/hr at 04/29/24 1500   piperacillin-tazobactam (ZOSYN)  IV 12.5 mL/hr at 04/29/24 1500   TPN ADULT (ION) 40 mL/hr at 04/29/24 1500   TPN ADULT (ION)       LOS: 2  days  MDM: Patient is high risk for one or more organ failure.  They necessitate ongoing hospitalization for continued IV therapies and subsequent lab monitoring. Total time spent interpreting labs and vitals, reviewing the medical record, coordinating care amongst consultants and care team members, directly assessing and discussing care with the patient and/or family: 55 min Laree Lock, MD [facility]  To contact the attending physician between 7A-7P please use Epic Chat. To contact the covering physician during after hours 7P-7A, please review Amion.  04/29/2024, 4:12 PM   *This document has been created with the assistance of dictation software. Please excuse typographical errors. *

## 2024-04-29 NOTE — Evaluation (Signed)
 Physical Therapy Evaluation Patient Details Name: Kylie [name] MRN: [id number] DOB: June 08, 1956 Today's Date: 04/29/2024  History of Present Illness  Pt is a 68 y/o F admitted on 04/27/24 after presenting with c/o abdominal pain. Imaging showed ischemic bowel disease. Pt is s/p ex lap & small bowel resection with cecectomy on 04/27/24. PMH: dementia, HTN, HLD, sCHF, CVA, CKD 3A, depression, DM, PE on xarelto , s/p R nephrectomy (2009) 2/2 renal cell carcinoma  Clinical Impression  Pt seen for PT evaluation with pt agreeable to tx. Pt pleasantly confused throughout session, follows simple commands. Pt is able to complete bed mobility with mod assist, transfers with min assist & step to recliner. OT attempted to suction pt's mouth 2/2 increased secretions & pt with poor ability to manage. Recommend ongoing PT services to progress mobility as able.      If plan is discharge home, recommend the following: A lot of help with walking and/or transfers;A lot of help with bathing/dressing/bathroom   Can travel by private vehicle   No    Equipment Recommendations  (defer to next venue)  Recommendations for Other Services       Functional Status Assessment Patient has had a recent decline in their functional status and demonstrates the ability to make significant improvements in function in a reasonable and predictable amount of time.     Precautions / Restrictions Precautions Precautions: Fall Precaution/Restrictions Comments: NG tube, LLQ JP drain, log roll for comfort 2/2 abdominal incision Restrictions Weight Bearing Restrictions Per Provider Order: No      Mobility  Bed Mobility Overal bed mobility: Needs Assistance             General bed mobility comments: Pt lying very close to R side of bed, assisted with bringing BLE off EOB & uprighting trunk with mod assist.    Transfers Overall transfer level: Needs assistance Equipment used: Rolling Like (2 wheels) Transfers: Sit  to/from Stand Sit to Stand: Min assist           General transfer comment: sit>stand from EOB with RW & min assist    Ambulation/Gait Ambulation/Gait assistance: Min assist, +2 safety/equipment Gait Distance (Feet): 4 Feet Assistive device: Rolling Giesler (2 wheels) Gait Pattern/deviations: Decreased step length - right, Decreased step length - left, Decreased stride length Gait velocity: decreased     General Gait Details: side steps to R along EOB & to turn to Best Boy     Tilt Bed    Modified Rankin (Stroke Patients Only)       Balance Overall balance assessment: Needs assistance Sitting-balance support: Feet supported, Bilateral upper extremity supported Sitting balance-Leahy Scale: Fair Sitting balance - Comments: CGA static sitting EOB   Standing balance support: Bilateral upper extremity supported, During functional activity, Reliant on assistive device for balance Standing balance-Leahy Scale: Fair                               Pertinent Vitals/Pain Pain Assessment Pain Assessment: Faces Faces Pain Scale: Hurts even more Pain Location: L side of neck, abdominal incision Pain Descriptors / Indicators: Grimacing, Guarding, Discomfort Pain Intervention(s): Monitored during session, Repositioned    Home Living Family/patient expects to be discharged to:: Skilled nursing facility                   Additional Comments: Pt is poor historian, per  chart pt is from [facility].    Prior Function Prior Level of Function : Patient poor historian/Family not available             Mobility Comments: Pt reports she's ambulatory.       Extremity/Trunk Assessment   Upper Extremity Assessment Upper Extremity Assessment: Generalized weakness    Lower Extremity Assessment Lower Extremity Assessment: Generalized weakness    Cervical / Trunk Assessment Cervical / Trunk Assessment:   (abdominal incision)  Communication   Communication Communication: Impaired Factors Affecting Communication: Reduced clarity of speech (pt with increased secretions & difficulty managing them)    Cognition Arousal: Alert Behavior During Therapy: WFL for tasks assessed/performed   PT - Cognitive impairments: History of cognitive impairments                       PT - Cognition Comments: Per chart, pt with hx of dementia. Oriented to name & birthday but not age, reports she's at home. Pleasantly confused. Following commands: Impaired Following commands impaired: Follows one step commands with increased time     Cueing Cueing Techniques: Verbal cues     General Comments General comments (skin integrity, edema, etc.): Max HR 138 bpm, c/o dizziness sitting EOB but BP stable.    Exercises     Assessment/Plan    PT Assessment Patient needs continued PT services  PT Problem List Decreased strength;Pain;Decreased cognition;Decreased activity tolerance;Decreased balance;Decreased mobility;Decreased safety awareness;Decreased knowledge of use of DME;Decreased knowledge of precautions       PT Treatment Interventions DME instruction;Balance training;Gait training;Neuromuscular re-education;Stair training;Functional mobility training;Patient/family education;Therapeutic activities;Wheelchair mobility training;Therapeutic exercise;Manual techniques    PT Goals (Current goals can be found in the Care Plan section)  Acute Rehab PT Goals Patient Stated Goal: decreased pain PT Goal Formulation: With patient Time For Goal Achievement: 05/13/24 Potential to Achieve Goals: Good    Frequency Min 2X/week     Co-evaluation PT/OT/SLP Co-Evaluation/Treatment: Yes Reason for Co-Treatment: For patient/therapist safety;Necessary to address cognition/behavior during functional activity PT goals addressed during session: Mobility/safety with mobility;Balance;Proper use of DME          AM-PAC PT 6 Clicks Mobility  Outcome Measure Help needed turning from your back to your side while in a flat bed without using bedrails?: A Little Help needed moving from lying on your back to sitting on the side of a flat bed without using bedrails?: A Lot Help needed moving to and from a bed to a chair (including a wheelchair)?: A Little Help needed standing up from a chair using your arms (e.g., wheelchair or bedside chair)?: A Little Help needed to walk in hospital room?: A Lot Help needed climbing 3-5 steps with a railing? : Total 6 Click Score: 14    End of Session   Activity Tolerance: Patient tolerated treatment well Patient left: in chair;with chair alarm set;with call bell/phone within reach Nurse Communication: Mobility status PT Visit Diagnosis: Muscle weakness (generalized) (M62.81);Unsteadiness on feet (R26.81);Pain Pain - part of body:  (abdomen)    Time: 8952-8896 PT Time Calculation (min) (ACUTE ONLY): 16 min   Charges:   PT Evaluation $PT Eval Moderate Complexity: 1 Mod   PT General Charges $$ ACUTE PT VISIT: 1 Visit         Richerd Pinal, PT, DPT 04/29/24, 12:29 PM   Richerd CHRISTELLA Pinal 04/29/2024, 12:28 PM

## 2024-04-29 NOTE — Evaluation (Signed)
 Occupational Therapy Evaluation Patient Details Name: Kylie [name] MRN: [id number] DOB: 1956-05-28 Today's Date: 04/29/2024   History of Present Illness   Pt is a 68 y/o F admitted on 04/27/24 after presenting with c/o abdominal pain. Imaging showed ischemic bowel disease. Pt is s/p ex lap & small bowel resection with cecectomy on 04/27/24. PMH: dementia, HTN, HLD, sCHF, CVA, CKD 3A, depression, DM, PE on xarelto , s/p R nephrectomy (2009) 2/2 renal cell carcinoma     Clinical Impressions Chart reviewed to date, pt greeted semi supine in bed, oriented to self only. She appears restless at times, improved with cueing and mobility. Pt is a poor historian, per chart she is from LTC at [facility], also is a Nurse, Learning Disability. Pt presents with deficits in strength, endurance, activity tolerance, balance, cognition, affecting safe and optimal ADL completion. MIN A required for STS with RW, MIN A for short amb transfer to bedside chair with RW. MIN A required for grooming tasks. MAX A reqruied for LB dressing. Frequent multi modal cues required for technique throughout. Pt is left in chair, all needs met. OT will follow to facilitate optimal ADL/functional mobility performance.  HR max 138 bpm, pt c/o dizziness with positional changes but BP stable. PICC, NG, foley, JP intact pre/post session. Dressing inspected pre/post session and appears the same with discharge noted at inferior portion of dressing.       If plan is discharge home, recommend the following:   A lot of help with walking and/or transfers;A lot of help with bathing/dressing/bathroom;Supervision due to cognitive status     Functional Status Assessment   Patient has had a recent decline in their functional status and demonstrates the ability to make significant improvements in function in a reasonable and predictable amount of time.     Equipment Recommendations   Other (comment) (defer to next venue of care)      Recommendations for Other Services         Precautions/Restrictions   Precautions Precautions: Fall Recall of Precautions/Restrictions: Impaired Precaution/Restrictions Comments: NG tube, LLQ JP drain, log roll for comfort 2/2 abdominal incision Restrictions Weight Bearing Restrictions Per Provider Order: No     Mobility Bed Mobility Overal bed mobility: Needs Assistance Bed Mobility: Sidelying to Sit   Sidelying to sit: Mod assist            Transfers Overall transfer level: Needs assistance Equipment used: Rolling Stopa (2 wheels) Transfers: Sit to/from Stand Sit to Stand: Min assist                  Balance Overall balance assessment: Needs assistance Sitting-balance support: Feet supported, Bilateral upper extremity supported Sitting balance-Leahy Scale: Fair     Standing balance support: Bilateral upper extremity supported, During functional activity, Reliant on assistive device for balance Standing balance-Leahy Scale: Fair                             ADL either performed or assessed with clinical judgement   ADL Overall ADL's : Needs assistance/impaired Eating/Feeding: NPO   Grooming: Wash/dry face;Oral care;Sitting;Minimal assistance           Upper Body Dressing : Moderate assistance;Sitting   Lower Body Dressing: Maximal assistance;Sitting/lateral leans   Toilet Transfer: Minimal assistance;Rolling Kreft (2 wheels) Toilet Transfer Details (indicate cue type and reason): simulated, short amb transfer to bedside chair           General ADL Comments:  step by step multi modal cues for one step direction following     Vision Patient Visual Report: No change from baseline       Perception         Praxis         Pertinent Vitals/Pain Pain Assessment Pain Assessment: Faces Faces Pain Scale: Hurts even more Pain Location: L side of neck, abdominal incision Pain Descriptors / Indicators: Grimacing, Guarding,  Discomfort Pain Intervention(s): Monitored during session, Repositioned     Extremity/Trunk Assessment Upper Extremity Assessment Upper Extremity Assessment: Generalized weakness   Lower Extremity Assessment Lower Extremity Assessment: Generalized weakness   Cervical / Trunk Assessment Cervical / Trunk Assessment:  (abdominal incision)   Communication Communication Communication: Impaired Factors Affecting Communication: Reduced clarity of speech (garbled speech, increased secretions; improved with cough and use of yankaur)   Cognition Arousal: Alert Behavior During Therapy: WFL for tasks assessed/performed, Restless Cognition: History of cognitive impairments, No family/caregiver present to determine baseline, Cognition impaired   Orientation impairments: Time, Situation, Place Awareness: Intellectual awareness impaired, Online awareness impaired Memory impairment (select all impairments): Short-term memory, Declarative long-term memory Attention impairment (select first level of impairment): Focused attention Executive functioning impairment (select all impairments): Sequencing, Reasoning, Problem solving OT - Cognition Comments: pt reports she is at home                 Following commands: Impaired Following commands impaired: Follows one step commands with increased time     Cueing  General Comments   Cueing Techniques: Verbal cues  Max HR 138 bpm, pt c/o dizziness sitting edge of bed but BP stable   Exercises Other Exercises Other Exercises: edu re role of OT, role of rehab   Shoulder Instructions      Home Living Family/patient expects to be discharged to:: Skilled nursing facility                                 Additional Comments: pt is a poor historian, per chart is LTC [facility], is a PACE participant      Prior Functioning/Environment Prior Level of Function : Patient poor historian/Family not available              Mobility Comments: pt reports she amb with no AD ADLs Comments: will need to confirm    OT Problem List: Impaired balance (sitting and/or standing);Decreased activity tolerance;Decreased knowledge of use of DME or AE;Decreased cognition;Decreased safety awareness;Decreased knowledge of precautions   OT Treatment/Interventions: Self-care/ADL training;Therapeutic exercise;Therapeutic activities;DME and/or AE instruction;Patient/family education;Balance training      OT Goals(Current goals can be found in the care plan section)   Acute Rehab OT Goals Patient Stated Goal: sit in this chair OT Goal Formulation: With patient Time For Goal Achievement: 05/13/24 Potential to Achieve Goals: Good ADL Goals Pt Will Perform Grooming: with supervision;sitting Pt Will Perform Lower Body Dressing: with supervision;sitting/lateral leans;sit to/from stand Pt Will Transfer to Toilet: with supervision;ambulating Pt Will Perform Toileting - Clothing Manipulation and hygiene: with supervision;sitting/lateral leans;sit to/from stand   OT Frequency:  Min 2X/week    Co-evaluation PT/OT/SLP Co-Evaluation/Treatment: Yes Reason for Co-Treatment: For patient/therapist safety;Necessary to address cognition/behavior during functional activity PT goals addressed during session: Mobility/safety with mobility;Balance;Proper use of DME OT goals addressed during session: ADL's and self-care      AM-PAC OT 6 Clicks Daily Activity     Outcome Measure Help from another person eating  meals?: A Little (anticipate, Pt is NPO) Help from another person taking care of personal grooming?: A Little Help from another person toileting, which includes using toliet, bedpan, or urinal?: A Lot Help from another person bathing (including washing, rinsing, drying)?: A Lot Help from another person to put on and taking off regular upper body clothing?: A Little Help from another person to put on and taking off regular lower  body clothing?: A Lot 6 Click Score: 15   End of Session Equipment Utilized During Treatment: Rolling Branden (2 wheels) Nurse Communication: Mobility status  Activity Tolerance: Patient tolerated treatment well Patient left: in chair;with call bell/phone within reach;with chair alarm set                   Time: 1046-1105 OT Time Calculation (min): 19 min Charges:  OT General Charges $OT Visit: 1 Visit OT Evaluation $OT Eval High Complexity: 1 High  Therisa Sheffield, OTD OTR/L  04/29/24, 1:05 PM

## 2024-04-29 NOTE — Progress Notes (Signed)
 [facility] SURGICAL PROGRESS NOTE  Hospital Day(s): 2.   Post op day(s): 2 Days Post-Op.   Interval History:  Patient reports having some pain and soreness in her abdomen.  Surgical drain is more serous today.  White count continues to be elevated.  There is some bilious output in her NG tube and no recording of bowel movements.  Vital signs in last 24 hours: [min-max] current  Temp:  [98.4 F (36.9 C)-99.3 F (37.4 C)] 98.7 F (37.1 C) (11/01 2000) Pulse Rate:  [84-113] 105 (11/01 2000) Resp:  [11-31] 17 (11/01 2000) BP: (109-150)/(52-114) 150/77 (11/01 2000) SpO2:  [94 %-100 %] 100 % (11/01 2000) Weight:  [78.1 kg] 78.1 kg (11/01 0500)     Height: 5' 10 (177.8 cm) Weight: 78.1 kg BMI (Calculated): 24.71   Intake/Output last 2 shifts:  10/31 0701 - 11/01 0700 In: 2220.9 [I.V.:1760.5; IV Piggyback:460.5] Out: 995 [Urine:505; Emesis/NG output:300; Drains:190]   Physical Exam:  Constitutional: alert, cooperative and no distress  HEENT: NGT in place  Respiratory: breathing non-labored at rest Cardiovascular: regular rate and sinus rhythm  Gastrointestinal: Soft, incisional soreness, she does not appear overtly distended. Surgical drain in left abdomen; output more serous today. Genitourinary; Foley in place Integumentary: Laparotomy is intact with staples, drainage inferiorly on honeycomb  Labs:     Latest Ref Rng & Units 04/29/2024    6:40 AM 04/28/2024    8:17 AM 04/27/2024    6:34 PM  CBC  WBC 4.0 - 10.5 K/uL 22.2  23.4  12.4   Hemoglobin 12.0 - 15.0 g/dL 8.3  88.7  87.3   Hematocrit 36.0 - 46.0 % 25.5  34.0  38.7   Platelets 150 - 400 K/uL 183  210  266       Latest Ref Rng & Units 04/29/2024    6:40 AM 04/28/2024    8:17 AM 04/27/2024    6:34 PM  CMP  Glucose 70 - 99 mg/dL 847  838  761   BUN 8 - 23 mg/dL 52  29  29   Creatinine 0.44 - 1.00 mg/dL 7.63  8.72  8.83   Sodium 135 - 145 mmol/L 140  143  138   Potassium 3.5 - 5.1 mmol/L 4.4  4.3   3.8   Chloride 98 - 111 mmol/L 104  106  100   CO2 22 - 32 mmol/L 23  22  23    Calcium  8.9 - 10.3 mg/dL 8.3  8.5  9.6   Total Protein 6.5 - 8.1 g/dL   8.2   Total Bilirubin 0.0 - 1.2 mg/dL   1.1   Alkaline Phos 38 - 126 U/L   85   AST 15 - 41 U/L   27   ALT 0 - 44 U/L   21      Imaging studies: No new pertinent imaging studies   Assessment/Plan: 68 y.o. female 2 Days Post-Op s/p exploratory laparotomy with small bowel resection, ileocecectomy, ileocolonic anastomosis and reduction of internal hernia   - This evening NG tube came out but only put out 100 throughout the day.  Okay to leave out but if she starts to have increased distention or emesis will need NG tube replaced. - Continue TPN.  - Continue foley catheter for today; monitor and record UO - Continue IV Abx (Zosyn) - Monitor abdominal examination; on-going bowel function    - Monitor leukocytosis; likely reactive it did have a slight decrease this morning.  - Pain control  prn; antiemetics prn - Needs to get out of bed and move around.  Recommend therapies engagement - Further management per primary service; we will follow     -- Jayson MALVA Endow

## 2024-04-30 ENCOUNTER — Inpatient Hospital Stay

## 2024-04-30 ENCOUNTER — Other Ambulatory Visit: Payer: Self-pay

## 2024-04-30 DIAGNOSIS — K559 Vascular disorder of intestine, unspecified: Secondary | ICD-10-CM | POA: Diagnosis not present

## 2024-04-30 LAB — GLUCOSE, CAPILLARY
Glucose-Capillary: 112 mg/dL — ABNORMAL HIGH (ref 70–99)
Glucose-Capillary: 112 mg/dL — ABNORMAL HIGH (ref 70–99)
Glucose-Capillary: 115 mg/dL — ABNORMAL HIGH (ref 70–99)
Glucose-Capillary: 122 mg/dL — ABNORMAL HIGH (ref 70–99)
Glucose-Capillary: 128 mg/dL — ABNORMAL HIGH (ref 70–99)
Glucose-Capillary: 155 mg/dL — ABNORMAL HIGH (ref 70–99)

## 2024-04-30 LAB — CBC
HCT: 23.4 % — ABNORMAL LOW (ref 36.0–46.0)
Hemoglobin: 7.2 g/dL — ABNORMAL LOW (ref 12.0–15.0)
MCH: 31.3 pg (ref 26.0–34.0)
MCHC: 30.8 g/dL (ref 30.0–36.0)
MCV: 101.7 fL — ABNORMAL HIGH (ref 80.0–100.0)
Platelets: 163 K/uL (ref 150–400)
RBC: 2.3 MIL/uL — ABNORMAL LOW (ref 3.87–5.11)
RDW: 13.7 % (ref 11.5–15.5)
WBC: 20.5 K/uL — ABNORMAL HIGH (ref 4.0–10.5)
nRBC: 0 % (ref 0.0–0.2)

## 2024-04-30 LAB — PHOSPHORUS: Phosphorus: 2.3 mg/dL — ABNORMAL LOW (ref 2.5–4.6)

## 2024-04-30 LAB — IRON AND TIBC
Iron: 13 ug/dL — ABNORMAL LOW (ref 28–170)
Saturation Ratios: 7 % — ABNORMAL LOW (ref 10.4–31.8)
TIBC: 185 ug/dL — ABNORMAL LOW (ref 250–450)
UIBC: 172 ug/dL

## 2024-04-30 LAB — BASIC METABOLIC PANEL WITH GFR
Anion gap: 7 (ref 5–15)
BUN: 46 mg/dL — ABNORMAL HIGH (ref 8–23)
CO2: 27 mmol/L (ref 22–32)
Calcium: 8.4 mg/dL — ABNORMAL LOW (ref 8.9–10.3)
Chloride: 109 mmol/L (ref 98–111)
Creatinine, Ser: 1.49 mg/dL — ABNORMAL HIGH (ref 0.44–1.00)
GFR, Estimated: 38 mL/min — ABNORMAL LOW (ref >60)
Glucose, Bld: 127 mg/dL — ABNORMAL HIGH (ref 70–99)
Potassium: 3.5 mmol/L (ref 3.5–5.1)
Sodium: 143 mmol/L (ref 135–145)

## 2024-04-30 LAB — FOLATE: Folate: 12.4 ng/mL (ref >5.9)

## 2024-04-30 LAB — FERRITIN: Ferritin: 185 ng/mL (ref 11–307)

## 2024-04-30 LAB — MAGNESIUM: Magnesium: 2 mg/dL (ref 1.7–2.4)

## 2024-04-30 MED ORDER — POTASSIUM PHOSPHATES 15 MMOLE/5ML IV SOLN
15.0000 mmol | Freq: Once | INTRAVENOUS | Status: AC
  Administered 2024-04-30: 15 mmol via INTRAVENOUS
  Filled 2024-04-30: qty 5

## 2024-04-30 MED ORDER — DEXTROSE IN LACTATED RINGERS 5 % IV SOLN: INTRAVENOUS | Status: DC

## 2024-04-30 MED ORDER — TRACE MINERALS CU-MN-SE-ZN 300-55-60-3000 MCG/ML IV SOLN
INTRAVENOUS | Status: AC
  Filled 2024-04-30: qty 720

## 2024-04-30 NOTE — Plan of Care (Signed)
  Problem: Clinical Measurements: Goal: Will remain free from infection Outcome: Progressing   Problem: Clinical Measurements: Goal: Respiratory complications will improve Outcome: Progressing   Problem: Nutrition: Goal: Adequate nutrition will be maintained Outcome: Progressing   Problem: Pain Managment: Goal: General experience of comfort will improve and/or be controlled Outcome: Progressing   Problem: Safety: Goal: Ability to remain free from injury will improve Outcome: Progressing   Problem: Clinical Measurements: Goal: Cardiovascular complication will be avoided Outcome: Progressing

## 2024-04-30 NOTE — Progress Notes (Signed)
 Peripherally Inserted Central Catheter Placement  The IV Nurse has discussed with the patient and/or persons authorized to consent for the patient, the purpose of this procedure and the potential benefits and risks involved with this procedure.  The benefits include less needle sticks, lab draws from the catheter, and the patient may be discharged home with the catheter. Risks include, but not limited to, infection, bleeding, blood clot (thrombus formation), and puncture of an artery; nerve damage and irregular heartbeat and possibility to perform a PICC exchange if needed/ordered by physician.  Alternatives to this procedure were also discussed.  Bard Power PICC patient education guide, fact sheet on infection prevention and patient information card has been provided to patient /or left at bedside. Telephone consent obtained from [employer].   PICC Placement Documentation  PICC Double Lumen 04/30/24 Right Basilic 37 cm 0 cm (Active)  Indication for Insertion or Continuance of Line Administration of hyperosmolar/irritating solutions (i.e. TPN, Vancomycin, etc.) 04/30/24 1216  Exposed Catheter (cm) 0 cm 04/30/24 1216  Site Assessment Clean, Dry, Intact 04/30/24 1216  Lumen #1 Status Flushed;Saline locked;Blood return noted 04/30/24 1216  Lumen #2 Status Flushed;Saline locked;Blood return noted 04/30/24 1216  Dressing Type Transparent;Securing device 04/30/24 1216  Dressing Status Antimicrobial disc/dressing in place;Clean, Dry, Intact 04/30/24 1216  Line Care Connections checked and tightened 04/30/24 1216  Line Adjustment (NICU/IV Team Only) No 04/30/24 1216  Dressing Intervention New dressing;Adhesive placed at insertion site (IV team only);Adhesive placed around edges of dressing (IV team/ICU RN only) 04/30/24 1216  Dressing Change Due 05/07/24 04/30/24 1216       Kylie [name] 04/30/2024, 12:16 PM

## 2024-04-30 NOTE — Progress Notes (Addendum)
 PROGRESS NOTE    Kylie [name]  FMW:[id number] DOB: [birth year] DOA: 04/27/2024 PCP: [facility]  Chief Complaint  Patient presents with   Hypertension   Abdominal Pain    Hospital Course:  Kylie [name] is a 68 y.o. female with medical history significant of dementia, HTN, HLD, sCHF with EF 40-45%, CVA, CKD-3a, depression, DM, PE on Xarelto , s/p right nephrectomy in 2009 for right renal cell carcinoma presented with abdominal pain.  Poor historian due to dementia.  Workup revealed leukocytosis, lactic acidosis, imaging shows ischemic bowel disease, s/p exploratory laparotomy and small bowel resection with cecectomy on 10/30. Hospital course as below  Subjective: Patient was examined at bedside, states she feels better today.  Throat discomfort improving Pulled out PICC line overnight, placed again this morning Called [name]), provided updates and answered all questions   Objective: Vitals:   04/29/24 2000 04/29/24 2145 04/30/24 0500 04/30/24 0753  BP: (!) 150/77 (!) 161/51  (!) 143/57  Pulse: (!) 105 (!) 110  92  Resp: 17 18  18   Temp: 98.7 F (37.1 C) 98.6 F (37 C)  98.3 F (36.8 C)  TempSrc: Oral Oral  Oral  SpO2: 100% 100%  100%  Weight:   82.7 kg   Height:        Intake/Output Summary (Last 24 hours) at 04/30/2024 1428 Last data filed at 04/30/2024 1010 Gross per 24 hour  Intake 2445.95 ml  Output 765 ml  Net 1680.95 ml   Filed Weights   04/28/24 0400 04/29/24 0500 04/30/24 0500  Weight: 79.6 kg 78.1 kg 82.7 kg    Examination: General: alert, no acute distress Cardiac: RRR, No murmurs, No gallops or rubs Respiratory: No rales, wheezing, rhonchi or rubs GI: mild tenderness consistent post op Ext: No pitting leg edema bilaterally. 1+DP/PT pulse bilaterally. Musculoskeletal: No joint deformities, No joint redness or warmth, no limitation of ROM in spin. Skin: No rashes.  Neuro: awake, alert, baseline confused, no gross focal  deficits  Assessment & Plan:  Ischemic bowel disease and closed loop obstruction of intestine (HCC): - S/p exploratory laparotomy, reduction of internal hernia, small bowel resection with cecectomy (110 cm), ileocolic anastomosis -on 10/30 - NPO, IV fluids. NG tube out by patient, hold off on placing NG tube - Continue IV Zosyn, leukocytosis 20.5 likely reactive - Has PICC line, TPN, due to postop ileus - Surgery following, appreciate recs - Pain management, antiemetics prn  Hypertensive urgency - resolved HTN - On Lasix , Cozaar , clonidine  patch, amlodipine  at home - Hold all po meds - IV hydralazine  prn with holding parameters  AKI on CKD stage 3a - Cr inc 2.36 -> 1.49, baseline Cr 1.0-1.3 - On IV fluids 100cc/hr - Monitor UO, Cr  Hypokalemia Hypophosphatemia - Mag normal - Monitor and replete as needed, pharmacy consulted  Acute drop in Hb Normocytic anemia - Hb dropped from 12.6 -> 8.3 -> 7.2, no evidence of bleeding.  multifactorial due to postop, iatrogenic, receiving IV fluids - Anticoagulation held - Iron studies, B12, FA - Monitor Hb and transfuse if < 7   History of pulmonary embolism - Hold Eliquis, resume when ok per Surgery - Received 4000 units of Kcentra for reversal on 10/30   Chronic combined systolic and diastolic CHF (congestive heart failure) (HCC): 2D echo on 10/08/2023 showed EF of 40-45% with grade 2 diastolic dysfunction.  Patient does not have leg edema or JVD.  CHF seems to be concentrated. - Hold Lasix , BNP not elevated - NPO,  on IV fluids   Acquired hypothyroidism - On Levothyroxine  , start on IV Levothyroxine  75mcg   CVA (cerebral vascular accident) (HCC) - Hold Crestor , Eliquis   CAD (coronary artery disease): Troponin negative x 2 -Hold Crestor  now   HLD (hyperlipidemia) -Hold Crestor    Depression -Hold Cymbalta  and olanzapine  due to QTc prolongation and npo  Dementia - Delirium precautions - Vit B12 1000 mcg IM once, po  supplements once able to tolerate  -- PT/OT eval -- Has foleys catheter - will remove tomorrow  DVT prophylaxis: SCD's   Code Status: Full Code Disposition:  TBD  Consultants:  Treatment Team:  Consulting Physician: Desiderio Schanz, MD  Procedures:  exploratory laparotomy, reduction of internal hernia, small bowel resection with cecectomy (110 cm), ileocolic anastomosis -on 10/30  Antimicrobials:  Anti-infectives (From admission, onward)    Start     Dose/Rate Route Frequency Ordered Stop   04/28/24 0600  piperacillin-tazobactam (ZOSYN) IVPB 3.375 g        3.375 g 12.5 mL/hr over 240 Minutes Intravenous Every 8 hours 04/28/24 0256     04/27/24 2300  cefoTEtan (CEFOTAN) 2 g in sodium chloride  0.9 % 100 mL IVPB        2 g 200 mL/hr over 30 Minutes Intravenous On call to O.R. 04/27/24 2255 04/28/24 1319       Data Reviewed: I have personally reviewed following labs and imaging studies CBC: Recent Labs  Lab 04/27/24 1834 04/28/24 0817 04/29/24 0640 04/30/24 0456  WBC 12.4* 23.4* 22.2* 20.5*  NEUTROABS 10.5*  --   --   --   HGB 12.6 11.2* 8.3* 7.2*  HCT 38.7 34.0* 25.5* 23.4*  MCV 95.8 96.3 98.5 101.7*  PLT 266 210 183 163   Basic Metabolic Panel: Recent Labs  Lab 04/27/24 1834 04/28/24 0817 04/29/24 0640 04/30/24 0456  NA 138 143 140 143  K 3.8 4.3 4.4 3.5  CL 100 106 104 109  CO2 23 22 23 27   GLUCOSE 238* 161* 152* 127*  BUN 29* 29* 52* 46*  CREATININE 1.16* 1.27* 2.36* 1.49*  CALCIUM  9.6 8.5* 8.3* 8.4*  MG 1.9  --  1.9 2.0  PHOS  --   --  4.9* 2.3*   GFR: Estimated Creatinine Clearance: 41.2 mL/min (A) (by C-G formula based on SCr of 1.49 mg/dL (H)). Liver Function Tests: Recent Labs  Lab 04/27/24 1834  AST 27  ALT 21  ALKPHOS 85  BILITOT 1.1  PROT 8.2*  ALBUMIN 4.0   CBG: Recent Labs  Lab 04/29/24 2001 04/30/24 0033 04/30/24 0520 04/30/24 0807 04/30/24 1117  GLUCAP 146* 112* 115* 112* 122*    Recent Results (from the past 240 hours)   MRSA Next Gen by PCR, Nasal     Status: None   Collection Time: 04/28/24  4:14 AM   Specimen: Nasal Mucosa; Nasal Swab  Result Value Ref Range Status   MRSA by PCR Next Gen NOT DETECTED NOT DETECTED Final    Comment: (NOTE) The GeneXpert MRSA Assay (FDA approved for NASAL specimens only), is one component of a comprehensive MRSA colonization surveillance program. It is not intended to diagnose MRSA infection nor to guide or monitor treatment for MRSA infections. Test performance is not FDA approved in patients less than 57 years old. Performed at [facility], [address] [place], [place] [address]      Radiology Studies: DG Abd 1 View Result Date: 04/30/2024 EXAM: 1 VIEW XRAY OF THE ABDOMEN 04/30/2024 09:20:22 AM CLINICAL  HISTORY: Ileus (HCC) COMPARISON: None available. FINDINGS: LINES, TUBES AND DEVICES: Surgical drain and skin staples seen in the pelvis, right quadrant surgical clips and embolization coil again seen, IVC filter again noted. Nasogastric tube has been removed. BOWEL: Mildly dilated small bowel loops are seen in the left abdomen with gas also seen within nondilated colon. This likely represents a postoperative ileus. SOFT TISSUES: No opaque urinary calculi. BONES: No acute osseous abnormality. IMPRESSION: 1. Mildly dilated small bowel loops in the left abdomen, likely representing postoperative ileus. Electronically signed by: Norleen Kil MD 04/30/2024 09:26 AM EST RP Workstation: [id number]   US  EKG SITE RITE Result Date: 04/30/2024 If Site Rite image not attached, placement could not be confirmed due to current cardiac rhythm.  DG Chest 1 View Result Date: 04/29/2024 CLINICAL DATA:  [id number] Pulmonary edema [id number]. EXAM: CHEST  1 VIEW COMPARISON:  10/09/2023. FINDINGS: Bilateral lung fields are clear. Bilateral costophrenic angles are clear. Normal cardio-mediastinal silhouette. Mitral annulus calcifications noted. No acute osseous abnormalities. Enteric tube  seen extending below the left hemidiaphragm. However, the tip and side hole are outside the field of view. Right-sided PICC line noted with its tip overlying the cavoatrial junction region. There is small amount of free air under the left hemidiaphragm, likely related to recent surgery. IMPRESSION: No acute cardiopulmonary abnormality. Small amount of free air under the left hemidiaphragm, likely related to recent surgery. Electronically Signed   By: Ree Molt M.D.   On: 04/29/2024 13:51   DG Abd 1 View Result Date: 04/28/2024 EXAM: 1 VIEW XRAY OF THE ABDOMEN 04/28/2024 07:25:00 PM COMPARISON: 10/07/2023 CLINICAL HISTORY: [id number] Nasogastric tube present [id number] Nasogastric tube present FINDINGS: LINES, TUBES AND DEVICES: NG tube tip is in the proximal stomach with the side port near the GE junction. BOWEL: Nonobstructive bowel gas pattern. SOFT TISSUES: No opaque urinary calculi. BONES: No acute osseous abnormality. IMPRESSION: 1. NG tube tip in the proximal stomach with the side port near the GE junction. Consider advancing the tube so that the side port is well within the gastric lumen to reduce aspiration risk. Electronically signed by: Franky Crease MD 04/28/2024 07:28 PM EDT RP Workstation: [id number]    Scheduled Meds:  Chlorhexidine  Gluconate Cloth  6 each Topical Daily   insulin  aspart  0-6 Units Subcutaneous Q4H   [START ON 05/05/2024] levothyroxine   75 mcg Intravenous Daily   sodium chloride  flush  10-40 mL Intracatheter Q12H   Continuous Infusions:  dextrose  5% lactated ringers  100 mL/hr at 04/30/24 0941   piperacillin-tazobactam (ZOSYN)  IV Stopped (04/30/24 1010)   potassium PHOSPHATE IVPB (in mmol) 15 mmol (04/30/24 1246)   TPN ADULT (ION) Stopped (04/29/24 2300)   TPN ADULT (ION)       LOS: 3 days  MDM: Patient is high risk for one or more organ failure.  They necessitate ongoing hospitalization for continued IV therapies and subsequent lab monitoring. Total time spent  interpreting labs and vitals, reviewing the medical record, coordinating care amongst consultants and care team members, directly assessing and discussing care with the patient and/or family: 55 min Laree Lock, MD [facility]  To contact the attending physician between 7A-7P please use Epic Chat. To contact the covering physician during after hours 7P-7A, please review Amion.  04/30/2024, 2:28 PM   *This document has been created with the assistance of dictation software. Please excuse typographical errors. *

## 2024-04-30 NOTE — Progress Notes (Addendum)
 PHARMACY - TOTAL PARENTERAL NUTRITION CONSULT NOTE   Indication: Small bowel obstruction  Patient Measurements: Height: 5' 10 (177.8 cm) Weight: 82.7 kg (182 lb 5.1 oz) IBW/kg (Calculated) : 68.5 TPN AdjBW (KG): 79.6 Body mass index is 26.16 kg/m. Usual Weight: 75.8 kg (02/07/24)  Assessment:  Icel Castles is a 68yoF that presented with upper abdominal pain. Patient's past medical history notable for dementia, HTN, HLD, sCHF with EF 40-45% (10/08/23), CVA, CKD-3a, depression, DM, PE on Xarelto , and s/p right nephrectomy in 2009 for right renal cell carcinoma.  Glucose / Insulin :  BG 115-127. Novolog  in the last 24 hours : 0 units.  Electrolytes:  Phos 4.9 > 2.3 Renal:  Scr 1.27 > 2.36 > 1.49 (BL ~0.9-1) Hepatic: 10/30 labs  Alk phos 85, AST 27, ALT 21, total bili 1.1, albumin 4.0, INR 1.1 Intake / Output; MIVF:  +4.1 L (10/30 1900 to 10/31 0700) GI Imaging: 10/30 CT Angio Impression: Closed loop small bowel obstruction likely secondary to an internal hernia with findings concerning for developing bowel ischemia. Clinical correlation and surgical consult is advised. Status post right nephrectomy GI Surgeries / Procedures:  10/31: Exploratory Laparotomy, reduction of internal hernia, small bowel resection with cecectomy (length 110 cm), ileocolic anastomosis   Central access: no TPN start date: 04/28/2024  Nutritional Goals: Goal TPN rate is 75 mL/hr (provides 108 g of protein and 2005 kcals per day)  RD Assessment: Estimated Needs Total Energy Estimated Needs: 2000-2300kcal/day Total Protein Estimated Needs: 100-115g/day Total Fluid Estimated Needs: 1.8-2.1L/day  Current Nutrition:  NPO sips w/ meds  Plan:  ---Continue TPN at 75 ml/hr (goal rate) at 1800. Hx of HF will keep fluid low.   - Will give IV kphos 15 mmol x 1.  ---Electrolytes in TPN (standard): Na 50mEq/L, K 15mEq/L, Ca 52mEq/L, Mg 29mEq/L, and Phos 15mmol/L. Cl:Ac 1:1 ---Add standard MVI and trace  elements to TPN ---continue Sensitive q4h SSI and adjust as needed  --Monitor TPN labs on Mon/Thurs, dadily until stable  Cathaleen GORMAN Blanch, PharmD, BCPS 04/30/2024,7:52 AM

## 2024-04-30 NOTE — Progress Notes (Addendum)
 Primary nurse made aware that a PICC nurse isn't  here tonight and one will be working tomorrow. Primary nurse made aware to talk with pharmacy and the doctor about patient's TPN is no longer infusing due to patient pulling out the PICC line and see if they want to order D10. Primary nurse wanted writer to start two PIVs, but patient refused to have a second PIV placed, primary nurse made aware.

## 2024-04-30 NOTE — Progress Notes (Signed)
 [facility] SURGICAL PROGRESS NOTE  Hospital Day(s): 3.   Post op day(s): 3 Days Post-Op.   Interval History:  Patient continues to report abdominal soreness.  She denies passing any flatus.  There has been no recorded output.  She is on TPN. Vital signs in last 24 hours: [min-max] current  Temp:  [98.3 F (36.8 C)-99.3 F (37.4 C)] 98.3 F (36.8 C) (11/02 0753) Pulse Rate:  [90-112] 92 (11/02 0753) Resp:  [13-31] 18 (11/02 0753) BP: (118-161)/(51-112) 143/57 (11/02 0753) SpO2:  [96 %-100 %] 100 % (11/02 0753) Weight:  [82.7 kg] 82.7 kg (11/02 0500)     Height: 5' 10 (177.8 cm) Weight: 82.7 kg BMI (Calculated): 26.16   Intake/Output last 2 shifts:  11/01 0701 - 11/02 0700 In: 2481.1 [I.V.:2400; IV Piggyback:81.1] Out: 1078 [Urine:950; Emesis/NG output:80; Drains:48]   Physical Exam:  Constitutional: alert, cooperative and no distress  HEENT: NGT in place  Respiratory: breathing non-labored at rest Cardiovascular: regular rate and sinus rhythm  Gastrointestinal: Soft, incisional soreness, slightly distended this morning.  Surgical drain in left abdomen; output is serous today. Genitourinary; Foley in place Integumentary: Laparotomy is intact with staples, drainage inferiorly on honeycomb  Labs:     Latest Ref Rng & Units 04/30/2024    4:56 AM 04/29/2024    6:40 AM 04/28/2024    8:17 AM  CBC  WBC 4.0 - 10.5 K/uL 20.5  22.2  23.4   Hemoglobin 12.0 - 15.0 g/dL 7.2  8.3  88.7   Hematocrit 36.0 - 46.0 % 23.4  25.5  34.0   Platelets 150 - 400 K/uL 163  183  210       Latest Ref Rng & Units 04/30/2024    4:56 AM 04/29/2024    6:40 AM 04/28/2024    8:17 AM  CMP  Glucose 70 - 99 mg/dL 872  847  838   BUN 8 - 23 mg/dL 46  52  29   Creatinine 0.44 - 1.00 mg/dL 8.50  7.63  8.72   Sodium 135 - 145 mmol/L 143  140  143   Potassium 3.5 - 5.1 mmol/L 3.5  4.4  4.3   Chloride 98 - 111 mmol/L 109  104  106   CO2 22 - 32 mmol/L 27  23  22    Calcium  8.9 - 10.3 mg/dL  8.4  8.3  8.5      Imaging studies: Repeat KUB with dilated loops of bowel but air in the colon.   Assessment/Plan: 68 y.o. female 3 Days Post-Op s/p exploratory laparotomy with small bowel resection, ileocecectomy, ileocolonic anastomosis and reduction of internal hernia   - NG tube removed yesterday by patient and only a little bit had come out so was not replaced.  I did get a repeat KUB and there are some dilated loops of bowel but there looks to be like there is air in the colon.  She has very mild distention on exam.  I think we can hold off on placing an NG tube at this time.  However she needs to be on strict aspiration precautions and if she does have any episodes of emesis would replace the NG tube.  It is expected that she have a postoperative ileus given the degree of surgical intervention needed. - Continue TPN.  - Continue foley catheter for today; monitor and record UO - Continue IV Abx (Zosyn) - Monitor abdominal examination; on-going bowel function    - Monitor leukocytosis; leukocytosis slowly coming  down.  - Pain control prn; antiemetics prn - Needs to get out of bed and move around.  Recommend therapies engagement - Further management per primary service; we will follow     -- Jayson MALVA Endow

## 2024-05-01 DIAGNOSIS — K559 Vascular disorder of intestine, unspecified: Secondary | ICD-10-CM | POA: Diagnosis not present

## 2024-05-01 LAB — COMPREHENSIVE METABOLIC PANEL WITH GFR
ALT: 11 U/L (ref 0–44)
AST: 21 U/L (ref 15–41)
Albumin: 2.2 g/dL — ABNORMAL LOW (ref 3.5–5.0)
Alkaline Phosphatase: 44 U/L (ref 38–126)
Anion gap: 6 (ref 5–15)
BUN: 31 mg/dL — ABNORMAL HIGH (ref 8–23)
CO2: 24 mmol/L (ref 22–32)
Calcium: 7.9 mg/dL — ABNORMAL LOW (ref 8.9–10.3)
Chloride: 112 mmol/L — ABNORMAL HIGH (ref 98–111)
Creatinine, Ser: 1.03 mg/dL — ABNORMAL HIGH (ref 0.44–1.00)
GFR, Estimated: 58 mL/min — ABNORMAL LOW (ref >60)
Glucose, Bld: 146 mg/dL — ABNORMAL HIGH (ref 70–99)
Potassium: 3.8 mmol/L (ref 3.5–5.1)
Sodium: 142 mmol/L (ref 135–145)
Total Bilirubin: 0.4 mg/dL (ref 0.0–1.2)
Total Protein: 5.8 g/dL — ABNORMAL LOW (ref 6.5–8.1)

## 2024-05-01 LAB — CBC
HCT: 19.9 % — ABNORMAL LOW (ref 36.0–46.0)
Hemoglobin: 6.4 g/dL — ABNORMAL LOW (ref 12.0–15.0)
MCH: 31.8 pg (ref 26.0–34.0)
MCHC: 32.2 g/dL (ref 30.0–36.0)
MCV: 99 fL (ref 80.0–100.0)
Platelets: 168 K/uL (ref 150–400)
RBC: 2.01 MIL/uL — ABNORMAL LOW (ref 3.87–5.11)
RDW: 13.4 % (ref 11.5–15.5)
WBC: 15.2 K/uL — ABNORMAL HIGH (ref 4.0–10.5)
nRBC: 0 % (ref 0.0–0.2)

## 2024-05-01 LAB — SURGICAL PATHOLOGY

## 2024-05-01 LAB — GLUCOSE, CAPILLARY
Glucose-Capillary: 128 mg/dL — ABNORMAL HIGH (ref 70–99)
Glucose-Capillary: 132 mg/dL — ABNORMAL HIGH (ref 70–99)
Glucose-Capillary: 141 mg/dL — ABNORMAL HIGH (ref 70–99)
Glucose-Capillary: 145 mg/dL — ABNORMAL HIGH (ref 70–99)
Glucose-Capillary: 152 mg/dL — ABNORMAL HIGH (ref 70–99)

## 2024-05-01 LAB — HEMOGLOBIN: Hemoglobin: 8.2 g/dL — ABNORMAL LOW (ref 12.0–15.0)

## 2024-05-01 LAB — MAGNESIUM: Magnesium: 1.9 mg/dL (ref 1.7–2.4)

## 2024-05-01 LAB — PHOSPHORUS
Phosphorus: 2.1 mg/dL — ABNORMAL LOW (ref 2.5–4.6)
Phosphorus: 5.3 mg/dL — ABNORMAL HIGH (ref 2.5–4.6)
Phosphorus: 2.6 mg/dL (ref 2.5–4.6)

## 2024-05-01 LAB — TRIGLYCERIDES: Triglycerides: 111 mg/dL (ref <150)

## 2024-05-01 LAB — POTASSIUM
Potassium: 6.6 mmol/L (ref 3.5–5.1)
Potassium: 3.6 mmol/L (ref 3.5–5.1)

## 2024-05-01 LAB — PREPARE RBC (CROSSMATCH)

## 2024-05-01 MED ORDER — HYDRALAZINE HCL 20 MG/ML IJ SOLN
10.0000 mg | INTRAMUSCULAR | Status: DC | PRN
Start: 2024-05-01 — End: 2024-05-03
  Administered 2024-05-01 – 2024-05-02 (×2): 10 mg via INTRAVENOUS
  Filled 2024-05-01 (×2): qty 1

## 2024-05-01 MED ORDER — HYDROMORPHONE HCL 1 MG/ML IJ SOLN
0.6000 mg | INTRAMUSCULAR | Status: DC | PRN
  Administered 2024-05-01 – 2024-05-09 (×25): 0.6 mg via INTRAVENOUS
  Filled 2024-05-01 (×26): qty 1

## 2024-05-01 MED ORDER — HYDROMORPHONE HCL 1 MG/ML IJ SOLN
0.4000 mg | INTRAMUSCULAR | Status: DC | PRN
  Administered 2024-05-04: 0.4 mg via INTRAVENOUS
  Filled 2024-05-01 (×2): qty 0.5

## 2024-05-01 MED ORDER — SODIUM CHLORIDE 0.9% IV SOLUTION: Freq: Once | INTRAVENOUS | Status: AC

## 2024-05-01 MED ORDER — INSULIN ASPART 100 UNIT/ML IJ SOLN
0.0000 [IU] | Freq: Four times a day (QID) | INTRAMUSCULAR | Status: DC
  Administered 2024-05-02 – 2024-05-11 (×6): 1 [IU] via SUBCUTANEOUS
  Filled 2024-05-01: qty 2
  Filled 2024-05-01 (×4): qty 1
  Filled 2024-05-01: qty 2
  Filled 2024-05-01: qty 1

## 2024-05-01 MED ORDER — TRACE MINERALS CU-MN-SE-ZN 300-55-60-3000 MCG/ML IV SOLN
INTRAVENOUS | Status: AC
  Filled 2024-05-01: qty 720

## 2024-05-01 MED ORDER — POTASSIUM PHOSPHATES 15 MMOLE/5ML IV SOLN
15.0000 mmol | Freq: Once | INTRAVENOUS | Status: AC
  Administered 2024-05-01: 15 mmol via INTRAVENOUS
  Filled 2024-05-01: qty 5

## 2024-05-01 MED ORDER — THIAMINE HCL 100 MG/ML IJ SOLN
100.0000 mg | Freq: Every day | INTRAMUSCULAR | Status: AC
  Administered 2024-05-01 – 2024-05-07 (×7): 100 mg via INTRAVENOUS
  Filled 2024-05-01 (×7): qty 2

## 2024-05-01 NOTE — Progress Notes (Signed)
 PROGRESS NOTE    Kylie [name]  FMW:[id number] DOB: 1955-12-17 DOA: 04/27/2024 PCP: [facility]  Chief Complaint  Patient presents with   Hypertension   Abdominal Pain    Hospital Course:  Kylie [name] is a 68 y.o. female with medical history significant of dementia, HTN, HLD, sCHF with EF 40-45%, CVA, CKD-3a, depression, DM, PE on Xarelto , s/p right nephrectomy in 2009 for right renal cell carcinoma presented with abdominal pain.  Poor historian due to dementia.  Workup revealed leukocytosis, lactic acidosis, imaging shows ischemic bowel disease, s/p exploratory laparotomy and small bowel resection with cecectomy on 10/30. Hospital course as below  Subjective: Patient was examined at bedside, states she feels better today.  C/o mild abdominal pain  Called [name] Cousin ([facility]), provided updates and answered all questions   Objective: Vitals:   05/01/24 0408 05/01/24 0500 05/01/24 0911 05/01/24 1149  BP: (!) 162/69  (!) 165/67 (!) 166/81  Pulse: 86  83 87  Resp: 16  16 18   Temp: 98 F (36.7 C)  98.4 F (36.9 C) 98.4 F (36.9 C)  TempSrc: Oral     SpO2: 100%  100% 100%  Weight:  83.5 kg    Height:        Intake/Output Summary (Last 24 hours) at 05/01/2024 1420 Last data filed at 05/01/2024 0900 Gross per 24 hour  Intake 1827.04 ml  Output 1865 ml  Net -37.96 ml   Filed Weights   04/29/24 0500 04/30/24 0500 05/01/24 0500  Weight: 78.1 kg 82.7 kg 83.5 kg    Examination: General: alert, no acute distress Cardiac: RRR, No murmurs, No gallops or rubs Respiratory: No rales, wheezing, rhonchi or rubs GI: mild tenderness consistent post op Ext: No pitting leg edema bilaterally. 1+DP/PT pulse bilaterally. Musculoskeletal: No joint deformities, No joint redness or warmth, no limitation of ROM in spin. Skin: No rashes.  Neuro: awake, alert, baseline confused, no gross focal deficits  Assessment & Plan:  Ischemic bowel disease and closed loop obstruction of  intestine (HCC): - S/p exploratory laparotomy, reduction of internal hernia, small bowel resection with cecectomy (110 cm), ileocolic anastomosis -on 10/30 - NPO, IV fluids. NG tube out by patient, hold off on placing NG tube - Continue IV Zosyn, leukocytosis slowly downtrending - Has PICC line, TPN, due to postop ileus - Surgery following, appreciate recs - Pain management, antiemetics prn  Hypertensive urgency - resolved HTN - On Lasix , Cozaar , clonidine  patch, amlodipine  at home - Hold all po meds - IV hydralazine  prn with holding parameters  AKI on CKD stage 3a - Cr inc 2.36 -> 1.49 -> 1.03, baseline Cr 1.0-1.3 - On IV fluids 40 cc/hr - Monitor UO, Cr  Hypokalemia Hypophosphatemia - Mag normal - Monitor and replete as needed, pharmacy consulted  Acute drop in Hb Normocytic anemia - Hb dropped from 12.6 -> 8.3 -> 7.2 -> 6.4, reported one episode of black BM - Anticoagulation held, transfuse 1u pRBC - Monitor Hb and transfuse if < 7   History of pulmonary embolism - Hold Eliquis, resume when ok per Surgery - Received 4000 units of Kcentra for reversal on 10/30   Chronic combined systolic and diastolic CHF (congestive heart failure) (HCC): 2D echo on 10/08/2023 showed EF of 40-45% with grade 2 diastolic dysfunction.  Patient does not have leg edema or JVD.  CHF seems to be concentrated. - Hold Lasix , BNP not elevated - NPO, on IV fluids   Acquired hypothyroidism - On Levothyroxine  100mcg, start on IV  Levothyroxine    CVA (cerebral vascular accident) (HCC) - Hold Crestor , Eliquis   CAD (coronary artery disease): Troponin negative x 2 -Hold Crestor  now   HLD (hyperlipidemia) -Hold Crestor    Depression -Hold Cymbalta  and olanzapine  due to QTc prolongation and npo  Dementia - Delirium precautions - Vit B12 1000 mcg IM once, po supplements once able to tolerate  -- PT/OT rec SNF -- Has foleys catheter - remove and do TOV  DVT prophylaxis: SCD's   Code  Status: Full Code Disposition:  TBD  Consultants:  Treatment Team:  Consulting Physician: Desiderio Schanz, MD  Procedures:  exploratory laparotomy, reduction of internal hernia, small bowel resection with cecectomy (110 cm), ileocolic anastomosis -on 10/30  Antimicrobials:  Anti-infectives (From admission, onward)    Start     Dose/Rate Route Frequency Ordered Stop   04/28/24 0600  piperacillin-tazobactam (ZOSYN) IVPB 3.375 g        3.375 g 12.5 mL/hr over 240 Minutes Intravenous Every 8 hours 04/28/24 0256     04/27/24 2300  cefoTEtan (CEFOTAN) 2 g in sodium chloride  0.9 % 100 mL IVPB        2 g 200 mL/hr over 30 Minutes Intravenous On call to O.R. 04/27/24 2255 04/28/24 1319       Data Reviewed: I have personally reviewed following labs and imaging studies CBC: Recent Labs  Lab 04/27/24 1834 04/28/24 0817 04/29/24 0640 04/30/24 0456 05/01/24 0432  WBC 12.4* 23.4* 22.2* 20.5* 15.2*  NEUTROABS 10.5*  --   --   --   --   HGB 12.6 11.2* 8.3* 7.2* 6.4*  HCT 38.7 34.0* 25.5* 23.4* 19.9*  MCV 95.8 96.3 98.5 101.7* 99.0  PLT 266 210 183 163 168   Basic Metabolic Panel: Recent Labs  Lab 04/27/24 1834 04/28/24 0817 04/29/24 0640 04/30/24 0456 05/01/24 0432  NA 138 143 140 143 142  K 3.8 4.3 4.4 3.5 3.8  CL 100 106 104 109 112*  CO2 23 22 23 27 24   GLUCOSE 238* 161* 152* 127* 146*  BUN 29* 29* 52* 46* 31*  CREATININE 1.16* 1.27* 2.36* 1.49* 1.03*  CALCIUM  9.6 8.5* 8.3* 8.4* 7.9*  MG 1.9  --  1.9 2.0 1.9  PHOS  --   --  4.9* 2.3* 2.1*   GFR: Estimated Creatinine Clearance: 59.8 mL/min (A) (by C-G formula based on SCr of 1.03 mg/dL (H)). Liver Function Tests: Recent Labs  Lab 04/27/24 1834 05/01/24 0432  AST 27 21  ALT 21 11  ALKPHOS 85 44  BILITOT 1.1 0.4  PROT 8.2* 5.8*  ALBUMIN 4.0 2.2*   CBG: Recent Labs  Lab 04/30/24 2029 05/01/24 0001 05/01/24 0408 05/01/24 0807 05/01/24 1146  GLUCAP 155* 141* 152* 128* 145*    Recent Results (from the past  240 hours)  MRSA Next Gen by PCR, Nasal     Status: None   Collection Time: 04/28/24  4:14 AM   Specimen: Nasal Mucosa; Nasal Swab  Result Value Ref Range Status   MRSA by PCR Next Gen NOT DETECTED NOT DETECTED Final    Comment: (NOTE) The GeneXpert MRSA Assay (FDA approved for NASAL specimens only), is one component of a comprehensive MRSA colonization surveillance program. It is not intended to diagnose MRSA infection nor to guide or monitor treatment for MRSA infections. Test performance is not FDA approved in patients less than 50 years old. Performed at [facility], [address] [place], [place] [address]      Radiology Studies:  DG Abd 1 View Result Date: 04/30/2024 EXAM: 1 VIEW XRAY OF THE ABDOMEN 04/30/2024 09:20:22 AM CLINICAL HISTORY: Ileus (HCC) COMPARISON: None available. FINDINGS: LINES, TUBES AND DEVICES: Surgical drain and skin staples seen in the pelvis, right quadrant surgical clips and embolization coil again seen, IVC filter again noted. Nasogastric tube has been removed. BOWEL: Mildly dilated small bowel loops are seen in the left abdomen with gas also seen within nondilated colon. This likely represents a postoperative ileus. SOFT TISSUES: No opaque urinary calculi. BONES: No acute osseous abnormality. IMPRESSION: 1. Mildly dilated small bowel loops in the left abdomen, likely representing postoperative ileus. Electronically signed by: Norleen Kil MD 04/30/2024 09:26 AM EST RP Workstation: [id number]   US  EKG SITE RITE Result Date: 04/30/2024 If Site Rite image not attached, placement could not be confirmed due to current cardiac rhythm.   Scheduled Meds:  Chlorhexidine  Gluconate Cloth  6 each Topical Daily   insulin  aspart  0-6 Units Subcutaneous Q6H   [START ON 05/05/2024] levothyroxine   75 mcg Intravenous Daily   sodium chloride  flush  10-40 mL Intracatheter Q12H   thiamine (VITAMIN B1) injection  100 mg Intravenous Daily   Continuous Infusions:   dextrose  5% lactated ringers  40 mL/hr at 05/01/24 1110   piperacillin-tazobactam (ZOSYN)  IV 3.375 g (05/01/24 0517)   potassium PHOSPHATE IVPB (in mmol) 15 mmol (05/01/24 1116)   TPN ADULT (ION) 75 mL/hr at 04/30/24 1750   TPN ADULT (ION)       LOS: 4 days  MDM: Patient is high risk for one or more organ failure.  They necessitate ongoing hospitalization for continued IV therapies and subsequent lab monitoring. Total time spent interpreting labs and vitals, reviewing the medical record, coordinating care amongst consultants and care team members, directly assessing and discussing care with the patient and/or family: 55 min Kylie Lock, MD [facility]  To contact the attending physician between 7A-7P please use Epic Chat. To contact the covering physician during after hours 7P-7A, please review Amion.  05/01/2024, 2:20 PM   *This document has been created with the assistance of dictation software. Please excuse typographical errors. *

## 2024-05-01 NOTE — Progress Notes (Signed)
 Nutrition Follow-up  DOCUMENTATION CODES:   Non-severe (moderate) malnutrition in context of chronic illness  INTERVENTION:   -Continue TPN management per pharmacy -Monitor Mg, K, and Phos and replete as needed secondary to high refeeding risk -Continue daily weights -Recommend 100 mg thiamine daily x 7 days -RD will monitor for ability to transition to PO diet vs enteral nutrition  NUTRITION DIAGNOSIS:   Moderate Malnutrition related to chronic illness as evidenced by moderate fat depletion, moderate muscle depletion.  Ongoing  GOAL:   Patient will meet greater than or equal to 90% of their needs  Met with TPN  MONITOR:   Diet advancement, Labs, Weight trends, Skin, I & O's, Other (Comment) (TPN)  REASON FOR ASSESSMENT:   Consult New TPN/TNA  ASSESSMENT:   68 y.o. female with h/o dementia, HTN, HLD, CHF, CVA, CKD-3a, depression, DM, PE on Xarelto , RCC s/p right nephrectomy (2009), hypothryoidism, CAD and OSA who is admitted with SBO secondary to Internal hernia resulting in small bowel ischemia and necrosis now s/p exploratory laparotomy, reduction of internal hernia, small bowel resection with cecectomy (~110cm ileum along with IC valve) and ileocolic anastomosis 10/31.  10/31- s/p s/p exploratory laparotomy with small bowel resection, ileocecectomy, ileocolonic anastomosis and reduction of internal hernia, PICC placed, TPN initiated 11/1- NGT removed by patient, pulled out PICC line 11/2- PICC line replaced  Reviewed I/O's: +1.4 L x 24 hours and +5.5 L since admission  UOP: 1.2 L x 24 hours  Patient pulled out NGT on 04/29/24. Per MD, ok top continue to leave out, but may need to consider replacing if there is increased distention or emesis. Per general surgery notes, plan to continue NPO and awaiting for bowel function.   Patient lying in bed at time of visit. She did not respond to name being called. Safety sitter at bedside.   Patient remains on TPN for  nutritional support- currently receiving at 75 ml/hr, which provides 2005 kcals and 108 grams protein, which meets 100% of estimated calorie and protein needs.   No weight loss since admission.   Patient remains at high refeeding risk. Noted thiamine not ordered. Phos supplemented today via IV. Reached out to pharmacist via secure chat to discuss thiamine supplementation and patient's nutritional needs.   Medications reviewed and include dextrose  5% in lactated ringers  infusion @ 40 ml/hr.   Labs reviewed: Phos: 2.1 (on IV supplementation), K and Mg WDL. CBGS: 122-155 (inpatient orders for glycemic control are 0-6 units insulin  aspart every 4 hours). Vitamin B-12 WDL.   Diet Order:   Diet Order             Diet NPO time specified Except for: Sips with Meds  Diet effective now                   EDUCATION NEEDS:   Not appropriate for education at this time  Skin:  Skin Assessment: Skin Integrity Issues: Skin Integrity Issues:: Incisions Incisions: closed abdomen  Last BM:  05/01/24 (type 7)  Height:   Ht Readings from Last 1 Encounters:  04/28/24 5' 10 (1.778 m)    Weight:   Wt Readings from Last 1 Encounters:  05/01/24 83.5 kg    Ideal Body Weight:  68.1 kg  BMI:  Body mass index is 26.41 kg/m.  Estimated Nutritional Needs:   Kcal:  2000-2300kcal/day  Protein:  100-115g/day  Fluid:  1.8-2.1L/day    Margery ORN, RD, LDN, CDCES Registered Dietitian III Certified Diabetes Care and Education Specialist  If unable to reach this RD, please use RD Inpatient group chat on secure chat between hours of 8am-4 pm daily

## 2024-05-01 NOTE — Progress Notes (Signed)
 PT Cancellation Note  Patient Details Name: Kylie [name] MRN: [id number] DOB: 1956-02-10   Cancelled Treatment:     Pt admitted with Hgb 12.6 three days ago, currently down at 6.4  Pt awaiting transfusion, symptomatic sitting EOB on 11/1. Will hold PT session at this time and re-assess.    Darice JAYSON Bohr 05/01/2024, 9:49 AM

## 2024-05-01 NOTE — Progress Notes (Signed)
 PHARMACY - TOTAL PARENTERAL NUTRITION CONSULT NOTE   Indication: Small bowel obstruction  Patient Measurements: Height: 5' 10 (177.8 cm) Weight: 83.5 kg (184 lb 1.4 oz) IBW/kg (Calculated) : 68.5 TPN AdjBW (KG): 79.6 Body mass index is 26.41 kg/m. Usual Weight: 75.8 kg (02/07/24)  Assessment:  Kylie [name] is a 68yoF that presented with upper abdominal pain. Patient's past medical history notable for dementia, HTN, HLD, sCHF with EF 40-45% (10/08/23), CVA, CKD-3a, depression, DM, PE on apixaban, and s/p right nephrectomy in 2009 for right renal cell carcinoma.  Glucose / Insulin :  BG 122-155. Novolog  in the last 24 hours : 1 units.  Electrolytes:  Phos 4.9 > 2.3 > 2.1 Correctd Ca 9.3   (Ca 7.9,  Alb 2.2) Renal:  Scr 1.27 > 2.36 > 1.49> 1.03 (BL ~0.9-1) Hepatic: 11/4 labs  Alk phos 44, AST 21, ALT 11, total bili 0.4, albumin 2.2 Intake / Output; MIVF:  +4.1 L (10/30 1900 to 10/31 0700) GI Imaging: 10/30 CT Angio Impression: Closed loop small bowel obstruction likely secondary to an internal hernia with findings concerning for developing bowel ischemia. Clinical correlation and surgical consult is advised. Status post right nephrectomy GI Surgeries / Procedures:  10/31: Exploratory Laparotomy, reduction of internal hernia, small bowel resection with cecectomy (length 110 cm), ileocolic anastomosis   Central access: no TPN start date: 04/28/2024  Nutritional Goals: Goal TPN rate is 75 mL/hr (provides 108 g of protein and 2005 kcals per day)  RD Assessment: Estimated Needs Total Energy Estimated Needs: 2000-2300kcal/day Total Protein Estimated Needs: 100-115g/day Total Fluid Estimated Needs: 1.8-2.1L/day  Current Nutrition:  NPO sips w/ meds  Plan:  ---Continue TPN at 75 ml/hr (goal rate) at 1800. Hx of HF will keep fluid low.   - Will order IV kphos 15 mmol x 1.  --recheck Phos/K at 1800 ---Electrolytes in TPN (standard): Na 50mEq/L, K 24mEq/L, Ca 64mEq/L, Mg  81mEq/L, and Phos 15mmol/L. Cl:Ac 1:1 ---Add standard MVI and trace elements to TPN --add thiamine 100mg  IV daily x 7 days per dietician (start 11/3)- outside TPN ---adjust very Sensitive q4h to q6h SSI and adjust as needed  --Monitor TPN labs on Mon/Thurs, daily until stable  Allean Haas PharmD Clinical Pharmacist 05/01/2024

## 2024-05-01 NOTE — Progress Notes (Signed)
 OT Cancellation Note  Patient Details Name: Kylie [name] MRN: [id number] DOB: 29-Dec-1955   Cancelled Treatment:    Reason Eval/Treat Not Completed: Other (comment). Pt with Hgb of 6.4 pending blood transfusion at this time. Will cont to follow POC and see as appropriate.  Ernesteen Mihalic E Atlas Kuc 05/01/2024, 9:52 AM

## 2024-05-01 NOTE — Progress Notes (Signed)
 [facility] SURGICAL PROGRESS NOTE  Hospital Day(s): 4.   Post op day(s): 4 Days Post-Op.   Interval History:  Patient seen and examined the same morning of surgery  She is laying in bed; NAD Arouses appropriately; safety sitter present  No fever, chills, emesis Leukocytosis improving; WBC 15.2K Hgb to 6.4; suspect dilution Renal function seems to be at baseline; sCr - 1.03; UO - 1175 ccs No electrolyte derangements Surgical; drain with 40 ccs out; serous NPO + TPN No reported bowel function  She is on Zosyn  Vital signs in last 24 hours: [min-max] current  Temp:  [98 F (36.7 C)-98.9 F (37.2 C)] 98 F (36.7 C) (11/03 0408) Pulse Rate:  [86-88] 86 (11/03 0408) Resp:  [15-17] 16 (11/03 0408) BP: (151-162)/(65-69) 162/69 (11/03 0408) SpO2:  [100 %] 100 % (11/03 0408) Weight:  [83.5 kg] 83.5 kg (11/03 0500)     Height: 5' 10 (177.8 cm) Weight: 83.5 kg BMI (Calculated): 26.41   Intake/Output last 2 shifts:  11/02 0701 - 11/03 0700 In: 2580 [I.V.:2190.7; IV Piggyback:389.3] Out: 1215 [Urine:1175; Drains:40]   Physical Exam:  Constitutional: sleeping, arouses; NAD - Safety sitter at bedside Respiratory: breathing non-labored at rest Cardiovascular: regular rate and sinus rhythm  Gastrointestinal: Soft, non-tender, she does not appear overtly distended. Surgical drain in left abdomen; output serous Genitourinary; Foley in place Integumentary: Laparotomy is intact with staples, drainage inferiorly on honeycomb  Labs:     Latest Ref Rng & Units 05/01/2024    4:32 AM 04/30/2024    4:56 AM 04/29/2024    6:40 AM  CBC  WBC 4.0 - 10.5 K/uL 15.2  20.5  22.2   Hemoglobin 12.0 - 15.0 g/dL 6.4  7.2  8.3   Hematocrit 36.0 - 46.0 % 19.9  23.4  25.5   Platelets 150 - 400 K/uL 168  163  183       Latest Ref Rng & Units 05/01/2024    4:32 AM 04/30/2024    4:56 AM 04/29/2024    6:40 AM  CMP  Glucose 70 - 99 mg/dL 853  872  847   BUN 8 - 23 mg/dL 31  46  52    Creatinine 0.44 - 1.00 mg/dL 8.96  8.50  7.63   Sodium 135 - 145 mmol/L 142  143  140   Potassium 3.5 - 5.1 mmol/L 3.8  3.5  4.4   Chloride 98 - 111 mmol/L 112  109  104   CO2 22 - 32 mmol/L 24  27  23    Calcium  8.9 - 10.3 mg/dL 7.9  8.4  8.3   Total Protein 6.5 - 8.1 g/dL 5.8     Total Bilirubin 0.0 - 1.2 mg/dL 0.4     Alkaline Phos 38 - 126 U/L 44     AST 15 - 41 U/L 21     ALT 0 - 44 U/L 11        Imaging studies: No new pertinent imaging studies   Assessment/Plan: 68 y.o. female 4 Days Post-Op s/p exploratory laparotomy with small bowel resection, ileocecectomy, ileocolonic anastomosis and reduction of internal hernia   - We will continue NPO for now; Awaiting objective bowel function   - Continue TPN; Monitor electrolytes  - Monitor H&H; okay to transfuse given <7 - Continue IV Abx (Zosyn) - Monitor abdominal examination; on-going bowel function    - Monitor leukocytosis; likely reactive; improved  - Pain control prn; antiemetics prn - Mobilize; okay to  work with therapies  - Further management per primary service; we will follow    All of the above findings and recommendations were discussed with the patient, and the medical team, and all of patient's questions were answered to her expressed satisfaction.  -- Arthea Platt, PA-C [facility] 05/01/2024, 8:01 AM M-F: 7am - 4pm

## 2024-05-02 DIAGNOSIS — K559 Vascular disorder of intestine, unspecified: Secondary | ICD-10-CM | POA: Diagnosis not present

## 2024-05-02 LAB — CBC
HCT: 26.4 % — ABNORMAL LOW (ref 36.0–46.0)
Hemoglobin: 8.6 g/dL — ABNORMAL LOW (ref 12.0–15.0)
MCH: 30.9 pg (ref 26.0–34.0)
MCHC: 32.6 g/dL (ref 30.0–36.0)
MCV: 95 fL (ref 80.0–100.0)
Platelets: 211 K/uL (ref 150–400)
RBC: 2.78 MIL/uL — ABNORMAL LOW (ref 3.87–5.11)
RDW: 14.6 % (ref 11.5–15.5)
WBC: 13.2 K/uL — ABNORMAL HIGH (ref 4.0–10.5)
nRBC: 0 % (ref 0.0–0.2)

## 2024-05-02 LAB — MAGNESIUM: Magnesium: 1.9 mg/dL (ref 1.7–2.4)

## 2024-05-02 LAB — TYPE AND SCREEN
ABO/RH(D): B POS
Antibody Screen: NEGATIVE
Unit division: 0

## 2024-05-02 LAB — BPAM RBC
Blood Product Expiration Date: 202511202359
ISSUE DATE / TIME: 202511031356
Unit Type and Rh: 7300

## 2024-05-02 LAB — GLUCOSE, CAPILLARY
Glucose-Capillary: 143 mg/dL — ABNORMAL HIGH (ref 70–99)
Glucose-Capillary: 149 mg/dL — ABNORMAL HIGH (ref 70–99)
Glucose-Capillary: 150 mg/dL — ABNORMAL HIGH (ref 70–99)
Glucose-Capillary: 159 mg/dL — ABNORMAL HIGH (ref 70–99)

## 2024-05-02 LAB — BASIC METABOLIC PANEL WITH GFR
Anion gap: 11 (ref 5–15)
BUN: 22 mg/dL (ref 8–23)
CO2: 24 mmol/L (ref 22–32)
Calcium: 8.6 mg/dL — ABNORMAL LOW (ref 8.9–10.3)
Chloride: 106 mmol/L (ref 98–111)
Creatinine, Ser: 0.81 mg/dL (ref 0.44–1.00)
GFR, Estimated: >60 mL/min (ref >60)
Glucose, Bld: 149 mg/dL — ABNORMAL HIGH (ref 70–99)
Potassium: 3.6 mmol/L (ref 3.5–5.1)
Sodium: 141 mmol/L (ref 135–145)

## 2024-05-02 LAB — PHOSPHORUS: Phosphorus: 2.4 mg/dL — ABNORMAL LOW (ref 2.5–4.6)

## 2024-05-02 MED ORDER — POTASSIUM PHOSPHATES 15 MMOLE/5ML IV SOLN
15.0000 mmol | Freq: Once | INTRAVENOUS | Status: AC
  Administered 2024-05-02: 15 mmol via INTRAVENOUS
  Filled 2024-05-02: qty 5

## 2024-05-02 MED ORDER — TRACE MINERALS CU-MN-SE-ZN 300-55-60-3000 MCG/ML IV SOLN
INTRAVENOUS | Status: AC
  Filled 2024-05-02: qty 720

## 2024-05-02 MED ORDER — AMLODIPINE BESYLATE 10 MG PO TABS
10.0000 mg | ORAL_TABLET | Freq: Every day | ORAL | Status: DC
  Administered 2024-05-02 – 2024-05-18 (×17): 10 mg via ORAL
  Filled 2024-05-02 (×17): qty 1

## 2024-05-02 MED ORDER — LEVOTHYROXINE SODIUM 50 MCG PO TABS
100.0000 ug | ORAL_TABLET | Freq: Every morning | ORAL | Status: DC
  Administered 2024-05-03 – 2024-05-19 (×16): 100 ug via ORAL
  Filled 2024-05-02 (×17): qty 2

## 2024-05-02 NOTE — Progress Notes (Signed)
 Physical Therapy Treatment Patient Details Name: Galileah Piggee MRN: [id number] DOB: 1956-01-04 Today's Date: 05/02/2024   History of Present Illness Pt is a 68 y/o F admitted on 04/27/24 after presenting with c/o abdominal pain. Imaging showed ischemic bowel disease. Pt is s/p ex lap & small bowel resection with cecectomy on 04/27/24. PMH: dementia, HTN, HLD, sCHF, CVA, CKD 3A, depression, DM, PE on xarelto , s/p R nephrectomy (2009) 2/2 renal cell carcinoma    PT Comments  Pt received in bed for co-tx with OT for safe progressive mobility and management of multiple lines/leads. Pt with c/o abdominal discomfort, but agreeable to session. Pre-medicated prior. Repeated cues throughout session for proper transfer technique and completion of desired tasks requiring increased processing time. Overall, pt very compliant, tolerating OOB>BSC and short distance gait in room with RW 20+ft. Pt positioned to comfort in chair with all needs in reach. Good tolerance for activity this date. Will continue to progress per POC.   If plan is discharge home, recommend the following: A lot of help with walking and/or transfers;A lot of help with bathing/dressing/bathroom   Can travel by private vehicle     No  Equipment Recommendations  None recommended by PT (? lives at [facility])    Recommendations for Other Services       Precautions / Restrictions Precautions Precautions: Fall Recall of Precautions/Restrictions: Impaired Precaution/Restrictions Comments: TPN, LLQ JP drain, log roll for comfort 2/2 abdominal incision Restrictions Weight Bearing Restrictions Per Provider Order: No     Mobility  Bed Mobility Overal bed mobility: Needs Assistance Bed Mobility: Sidelying to Sit, Rolling Rolling: Min assist, Used rails Sidelying to sit: Mod assist       General bed mobility comments:  (Continuous cues for proper technique)    Transfers Overall transfer level: Needs assistance Equipment used: Rolling  [place] (2 wheels) Transfers: Sit to/from Stand Sit to Stand: Min assist           General transfer comment:  (MinA of 2 for transfer and equipment management)    Ambulation/Gait Ambulation/Gait assistance: Min assist, +2 safety/equipment Gait Distance (Feet):  (20) Assistive device: Rolling Shedlock (2 wheels) Gait Pattern/deviations: Decreased step length - right, Decreased step length - left, Decreased stride length Gait velocity: decreased     General Gait Details: R knee pain, significant Bilateral knee varus. Fairly steady with support of RW. Remains high fall risk   Stairs             Wheelchair Mobility     Tilt Bed    Modified Rankin (Stroke Patients Only)       Balance Overall balance assessment: Needs assistance Sitting-balance support: Feet supported, Bilateral upper extremity supported Sitting balance-Leahy Scale: Fair     Standing balance support: Bilateral upper extremity supported, During functional activity, Reliant on assistive device for balance Standing balance-Leahy Scale: Fair Standing balance comment: RW use and Min A for safety                            Communication Communication Communication: Impaired Factors Affecting Communication: Reduced clarity of speech  Cognition Arousal: Alert Behavior During Therapy: WFL for tasks assessed/performed   PT - Cognitive impairments: History of cognitive impairments                       PT - Cognition Comments: Per chart, pt with hx of dementia. Following commands: Impaired Following commands impaired: Follows one step  commands inconsistently, Only follows one step commands consistently    Cueing Cueing Techniques: Verbal cues, Tactile cues, Visual cues  Exercises      General Comments General comments (skin integrity, edema, etc.):  (Abd bandage intact without noted drainage)      Pertinent Vitals/Pain Pain Assessment Pain Assessment: Faces Faces Pain Scale:  Hurts even more Pain Location: abdominal incision Pain Descriptors / Indicators: Grimacing, Guarding, Moaning Pain Intervention(s): Limited activity within patient's tolerance    Home Living                          Prior Function            PT Goals (current goals can now be found in the care plan section)      Frequency    Min 2X/week      PT Plan      Co-evaluation   Reason for Co-Treatment: For patient/therapist safety;Necessary to address cognition/behavior during functional activity PT goals addressed during session: Mobility/safety with mobility;Balance;Proper use of DME OT goals addressed during session: ADL's and self-care      AM-PAC PT 6 Clicks Mobility   Outcome Measure  Help needed turning from your back to your side while in a flat bed without using bedrails?: A Lot Help needed moving from lying on your back to sitting on the side of a flat bed without using bedrails?: A Lot Help needed moving to and from a bed to a chair (including a wheelchair)?: A Little Help needed standing up from a chair using your arms (e.g., wheelchair or bedside chair)?: A Little Help needed to walk in hospital room?: A Lot Help needed climbing 3-5 steps with a railing? : Total 6 Click Score: 13    End of Session Equipment Utilized During Treatment: Gait belt Activity Tolerance: Patient tolerated treatment well Patient left: in chair;with call bell/phone within reach;with chair alarm set;with nursing/sitter in room Nurse Communication: Mobility status PT Visit Diagnosis: Muscle weakness (generalized) (M62.81);Unsteadiness on feet (R26.81);Pain Pain - part of body:  (Abdomen)     Time: 8440-8374 PT Time Calculation (min) (ACUTE ONLY): 26 min  Charges:    $Therapeutic Activity: 8-22 mins PT General Charges $$ ACUTE PT VISIT: 1 Visit                    Darice Bohr, PTA  Darice JAYSON Bohr 05/02/2024, 4:50 PM

## 2024-05-02 NOTE — Progress Notes (Signed)
 Covered as a comptroller from 1915 to now, Environmental Consultant at bedside.

## 2024-05-02 NOTE — Progress Notes (Signed)
 Writer attempted to return the call of Weston. Unable to leave a message as mailbox is full and not accepting messages at this time.  Unable to verify of Odella knows the password will have to verify the password if she calls back, if note no information can be given as [name] the cousin is the POA.

## 2024-05-02 NOTE — TOC Progression Note (Signed)
 Transition of Care [facility]) - Progression Note    Patient Details  Name: [name] MRN: [id number] Date of Birth: 02-20-1956  Transition of Care [facility]) CM/SW Contact  Alvaro Louder, [place] Phone Number: 05/02/2024, 2:53 PM  Clinical Narrative:   Patient is not medically stable. Will return to [facility] when medically ready.   TOC to follow for discharge                       Expected Discharge Plan and Services                                               Social Drivers of Health (SDOH) Interventions SDOH Screenings   Food Insecurity: Patient Unable To Answer (04/28/2024)  Housing: Unknown (04/28/2024)  Transportation Needs: Patient Unable To Answer (04/28/2024)  Utilities: Patient Unable To Answer (04/28/2024)  Financial Resource Strain: Low Risk  (09/05/2020)   Received from [facility]  Physical Activity: Inactive (03/04/2021)   Received from [facility]  Social Connections: Patient Unable To Answer (04/28/2024)  Tobacco Use: Low Risk  (01/05/2024)   Received from [facility]  Health Literacy: High Risk (01/21/2021)   Received from [facility]    Readmission Risk Interventions     No data to display

## 2024-05-02 NOTE — Progress Notes (Signed)
 Occupational Therapy Treatment Patient Details Name: Kylie [name] MRN: [id number] DOB: September 26, 1955 Today's Date: 05/02/2024   History of present illness Pt is a 68 y/o F admitted on 04/27/24 after presenting with c/o abdominal pain. Imaging showed ischemic bowel disease. Pt is s/p ex lap & small bowel resection with cecectomy on 04/27/24. PMH: dementia, HTN, HLD, sCHF, CVA, CKD 3A, depression, DM, PE on xarelto , s/p R nephrectomy (2009) 2/2 renal cell carcinoma   OT comments  Pt is supine in bed on arrival. Agreeable to OT/PT co-treatment session to maximize pt/therapist safety. She reports a lot of abdominal pain, noted to be grinding teeth and moaning throughout session. She requires step by step multi modal cues for one step direction following throughout session during ADL performance and ambulation. Min A X2 for safety with log roll to EOB, STS and SPT to [facility]. CGA for standing peri-care with cues and standing hand hygiene at sink. Pt ambulated within the room using RW with Min A X2 and was left seated in recliner with sitter present. She will cont to require skilled acute OT services to maximize his safety and IND to return to PLOF.       If plan is discharge home, recommend the following:  A lot of help with walking and/or transfers;A lot of help with bathing/dressing/bathroom;Supervision due to cognitive status   Equipment Recommendations  Other (comment) (defer to next venue)    Recommendations for Other Services      Precautions / Restrictions Precautions Precautions: Fall Recall of Precautions/Restrictions: Impaired Precaution/Restrictions Comments: NG tube, LLQ JP drain, log roll for comfort 2/2 abdominal incision Restrictions Weight Bearing Restrictions Per Provider Order: No       Mobility Bed Mobility Overal bed mobility: Needs Assistance Bed Mobility: Rolling, Sidelying to Sit Rolling: Min assist Sidelying to sit: Min assist       General bed mobility comments:  constant multimodal cueing for log roll technique    Transfers Overall transfer level: Needs assistance Equipment used: Rolling Redlich (2 wheels) Transfers: Sit to/from Stand Sit to Stand: Min assist           General transfer comment: Min A to stand from EOB using RW with step by step cues for one direction following     Balance Overall balance assessment: Needs assistance Sitting-balance support: Feet supported, Bilateral upper extremity supported Sitting balance-Leahy Scale: Fair     Standing balance support: Bilateral upper extremity supported, During functional activity, Reliant on assistive device for balance Standing balance-Leahy Scale: Fair Standing balance comment: RW use and Min A for safety                           ADL either performed or assessed with clinical judgement   ADL Overall ADL's : Needs assistance/impaired     Grooming: Standing;Wash/dry hands;Contact guard assist;Minimal assistance Grooming Details (indicate cue type and reason): cues for sequencing/task performance                 Toilet Transfer: Minimal assistance;Rolling Christen (2 wheels);BSC/3in1;Stand-pivot Toilet Transfer Details (indicate cue type and reason): ADL transfer bed>BSC with Min A x2, cues for safety and sequencing Toileting- Clothing Manipulation and Hygiene: Supervision/safety;Contact guard assist;Sit to/from stand Toileting - Clothing Manipulation Details (indicate cue type and reason): standing peri-care after cont urine on BSC     Functional mobility during ADLs: Minimal assistance;+2 for safety/equipment General ADL Comments: step by step multi modal cues for one step direction following  throughout session during ADL performance and ambulation    Extremity/Trunk Assessment              Vision       Perception     Praxis     Communication Communication Communication: Impaired Factors Affecting Communication: Reduced clarity of speech  (garbled speech, increased secretions; improved with cough and use of yankaur)   Cognition Arousal: Alert Behavior During Therapy: WFL for tasks assessed/performed, Restless Cognition: History of cognitive impairments, No family/caregiver present to determine baseline, Cognition impaired   Orientation impairments: Time, Situation, Place Awareness: Intellectual awareness impaired, Online awareness impaired Memory impairment (select all impairments): Short-term memory, Declarative long-term memory Attention impairment (select first level of impairment): Focused attention Executive functioning impairment (select all impairments): Sequencing, Reasoning, Problem solving OT - Cognition Comments: pt reports she is at home                 Following commands: Impaired Following commands impaired: Follows one step commands with increased time      Cueing   Cueing Techniques: Verbal cues  Exercises      Shoulder Instructions       General Comments all drains, incision and PICC intact pre/post session    Pertinent Vitals/ Pain       Pain Assessment Faces Pain Scale: Hurts even more Pain Location: abdominal incision Pain Descriptors / Indicators: Grimacing, Discomfort, Moaning Pain Intervention(s): Limited activity within patient's tolerance, Monitored during session, Repositioned  Home Living                                          Prior Functioning/Environment              Frequency  Min 2X/week        Progress Toward Goals  OT Goals(current goals can now be found in the care plan section)  Progress towards OT goals: Progressing toward goals  Acute Rehab OT Goals Patient Stated Goal: get better OT Goal Formulation: With patient Time For Goal Achievement: 05/13/24 Potential to Achieve Goals: Good  Plan      Co-evaluation    PT/OT/SLP Co-Evaluation/Treatment: Yes Reason for Co-Treatment: For patient/therapist safety;Necessary to address  cognition/behavior during functional activity PT goals addressed during session: Mobility/safety with mobility;Balance;Proper use of DME OT goals addressed during session: ADL's and self-care      AM-PAC OT 6 Clicks Daily Activity     Outcome Measure   Help from another person eating meals?: A Little (pt is NPO) Help from another person taking care of personal grooming?: A Little Help from another person toileting, which includes using toliet, bedpan, or urinal?: A Lot Help from another person bathing (including washing, rinsing, drying)?: A Lot Help from another person to put on and taking off regular upper body clothing?: A Little Help from another person to put on and taking off regular lower body clothing?: A Lot 6 Click Score: 15    End of Session Equipment Utilized During Treatment: Rolling Serano (2 wheels);Gait belt  OT Visit Diagnosis: Muscle weakness (generalized) (M62.81);Other abnormalities of gait and mobility (R26.89);Pain   Activity Tolerance Patient tolerated treatment well   Patient Left in chair;with call bell/phone within reach;with chair alarm set;with nursing/sitter in room   Nurse Communication Mobility status        Time: 8440-8374 OT Time Calculation (min): 26 min  Charges: OT General Charges $OT Visit:  1 Visit OT Treatments $Self Care/Home Management : 8-22 mins  Johanan Skorupski, OTR/L  05/02/24, 4:48 PM   Phyllis Abelson E Jayli Fogleman 05/02/2024, 4:44 PM

## 2024-05-02 NOTE — Progress Notes (Addendum)
 PROGRESS NOTE    Kylie [name]  FMW:[id number] DOB: 1955-07-28 DOA: 04/27/2024 PCP: [facility]  Chief Complaint  Patient presents with   Hypertension   Abdominal Pain    Hospital Course:  Kylie [name] is a 67 y.o. female with medical history significant of dementia, HTN, HLD, sCHF with EF 40-45%, CVA, CKD-3a, depression, DM, PE on Xarelto , s/p right nephrectomy in 2009 for right renal cell carcinoma presented with abdominal pain.  Poor historian due to dementia.  Workup revealed leukocytosis, lactic acidosis, imaging shows ischemic bowel disease, s/p exploratory laparotomy and small bowel resection with cecectomy on 10/30. Hospital course as below  Subjective: Patient was examined at bedside, states has abdominal discomfort, has sitter at the bedside Called [name]), provided updates and answered all questions   Objective: Vitals:   05/02/24 0509 05/02/24 0639 05/02/24 0721 05/02/24 1629  BP: (!) 179/85  (!) 167/78 (!) 175/96  Pulse: 89  90 (!) 109  Resp: 18  18 17   Temp: 98.3 F (36.8 C)  98.3 F (36.8 C) 98.9 F (37.2 C)  TempSrc:   Oral Oral  SpO2: 99%  100% 98%  Weight:  77.3 kg    Height:        Intake/Output Summary (Last 24 hours) at 05/02/2024 1729 Last data filed at 05/02/2024 1702 Gross per 24 hour  Intake 2398.51 ml  Output 3250 ml  Net -851.49 ml   Filed Weights   04/30/24 0500 05/01/24 0500 05/02/24 0639  Weight: 82.7 kg 83.5 kg 77.3 kg    Examination: General: alert, no acute distress Cardiac: RRR, No murmurs, No gallops or rubs Respiratory: No rales, wheezing, rhonchi or rubs GI: mild tenderness consistent post op Ext: No pitting leg edema bilaterally. 1+DP/PT pulse bilaterally. Musculoskeletal: No joint deformities, No joint redness or warmth, no limitation of ROM in spin. Skin: No rashes.  Neuro: awake, alert, baseline confused, no gross focal deficits  Assessment & Plan:  Ischemic bowel disease and closed loop  obstruction of intestine (HCC): - S/p exploratory laparotomy, reduction of internal hernia, small bowel resection with cecectomy (110 cm), ileocolic anastomosis -on 10/30 - start CLD per Surgery - Continue IV Zosyn, leukocytosis slowly downtrending - Has PICC line, TPN, due to postop ileus - Surgery following, appreciate recs - Pain management, antiemetics prn  Hypertensive urgency - resolved HTN - On Lasix , Cozaar , clonidine  patch at home - Resume amlodipine  - IV hydralazine  prn with holding parameters  AKI on CKD stage 3a - resolved - s/p Iv fluids - Monitor UO, Cr  Hypokalemia Hypophosphatemia - Mag normal - Monitor and replete as needed, pharmacy consulted  Acute drop in Hb Normocytic anemia - Hb dropped to 6.4, s/p 1u pRBC improved to 8.2, reported one episode of black BM 11/03 - Anticoagulation held - Monitor Hb and transfuse if < 7   History of pulmonary embolism - Hold Eliquis, resume when ok per Surgery - Received 4000 units of Kcentra for reversal on 10/30   Chronic combined systolic and diastolic CHF (congestive heart failure) (HCC): 2D echo on 10/08/2023 showed EF of 40-45% with grade 2 diastolic dysfunction.  Patient does not have leg edema or JVD.  CHF seems to be concentrated. - Hold Lasix , euvolemic   Acquired hypothyroidism - change IV to po Levothyroxine  100mcg   CVA (cerebral vascular accident) (HCC) - Hold Crestor , Eliquis   CAD (coronary artery disease): Troponin negative x 2 -Hold Crestor  now   HLD (hyperlipidemia) -Hold Crestor    Depression -Hold  Cymbalta  and olanzapine  due to QTc prolongation and npo  Dementia - Delirium precautions - Vit B12 1000 mcg IM once, po supplements once able to tolerate  -- PT/OT rec SNF  DVT prophylaxis: SCD's   Code Status: Full Code Disposition:  TBD  Consultants:  Treatment Team:  Consulting Physician: Desiderio Schanz, MD  Procedures:  exploratory laparotomy, reduction of internal hernia, small bowel  resection with cecectomy (110 cm), ileocolic anastomosis -on 10/30  Antimicrobials:  Anti-infectives (From admission, onward)    Start     Dose/Rate Route Frequency Ordered Stop   04/28/24 0600  piperacillin-tazobactam (ZOSYN) IVPB 3.375 g        3.375 g 12.5 mL/hr over 240 Minutes Intravenous Every 8 hours 04/28/24 0256     04/27/24 2300  cefoTEtan (CEFOTAN) 2 g in sodium chloride  0.9 % 100 mL IVPB        2 g 200 mL/hr over 30 Minutes Intravenous On call to O.R. 04/27/24 2255 04/28/24 1319       Data Reviewed: I have personally reviewed following labs and imaging studies CBC: Recent Labs  Lab 04/27/24 1834 04/28/24 0817 04/29/24 0640 04/30/24 0456 05/01/24 0432 05/01/24 1952 05/02/24 0715  WBC 12.4* 23.4* 22.2* 20.5* 15.2*  --  13.2*  NEUTROABS 10.5*  --   --   --   --   --   --   HGB 12.6 11.2* 8.3* 7.2* 6.4* 8.2* 8.6*  HCT 38.7 34.0* 25.5* 23.4* 19.9*  --  26.4*  MCV 95.8 96.3 98.5 101.7* 99.0  --  95.0  PLT 266 210 183 163 168  --  211   Basic Metabolic Panel: Recent Labs  Lab 04/27/24 1834 04/28/24 0817 04/28/24 0817 04/29/24 0640 04/30/24 0456 05/01/24 0432 05/01/24 1952 05/01/24 2208 05/02/24 0715  NA 138 143  --  140 143 142  --   --  141  K 3.8 4.3  --  4.4 3.5 3.8 6.6* 3.6 3.6  CL 100 106  --  104 109 112*  --   --  106  CO2 23 22  --  23 27 24   --   --  24  GLUCOSE 238* 161*  --  152* 127* 146*  --   --  149*  BUN 29* 29*  --  52* 46* 31*  --   --  22  CREATININE 1.16* 1.27*  --  2.36* 1.49* 1.03*  --   --  0.81  CALCIUM  9.6 8.5*  --  8.3* 8.4* 7.9*  --   --  8.6*  MG 1.9  --   --  1.9 2.0 1.9  --   --  1.9  PHOS  --   --    < > 4.9* 2.3* 2.1* 5.3* 2.6 2.4*   < > = values in this interval not displayed.   GFR: Estimated Creatinine Clearance: 69.9 mL/min (by C-G formula based on SCr of 0.81 mg/dL). Liver Function Tests: Recent Labs  Lab 04/27/24 1834 05/01/24 0432  AST 27 21  ALT 21 11  ALKPHOS 85 44  BILITOT 1.1 0.4  PROT 8.2* 5.8*   ALBUMIN 4.0 2.2*   CBG: Recent Labs  Lab 05/01/24 1738 05/02/24 0014 05/02/24 0519 05/02/24 1225 05/02/24 1630  GLUCAP 132* 143* 150* 149* 159*    Recent Results (from the past 240 hours)  MRSA Next Gen by PCR, Nasal     Status: None   Collection Time: 04/28/24  4:14 AM   Specimen: Nasal Mucosa; Nasal  Swab  Result Value Ref Range Status   MRSA by PCR Next Gen NOT DETECTED NOT DETECTED Final    Comment: (NOTE) The GeneXpert MRSA Assay (FDA approved for NASAL specimens only), is one component of a comprehensive MRSA colonization surveillance program. It is not intended to diagnose MRSA infection nor to guide or monitor treatment for MRSA infections. Test performance is not FDA approved in patients less than 45 years old. Performed at [facility], [address], [place], [place] [address]      Radiology Studies: No results found.   Scheduled Meds:  Chlorhexidine  Gluconate Cloth  6 each Topical Daily   insulin  aspart  0-6 Units Subcutaneous Q6H   [START ON 05/05/2024] levothyroxine   75 mcg Intravenous Daily   sodium chloride  flush  10-40 mL Intracatheter Q12H   thiamine (VITAMIN B1) injection  100 mg Intravenous Daily   Continuous Infusions:  piperacillin-tazobactam (ZOSYN)  IV 3.375 g (05/02/24 0959)   TPN ADULT (ION) 75 mL/hr at 05/02/24 0600   TPN ADULT (ION)       LOS: 5 days  MDM: Patient is high risk for one or more organ failure.  They necessitate ongoing hospitalization for continued IV therapies and subsequent lab monitoring. Total time spent interpreting labs and vitals, reviewing the medical record, coordinating care amongst consultants and care team members, directly assessing and discussing care with the patient and/or family: 55 min Laree Lock, MD [facility]  To contact the attending physician between 7A-7P please use Epic Chat. To contact the covering physician during after hours 7P-7A, please review Amion.  05/02/2024, 5:29 PM    *This document has been created with the assistance of dictation software. Please excuse typographical errors. *

## 2024-05-02 NOTE — Plan of Care (Signed)
   Problem: Nutritional: Goal: Maintenance of adequate nutrition will improve Outcome: Progressing   Problem: Skin Integrity: Goal: Risk for impaired skin integrity will decrease Outcome: Progressing   Problem: Tissue Perfusion: Goal: Adequacy of tissue perfusion will improve Outcome: Progressing

## 2024-05-02 NOTE — Progress Notes (Signed)
 PHARMACY - TOTAL PARENTERAL NUTRITION CONSULT NOTE   Indication: Small bowel obstruction  Patient Measurements: Height: 5' 10 (177.8 cm) Weight: 77.3 kg (170 lb 6.7 oz) (room phone and SCD machine removed from bed) IBW/kg (Calculated) : 68.5 TPN AdjBW (KG): 79.6 Body mass index is 24.45 kg/m. Usual Weight: 75.8 kg (02/07/24)  Assessment:  Kylie [name] is a 68yoF that presented with upper abdominal pain. Patient's past medical history notable for dementia, HTN, HLD, sCHF with EF 40-45% (10/08/23), CVA, CKD-3a, depression, DM, PE on apixaban, and s/p right nephrectomy in 2009 for right renal cell carcinoma.  Glucose / Insulin :  BG 128-150. Novolog  in the last 24 hours : 0 units.  Electrolytes:  Phos 4.9 > 2.3 > 2.1> 2.6>2.4  Correctd Ca 9.3   (Ca 7.9,  Alb 2.2) Renal:  Scr 1.27 > 2.36 > 1.49> 1.03>>0.81 (BL ~0.9-1) Hepatic: 11/4 labs  Alk phos 44, AST 21, ALT 11, total bili 0.4, albumin 2.2 Intake / Output; MIVF:  +4.1 L (10/30 1900 to 10/31 0700) GI Imaging: 10/30 CT Angio Impression: Closed loop small bowel obstruction likely secondary to an internal hernia with findings concerning for developing bowel ischemia. Clinical correlation and surgical consult is advised. Status post right nephrectomy GI Surgeries / Procedures:  10/31: Exploratory Laparotomy, reduction of internal hernia, small bowel resection with cecectomy (length 110 cm), ileocolic anastomosis  ID 10/31 Zosyn>> (Day5)   Central access: yes TPN start date: 04/28/2024  Nutritional Goals: Goal TPN rate is 75 mL/hr (provides 108 g of protein and 2005 kcals per day)  RD Assessment: Estimated Needs Total Energy Estimated Needs: 2000-2300kcal/day Total Protein Estimated Needs: 100-115g/day Total Fluid Estimated Needs: 1.8-2.1L/day  Current Nutrition:  NPO sips w/ meds  Plan:  ---Continue TPN at 75 ml/hr (goal rate) at 1800. Hx of HF will keep fluid low.   --Will order IV kphos 15 mmol x 1.   ---Electrolytes in TPN (standard): Na 50mEq/L, K 52mEq/L, Ca 34mEq/L, Mg 77mEq/L, and Phos 15mmol/L. Cl:Ac 1:1 ---Add standard MVI and trace elements to TPN --add thiamine 100mg  IV daily x 7 days per dietician (start 11/3)- outside TPN ---adjust very Sensitive q4h to q6h SSI and adjust as needed  --Monitor TPN labs on Mon/Thurs, daily until stable  Kylie [name], PharmD, BCPS Clinical Pharmacist 05/02/2024 8:50 AM

## 2024-05-02 NOTE — Progress Notes (Signed)
 [facility] SURGICAL PROGRESS NOTE  Hospital Day(s): 5.   Post op day(s): 5 Days Post-Op.   Interval History:  Patient seen and examined  Nothing acute overnight She does not provide much history  No fever, chills, emesis Leukocytosis improving; WBC 15.2K No new labs, Hgb improved to 8.2 yesterday evening  Surgical; drain with 350 ccs out; serous - I do think recorded output is error NPO + TPN She has had a recorded BM - confirmed with RN staff She is on Zosyn  Vital signs in last 24 hours: [min-max] current  Temp:  [98.3 F (36.8 C)-98.5 F (36.9 C)] 98.3 F (36.8 C) (11/04 0509) Pulse Rate:  [83-102] 89 (11/04 0509) Resp:  [16-20] 18 (11/04 0509) BP: (157-183)/(67-120) 179/85 (11/04 0509) SpO2:  [99 %-100 %] 99 % (11/04 0509) Weight:  [77.3 kg] 77.3 kg (11/04 0639)     Height: 5' 10 (177.8 cm) Weight: 77.3 kg (room phone and SCD machine removed from bed) BMI (Calculated): 24.45   Intake/Output last 2 shifts:  11/03 0701 - 11/04 0700 In: 2158.5 [I.V.:1614.2; Blood:300; IV Piggyback:244.3] Out: 2785 [Urine:2125; Drains:360; Blood:300]   Physical Exam:  Constitutional: sleeping, arouses; NAD - Safety sitter at bedside Respiratory: breathing non-labored at rest Cardiovascular: regular rate and sinus rhythm  Gastrointestinal: Soft, non-tender, she does not appear overtly distended. Surgical drain in left abdomen; output serous Genitourinary; Foley in place Integumentary: Laparotomy is intact with staples, drainage inferiorly on honeycomb (removed)  Labs:     Latest Ref Rng & Units 05/01/2024    7:52 PM 05/01/2024    4:32 AM 04/30/2024    4:56 AM  CBC  WBC 4.0 - 10.5 K/uL  15.2  20.5   Hemoglobin 12.0 - 15.0 g/dL 8.2  6.4  7.2   Hematocrit 36.0 - 46.0 %  19.9  23.4   Platelets 150 - 400 K/uL  168  163       Latest Ref Rng & Units 05/01/2024   10:08 PM 05/01/2024    7:52 PM 05/01/2024    4:32 AM  CMP  Glucose 70 - 99 mg/dL   853   BUN 8 - 23 mg/dL    31   Creatinine 9.55 - 1.00 mg/dL   8.96   Sodium 864 - 854 mmol/L   142   Potassium 3.5 - 5.1 mmol/L 3.6  6.6  3.8   Chloride 98 - 111 mmol/L   112   CO2 22 - 32 mmol/L   24   Calcium  8.9 - 10.3 mg/dL   7.9   Total Protein 6.5 - 8.1 g/dL   5.8   Total Bilirubin 0.0 - 1.2 mg/dL   0.4   Alkaline Phos 38 - 126 U/L   44   AST 15 - 41 U/L   21   ALT 0 - 44 U/L   11      Imaging studies: No new pertinent imaging studies   Assessment/Plan: 68 y.o. female 5 Days Post-Op s/p exploratory laparotomy with small bowel resection, ileocecectomy, ileocolonic anastomosis and reduction of internal hernia   - We can do CLD this morning given objective bowel function overnight   - Continue TPN for now; Monitor electrolytes  - Monitor H&H; responded to transfusion - Continue IV Abx (Zosyn); Day 5  - Continue surgical drain; monitor and record output - I do think we can remove this in the next day or so - Monitor abdominal examination; on-going bowel function    - Pain  control prn; antiemetics prn - Mobilize; okay to work with therapies  - Further management per primary service; we will follow    All of the above findings and recommendations were discussed with the patient, and the medical team, and all of patient's questions were answered to her expressed satisfaction.  -- Arthea Platt, PA-C [facility] 05/02/2024, 7:03 AM M-F: 7am - 4pm

## 2024-05-03 ENCOUNTER — Inpatient Hospital Stay

## 2024-05-03 ENCOUNTER — Encounter: Payer: Self-pay | Admitting: Internal Medicine

## 2024-05-03 DIAGNOSIS — K559 Vascular disorder of intestine, unspecified: Secondary | ICD-10-CM | POA: Diagnosis not present

## 2024-05-03 LAB — GLUCOSE, CAPILLARY
Glucose-Capillary: 140 mg/dL — ABNORMAL HIGH (ref 70–99)
Glucose-Capillary: 146 mg/dL — ABNORMAL HIGH (ref 70–99)
Glucose-Capillary: 150 mg/dL — ABNORMAL HIGH (ref 70–99)
Glucose-Capillary: 152 mg/dL — ABNORMAL HIGH (ref 70–99)
Glucose-Capillary: 162 mg/dL — ABNORMAL HIGH (ref 70–99)

## 2024-05-03 LAB — CBC
HCT: 27.5 % — ABNORMAL LOW (ref 36.0–46.0)
Hemoglobin: 9 g/dL — ABNORMAL LOW (ref 12.0–15.0)
MCH: 31.1 pg (ref 26.0–34.0)
MCHC: 32.7 g/dL (ref 30.0–36.0)
MCV: 95.2 fL (ref 80.0–100.0)
Platelets: 220 K/uL (ref 150–400)
RBC: 2.89 MIL/uL — ABNORMAL LOW (ref 3.87–5.11)
RDW: 14.1 % (ref 11.5–15.5)
WBC: 11 K/uL — ABNORMAL HIGH (ref 4.0–10.5)
nRBC: 0 % (ref 0.0–0.2)

## 2024-05-03 LAB — BASIC METABOLIC PANEL WITH GFR
Anion gap: 6 (ref 5–15)
BUN: 21 mg/dL (ref 8–23)
CO2: 23 mmol/L (ref 22–32)
Calcium: 8.3 mg/dL — ABNORMAL LOW (ref 8.9–10.3)
Chloride: 108 mmol/L (ref 98–111)
Creatinine, Ser: 0.77 mg/dL (ref 0.44–1.00)
GFR, Estimated: >60 mL/min (ref >60)
Glucose, Bld: 150 mg/dL — ABNORMAL HIGH (ref 70–99)
Potassium: 4 mmol/L (ref 3.5–5.1)
Sodium: 137 mmol/L (ref 135–145)

## 2024-05-03 LAB — PHOSPHORUS: Phosphorus: 2.8 mg/dL (ref 2.5–4.6)

## 2024-05-03 LAB — MAGNESIUM: Magnesium: 1.8 mg/dL (ref 1.7–2.4)

## 2024-05-03 MED ORDER — IOHEXOL 9 MG/ML PO SOLN
500.0000 mL | ORAL | Status: AC
  Administered 2024-05-03 (×2): 500 mL via ORAL

## 2024-05-03 MED ORDER — HYDROMORPHONE HCL 1 MG/ML IJ SOLN
0.5000 mg | Freq: Once | INTRAMUSCULAR | Status: AC
  Administered 2024-05-03: 0.5 mg via INTRAVENOUS
  Filled 2024-05-03: qty 0.5

## 2024-05-03 MED ORDER — IOHEXOL 300 MG/ML  SOLN
100.0000 mL | Freq: Once | INTRAMUSCULAR | Status: AC | PRN
  Administered 2024-05-03: 100 mL via INTRAVENOUS

## 2024-05-03 MED ORDER — TRACE MINERALS CU-MN-SE-ZN 300-55-60-3000 MCG/ML IV SOLN
INTRAVENOUS | Status: AC
  Filled 2024-05-03: qty 720

## 2024-05-03 MED ORDER — IRBESARTAN 150 MG PO TABS
150.0000 mg | ORAL_TABLET | Freq: Every day | ORAL | Status: DC
  Administered 2024-05-03 – 2024-05-18 (×16): 150 mg via ORAL
  Filled 2024-05-03 (×16): qty 1

## 2024-05-03 MED ORDER — CLONIDINE HCL 0.1 MG/24HR TD PTWK
0.1000 mg | MEDICATED_PATCH | TRANSDERMAL | Status: DC
  Administered 2024-05-03 – 2024-05-17 (×3): 0.1 mg via TRANSDERMAL
  Filled 2024-05-03 (×4): qty 1

## 2024-05-03 NOTE — Progress Notes (Signed)
 PROGRESS NOTE Kylie [name]  FMW:[id number] DOB: 1956/04/02 DOA: 04/27/2024 PCP: [facility]   [facility] Course:  Kylie [name] is a 68 y.o. female with medical history significant of dementia, HTN, HLD, sCHF with EF 40-45%, CVA, CKD-3a, depression, DM, PE on Xarelto , s/p right nephrectomy in 2009 for right renal cell carcinoma presented with abdominal pain.  Workup revealed leukocytosis, lactic acidosis, imaging shows ischemic bowel disease. General surgery was consulted- she is now s/p exploratory laparotomy 10/30 with small bowel resection with ileocecectomy, ileocolonic anastamosis and reduction of internal hernia. Course is complicated by post-op ileus and TPN requirement.   Subjective: Patient reports upper abdominal pain today and anorexia.   Objective: Blood pressure (!) 152/91, pulse (!) 117, temperature 98.5 F (36.9 C), resp. rate 20, height 5' 10 (1.778 m), weight 81.1 kg, SpO2 100%.  Intake/Output Summary (Last 24 hours) at 05/03/2024 0738 Last data filed at 05/03/2024 0500 Gross per 24 hour  Intake 1209.63 ml  Output 800 ml  Net 409.63 ml   Examination: General: alert, no acute distress Cardiac: RRR, No murmurs, No gallops or rubs Respiratory: No rales, wheezing, rhonchi or rubs GI: mild tenderness consistent post op Ext: No pitting leg edema bilaterally. 1+DP/PT pulse bilaterally. Musculoskeletal: No joint deformities, No joint redness or warmth, no limitation of ROM in spin. Skin: No rashes.  Neuro: awake, alert, baseline confused, no gross focal deficits  Assessment & Plan:  Ischemic bowel disease and closed loop obstruction of intestine (HCC): - S/p exploratory laparotomy, reduction of internal hernia, small bowel resection with ileocecectomy (110 cm), ileocolic anastomosis -on 10/30 - with severe increase in abdominal pain- I have made her NPO again pending CT scan ordered by surgery.  - surgical drain is draining appropriately. Surgical incision  appears to be healing well.  - continue TPN - analgesia PRN and limiting opioids as much as possible - Continue IV Zosyn, leukocytosis slowly downtrending - Surgery following, appreciate recs - Pain management, antiemetics prn  Hypertensive urgency - resolved HTN- On Lasix , Cozaar , clonidine  patch at home - Resume amlodipine , clonidine  patch  AKI on CKD stage 3a - resolved - s/p Iv fluids - Monitor UO, Cr  Hypokalemia Hypophosphatemia - Mag normal - Monitor and replete as needed, pharmacy consulted  Normocytic anemia - Hb dropped to 6.4, s/p 1u pRBC improved to 8.2, reported one episode of black BM 11/03. Has maintained steady since then.  - Anticoagulation held - Monitor Hb and transfuse if < 7   History of pulmonary embolism - Hold Eliquis, resume when ok per Surgery - Received 4000 units of Kcentra for reversal on 10/30   Chronic combined systolic and diastolic CHF (congestive heart failure) (HCC): 2D echo on 10/08/2023 showed EF of 40-45% with grade 2 diastolic dysfunction.  Patient does not have leg edema or JVD.  CHF seems to be concentrated. - Hold Lasix , euvolemic   Acquired hypothyroidism - change IV to po Levothyroxine  100mcg   CVA (cerebral vascular accident) (HCC) - Hold Crestor , Eliquis   CAD (coronary artery disease): Troponin negative x 2 -Hold Crestor  now   HLD (hyperlipidemia) -Hold Crestor    Depression -Hold Cymbalta  and olanzapine  due to QTc prolongation and npo  Dementia - Delirium precautions - Vit B12 1000 mcg IM once, po supplements once able to tolerate  -- PT/OT rec SNF  DVT prophylaxis: SCD's   Code Status: Full Code Disposition:  TBD  Consultants:  Treatment Team:  Consulting Physician: Desiderio Schanz, MD  Procedures:  exploratory laparotomy, reduction of  internal hernia, small bowel resection with cecectomy (110 cm), ileocolic anastomosis -on 10/30  Data Reviewed: I have personally reviewed following labs and imaging  studies CBC: Recent Labs  Lab 04/27/24 1834 04/28/24 0817 04/29/24 0640 04/30/24 0456 05/01/24 0432 05/01/24 1952 05/02/24 0715 05/03/24 0448  WBC 12.4*   < > 22.2* 20.5* 15.2*  --  13.2* 11.0*  NEUTROABS 10.5*  --   --   --   --   --   --   --   HGB 12.6   < > 8.3* 7.2* 6.4* 8.2* 8.6* 9.0*  HCT 38.7   < > 25.5* 23.4* 19.9*  --  26.4* 27.5*  MCV 95.8   < > 98.5 101.7* 99.0  --  95.0 95.2  PLT 266   < > 183 163 168  --  211 220   < > = values in this interval not displayed.   Basic Metabolic Panel: Recent Labs  Lab 04/29/24 0640 04/30/24 0456 05/01/24 0432 05/01/24 1952 05/01/24 2208 05/02/24 0715 05/03/24 0448  NA 140 143 142  --   --  141 137  K 4.4 3.5 3.8 6.6* 3.6 3.6 4.0  CL 104 109 112*  --   --  106 108  CO2 23 27 24   --   --  24 23  GLUCOSE 152* 127* 146*  --   --  149* 150*  BUN 52* 46* 31*  --   --  22 21  CREATININE 2.36* 1.49* 1.03*  --   --  0.81 0.77  CALCIUM  8.3* 8.4* 7.9*  --   --  8.6* 8.3*  MG 1.9 2.0 1.9  --   --  1.9 1.8  PHOS 4.9* 2.3* 2.1* 5.3* 2.6 2.4* 2.8   GFR: Estimated Creatinine Clearance: 70.8 mL/min (by C-G formula based on SCr of 0.77 mg/dL). Liver Function Tests: Recent Labs  Lab 04/27/24 1834 05/01/24 0432  AST 27 21  ALT 21 11  ALKPHOS 85 44  BILITOT 1.1 0.4  PROT 8.2* 5.8*  ALBUMIN 4.0 2.2*    LOS: 6 days  MDM: Patient is high risk for one or more organ failure.  They necessitate ongoing hospitalization for continued IV therapies and subsequent lab monitoring. Total time spent interpreting labs and vitals, reviewing the medical record, coordinating care amongst consultants and care team members, directly assessing and discussing care with the patient and/or family: 65 min Marien LITTIE Piety, MD [facility]  To contact the attending physician between 7A-7P please use Epic Chat. To contact the covering physician during after hours 7P-7A, please review Amion.  05/03/2024, 7:38 AM

## 2024-05-03 NOTE — Progress Notes (Signed)
 Nutrition Follow-up  DOCUMENTATION CODES:   Non-severe (moderate) malnutrition in context of chronic illness  INTERVENTION:   -Continue TPN management per pharmacy -Monitor Mg, K, and Phos and replete as needed secondary to high refeeding risk -Continue daily weights -Recommend 100 mg thiamine daily x 7 days -RD will monitor for ability to transition to PO diet vs enteral nutrition  NUTRITION DIAGNOSIS:   Moderate Malnutrition related to chronic illness as evidenced by moderate fat depletion, moderate muscle depletion.  Ongoing  GOAL:   Patient will meet greater than or equal to 90% of their needs  Met with TPN  MONITOR:   Diet advancement, Labs, Weight trends, Skin, I & O's, Other (Comment) (TPN)  REASON FOR ASSESSMENT:   Consult New TPN/TNA  ASSESSMENT:   68 y.o. female with h/o dementia, HTN, HLD, CHF, CVA, CKD-3a, depression, DM, PE on Xarelto , RCC s/p right nephrectomy (2009), hypothryoidism, CAD and OSA who is admitted with SBO secondary to Internal hernia resulting in small bowel ischemia and necrosis now s/p exploratory laparotomy, reduction of internal hernia, small bowel resection with cecectomy (~110cm ileum along with IC valve) and ileocolic anastomosis 10/31.  10/31- s/p s/p exploratory laparotomy with small bowel resection, ileocecectomy, ileocolonic anastomosis and reduction of internal hernia, PICC placed, TPN initiated 11/1- NGT removed by patient, pulled out PICC line 11/2- PICC line replaced 11/3- IV thiamine ordered  Reviewed I/O's: +60 ml x 24 hours and +5 L since admission  UOP: 1.1 L x 24 hours   Drain output: 100 ml x 24 hours  Per general surgery notes, plan for CT of abdomen and pelvis with contrast to assess for intra-abdomina process. There is suspicion for prolonged ileus secondary to  with necrosis of small bowel, emergent surgery, and  prolonged hospitalization/deconditioning. Possible plan to remove drain in 1-2 days.   Patient  remains NPO and receiving TPN for sole source nutrition. TPN is infusion at 75 ml/hr, which provides 2005 kcals and 108 grams protein, meeting 100% of estimated kcal and protein needs.   Weight has been stable since admission.   Per TOC notes, plan to discharge to SNF once medically stable.   Medications reviewed and include thiamine.   Labs reviewed: K, Mg, and Phos WDL. CBGS: 140-162 (inpatient orders for glycemic control are 0-6 units insulin  aspart every 6 hours).    Diet Order:   Diet Order             Diet NPO time specified Except for: Sips with Meds, Ice Chips  Diet effective now                   EDUCATION NEEDS:   Not appropriate for education at this time  Skin:  Skin Assessment: Skin Integrity Issues: Skin Integrity Issues:: Incisions Incisions: closed abdomen  Last BM:  05/02/24 (type 7)  Height:   Ht Readings from Last 1 Encounters:  04/28/24 5' 10 (1.778 m)    Weight:   Wt Readings from Last 1 Encounters:  05/03/24 81.1 kg    Ideal Body Weight:  68.1 kg  BMI:  Body mass index is 25.65 kg/m.  Estimated Nutritional Needs:   Kcal:  2000-2300kcal/day  Protein:  100-115g/day  Fluid:  1.8-2.1L/day    Margery ORN, RD, LDN, CDCES Registered Dietitian III Certified Diabetes Care and Education Specialist If unable to reach this RD, please use RD Inpatient group chat on secure chat between hours of 8am-4 pm daily

## 2024-05-03 NOTE — Progress Notes (Addendum)
 [facility] SURGICAL PROGRESS NOTE  Hospital Day(s): 6.   Post op day(s): 6 Days Post-Op.   Interval History:  Patient seen and examined  Nothing acute overnight She is much more alert and oriented this AM than previous;y Abdominal soreness at midline  No fever, chills, emesis Leukocytosis improving; WBC 11.0K Hgb to 9.0 Renal function normal; sCr - 0.77; UO - 1050 ccs No electrolyte derangements Surgical; drain with 100 ccs out; serous She is on CLD; She does not believe she has eaten TPN She has had another recorded BM in last 24 hours  She is on Zosyn  Vital signs in last 24 hours: [min-max] current  Temp:  [98.3 F (36.8 C)-98.9 F (37.2 C)] 98.7 F (37.1 C) (11/05 0508) Pulse Rate:  [86-109] 86 (11/05 0508) Resp:  [16-17] 16 (11/05 0508) BP: (117-175)/(63-96) 162/90 (11/05 0508) SpO2:  [98 %-100 %] 100 % (11/05 0508) Weight:  [81.1 kg] 81.1 kg (11/05 0508)     Height: 5' 10 (177.8 cm) Weight: 81.1 kg BMI (Calculated): 25.65   Intake/Output last 2 shifts:  11/04 0701 - 11/05 0700 In: 1209.6 [P.O.:360; I.V.:849.6] Out: 1150 [Urine:1050; Drains:100]   Physical Exam:  Constitutional: sleeping, arouses; NAD - Safety sitter at bedside Respiratory: breathing non-labored at rest Cardiovascular: regular rate and sinus rhythm  Gastrointestinal: Soft, non-tender, she does not appear overtly distended but remains tympanic. Surgical drain in left abdomen; output serous Genitourinary; Foley in place Integumentary: Laparotomy is intact with staples, drainage inferiorly on honeycomb (removed)  Labs:     Latest Ref Rng & Units 05/03/2024    4:48 AM 05/02/2024    7:15 AM 05/01/2024    7:52 PM  CBC  WBC 4.0 - 10.5 K/uL 11.0  13.2    Hemoglobin 12.0 - 15.0 g/dL 9.0  8.6  8.2   Hematocrit 36.0 - 46.0 % 27.5  26.4    Platelets 150 - 400 K/uL 220  211        Latest Ref Rng & Units 05/03/2024    4:48 AM 05/02/2024    7:15 AM 05/01/2024   10:08 PM  CMP   Glucose 70 - 99 mg/dL 849  850    BUN 8 - 23 mg/dL 21  22    Creatinine 9.55 - 1.00 mg/dL 9.22  9.18    Sodium 864 - 145 mmol/L 137  141    Potassium 3.5 - 5.1 mmol/L 4.0  3.6  3.6   Chloride 98 - 111 mmol/L 108  106    CO2 22 - 32 mmol/L 23  24    Calcium  8.9 - 10.3 mg/dL 8.3  8.6       Imaging studies: No new pertinent imaging studies   Assessment/Plan: 68 y.o. female 6 Days Post-Op s/p exploratory laparotomy with small bowel resection, ileocecectomy, ileocolonic anastomosis and reduction of internal hernia   - Although she has had BM recorded, she is not a reliable historian. Her examination is reassuring but out of caution, I will plan for CT Abdomen/Pelvis, ideally with PO contrast if tolerated, to ensure no missed intra-abdominal process. Suspect prolonged ileus secondary to presentation with necrosis of small bowel, emergent surgery, prolonged hospitalization/deconditioning    - Keep CLD for now   - Continue TPN for now; Monitor electrolytes  - Monitor H&H; responded to transfusion - Continue IV Abx (Zosyn); Day 6  - Continue surgical drain; monitor and record output - I do think we can remove this in the next day or so -  Monitor abdominal examination; on-going bowel function    - Pain control prn; antiemetics prn - Mobilize; okay to work with therapies  - Further management per primary service; we will follow    All of the above findings and recommendations were discussed with the patient, and the medical team, and all of patient's questions were answered to her expressed satisfaction.  -- Arthea Platt, PA-C [facility] 05/03/2024, 7:24 AM M-F: 7am - 4pm

## 2024-05-03 NOTE — Plan of Care (Signed)
 Patient is alert and oriented to self only, 1:1 safety sitter at bedside. Clear lung sounds, on room air no noted cough. Abdomen soft, last bowel movement 11/4, +bowel sounds. Voiding without any difficulty. +CMS, denies numbness or tingling. Tolerating clear liquid diet, Q6hr glucose check, TPN infusing per order. Ambulating 2 assist with front wheel Coluccio out of bed to bedside commode. Honeycomb dressing to abdomen C/D/I. Hourly rounding performed, fall precautions maintained, and call bell within reach.   Problem: Education: Goal: Ability to describe self-care measures that may prevent or decrease complications (Diabetes Survival Skills Education) will improve Outcome: Progressing Goal: Individualized Educational Video(s) Outcome: Progressing   Problem: Coping: Goal: Ability to adjust to condition or change in health will improve Outcome: Progressing   Problem: Fluid Volume: Goal: Ability to maintain a balanced intake and output will improve Outcome: Progressing   Problem: Health Behavior/Discharge Planning: Goal: Ability to identify and utilize available resources and services will improve Outcome: Progressing Goal: Ability to manage health-related needs will improve Outcome: Progressing   Problem: Metabolic: Goal: Ability to maintain appropriate glucose levels will improve Outcome: Progressing   Problem: Nutritional: Goal: Maintenance of adequate nutrition will improve Outcome: Progressing Goal: Progress toward achieving an optimal weight will improve Outcome: Progressing   Problem: Skin Integrity: Goal: Risk for impaired skin integrity will decrease Outcome: Progressing   Problem: Tissue Perfusion: Goal: Adequacy of tissue perfusion will improve Outcome: Progressing   Problem: Education: Goal: Knowledge of General Education information will improve Description: Including pain rating scale, medication(s)/side effects and non-pharmacologic comfort measures Outcome:  Progressing   Problem: Health Behavior/Discharge Planning: Goal: Ability to manage health-related needs will improve Outcome: Progressing   Problem: Clinical Measurements: Goal: Ability to maintain clinical measurements within normal limits will improve Outcome: Progressing Goal: Will remain free from infection Outcome: Progressing Goal: Diagnostic test results will improve Outcome: Progressing Goal: Respiratory complications will improve Outcome: Progressing Goal: Cardiovascular complication will be avoided Outcome: Progressing   Problem: Activity: Goal: Risk for activity intolerance will decrease Outcome: Progressing   Problem: Nutrition: Goal: Adequate nutrition will be maintained Outcome: Progressing   Problem: Coping: Goal: Level of anxiety will decrease Outcome: Progressing   Problem: Elimination: Goal: Will not experience complications related to bowel motility Outcome: Progressing Goal: Will not experience complications related to urinary retention Outcome: Progressing   Problem: Pain Managment: Goal: General experience of comfort will improve and/or be controlled Outcome: Progressing   Problem: Safety: Goal: Ability to remain free from injury will improve Outcome: Progressing   Problem: Skin Integrity: Goal: Risk for impaired skin integrity will decrease Outcome: Progressing

## 2024-05-03 NOTE — Progress Notes (Signed)
 PHARMACY - TOTAL PARENTERAL NUTRITION CONSULT NOTE   Indication: Small bowel obstruction  Patient Measurements: Height: 5' 10 (177.8 cm) Weight: 81.1 kg (178 lb 12.7 oz) IBW/kg (Calculated) : 68.5 TPN AdjBW (KG): 79.6 Body mass index is 25.65 kg/m. Usual Weight: 75.8 kg (02/07/24)  Assessment:  Kylie [name] is a 68yoF that presented with upper abdominal pain. Patient's past medical history notable for dementia, HTN, HLD, sCHF with EF 40-45% (10/08/23), CVA, CKD-3a, depression, DM, PE on apixaban, and s/p right nephrectomy in 2009 for right renal cell carcinoma.  Glucose / Insulin :  BG 140-162. Novolog  in the last 24 hours : 2 units.  Electrolytes:  Phos 4.9 > 2.3 > 2.1> 2.6>2.4  Correctd Ca 9.3   (Ca 7.9,  Alb 2.2) Renal:  Scr 1.27 > 2.36 > 1.49> 1.03>>0.81 (BL ~0.9-1) Hepatic: 11/4 labs  Alk phos 44, AST 21, ALT 11, total bili 0.4, albumin 2.2 Intake / Output; MIVF:  +4.1 L (10/30 1900 to 10/31 0700) GI Imaging: 10/30 CT Angio Impression: Closed loop small bowel obstruction likely secondary to an internal hernia with findings concerning for developing bowel ischemia. Clinical correlation and surgical consult is advised. Status post right nephrectomy GI Surgeries / Procedures:  10/31: Exploratory Laparotomy, reduction of internal hernia, small bowel resection with cecectomy (length 110 cm), ileocolic anastomosis  ID 10/31 Zosyn>> (Day6)   Central access: yes TPN start date: 04/28/2024  Nutritional Goals: Goal TPN rate is 75 mL/hr (provides 108 g of protein and 2005 kcals per day)  RD Assessment: Estimated Needs Total Energy Estimated Needs: 2000-2300kcal/day Total Protein Estimated Needs: 100-115g/day Total Fluid Estimated Needs: 1.8-2.1L/day  Current Nutrition:  NPO sips w/ meds  Plan:  ---Continue TPN at 75 ml/hr (goal rate) at 1800. Hx of HF will keep fluid low.   ---Electrolytes in TPN (standard): Na 50mEq/L, K 63mEq/L, Ca 36mEq/L, Mg 77mEq/L, and Phos  15mmol/L. Cl:Ac 1:1 ---Add standard MVI and trace elements to TPN --add thiamine 100mg  IV daily x 7 days per dietician (start 11/3)- outside TPN ---adjust very Sensitive q4h to q6h SSI and adjust as needed  --Monitor TPN labs on Mon/Thurs, daily until stable  Estill CHRISTELLA Lutes, PharmD, BCPS Clinical Pharmacist 05/03/2024 9:13 AM

## 2024-05-04 DIAGNOSIS — Z7189 Other specified counseling: Secondary | ICD-10-CM | POA: Diagnosis not present

## 2024-05-04 DIAGNOSIS — F03C0 Unspecified dementia, severe, without behavioral disturbance, psychotic disturbance, mood disturbance, and anxiety: Secondary | ICD-10-CM | POA: Diagnosis not present

## 2024-05-04 DIAGNOSIS — Z515 Encounter for palliative care: Secondary | ICD-10-CM

## 2024-05-04 DIAGNOSIS — K559 Vascular disorder of intestine, unspecified: Secondary | ICD-10-CM | POA: Diagnosis not present

## 2024-05-04 LAB — CBC
HCT: 25.8 % — ABNORMAL LOW (ref 36.0–46.0)
Hemoglobin: 8.4 g/dL — ABNORMAL LOW (ref 12.0–15.0)
MCH: 31.3 pg (ref 26.0–34.0)
MCHC: 32.6 g/dL (ref 30.0–36.0)
MCV: 96.3 fL (ref 80.0–100.0)
Platelets: 259 K/uL (ref 150–400)
RBC: 2.68 MIL/uL — ABNORMAL LOW (ref 3.87–5.11)
RDW: 13.9 % (ref 11.5–15.5)
WBC: 12 K/uL — ABNORMAL HIGH (ref 4.0–10.5)
nRBC: 0 % (ref 0.0–0.2)

## 2024-05-04 LAB — COMPREHENSIVE METABOLIC PANEL WITH GFR
ALT: 96 U/L — ABNORMAL HIGH (ref 0–44)
AST: 75 U/L — ABNORMAL HIGH (ref 15–41)
Albumin: 2.3 g/dL — ABNORMAL LOW (ref 3.5–5.0)
Alkaline Phosphatase: 74 U/L (ref 38–126)
Anion gap: 6 (ref 5–15)
BUN: 24 mg/dL — ABNORMAL HIGH (ref 8–23)
CO2: 23 mmol/L (ref 22–32)
Calcium: 8.2 mg/dL — ABNORMAL LOW (ref 8.9–10.3)
Chloride: 108 mmol/L (ref 98–111)
Creatinine, Ser: 0.85 mg/dL (ref 0.44–1.00)
GFR, Estimated: >60 mL/min (ref >60)
Glucose, Bld: 143 mg/dL — ABNORMAL HIGH (ref 70–99)
Potassium: 3.5 mmol/L (ref 3.5–5.1)
Sodium: 137 mmol/L (ref 135–145)
Total Bilirubin: 0.4 mg/dL (ref 0.0–1.2)
Total Protein: 6.3 g/dL — ABNORMAL LOW (ref 6.5–8.1)

## 2024-05-04 LAB — GLUCOSE, CAPILLARY
Glucose-Capillary: 125 mg/dL — ABNORMAL HIGH (ref 70–99)
Glucose-Capillary: 135 mg/dL — ABNORMAL HIGH (ref 70–99)
Glucose-Capillary: 137 mg/dL — ABNORMAL HIGH (ref 70–99)
Glucose-Capillary: 147 mg/dL — ABNORMAL HIGH (ref 70–99)
Glucose-Capillary: 165 mg/dL — ABNORMAL HIGH (ref 70–99)

## 2024-05-04 LAB — MAGNESIUM: Magnesium: 1.8 mg/dL (ref 1.7–2.4)

## 2024-05-04 LAB — PHOSPHORUS: Phosphorus: 2.8 mg/dL (ref 2.5–4.6)

## 2024-05-04 MED ORDER — MAGNESIUM SULFATE IN D5W 1-5 GM/100ML-% IV SOLN
1.0000 g | Freq: Once | INTRAVENOUS | Status: AC
  Administered 2024-05-04: 1 g via INTRAVENOUS
  Filled 2024-05-04 (×2): qty 100

## 2024-05-04 MED ORDER — ORAL CARE MOUTH RINSE: 15.0000 mL | OROMUCOSAL | Status: DC | PRN

## 2024-05-04 MED ORDER — ACETAMINOPHEN 650 MG RE SUPP
650.0000 mg | Freq: Three times a day (TID) | RECTAL | Status: DC | PRN
  Administered 2024-05-04: 650 mg via RECTAL
  Filled 2024-05-04: qty 1

## 2024-05-04 MED ORDER — SODIUM CHLORIDE 0.9 % IV SOLN: INTRAVENOUS | Status: AC

## 2024-05-04 MED ORDER — TRACE MINERALS CU-MN-SE-ZN 300-55-60-3000 MCG/ML IV SOLN
INTRAVENOUS | Status: AC
  Filled 2024-05-04: qty 720

## 2024-05-04 NOTE — Plan of Care (Signed)

## 2024-05-04 NOTE — Progress Notes (Signed)
 [facility] SURGICAL PROGRESS NOTE  Hospital Day(s): 7.   Post op day(s): 7 Days Post-Op.   Interval History:  Patient seen and examined  Nothing acute overnight She has had low grade temperatures over night; T-max 100.43F at 0730 She is alert Abdominal pain reported at incisions She remains distended  No fever, chills, emesis CBC added on to current labs given low grade temperatures  Renal function normal; sCr - 0.85; UO - unmeasured No electrolyte derangements Surgical; drain with 180 ccs out; serous NPO this morning; TPN She is having bowel function; confirmed with RN staff  She is on Zosyn  Vital signs in last 24 hours: [min-max] current  Temp:  [97 F (36.1 C)-100.1 F (37.8 C)] 100.1 F (37.8 C) (11/06 0737) Pulse Rate:  [101-117] 101 (11/06 0737) Resp:  [17-25] 19 (11/06 0737) BP: (138-157)/(78-96) 144/80 (11/06 0737) SpO2:  [100 %] 100 % (11/06 0737) Weight:  [81.5 kg] 81.5 kg (11/06 0444)     Height: 5' 10 (177.8 cm) Weight: 81.5 kg BMI (Calculated): 25.78   Intake/Output last 2 shifts:  11/05 0701 - 11/06 0700 In: 1733.1 [I.V.:1483.1; IV Piggyback:250] Out: 180 [Drains:180]   Physical Exam:  Constitutional: alert, cooperative; NAD - Safety sitter at bedside Respiratory: breathing non-labored at rest Cardiovascular: regular rate and sinus rhythm  Gastrointestinal: Soft, incisional soreness, certainly still distended. Surgical drain in left abdomen; output serous Integumentary: Laparotomy is intact with staples; no drainage   Labs:     Latest Ref Rng & Units 05/03/2024    4:48 AM 05/02/2024    7:15 AM 05/01/2024    7:52 PM  CBC  WBC 4.0 - 10.5 K/uL 11.0  13.2    Hemoglobin 12.0 - 15.0 g/dL 9.0  8.6  8.2   Hematocrit 36.0 - 46.0 % 27.5  26.4    Platelets 150 - 400 K/uL 220  211        Latest Ref Rng & Units 05/04/2024    5:22 AM 05/03/2024    4:48 AM 05/02/2024    7:15 AM  CMP  Glucose 70 - 99 mg/dL 856  849  850   BUN 8 - 23 mg/dL 24   21  22    Creatinine 0.44 - 1.00 mg/dL 9.14  9.22  9.18   Sodium 135 - 145 mmol/L 137  137  141   Potassium 3.5 - 5.1 mmol/L 3.5  4.0  3.6   Chloride 98 - 111 mmol/L 108  108  106   CO2 22 - 32 mmol/L 23  23  24    Calcium  8.9 - 10.3 mg/dL 8.2  8.3  8.6   Total Protein 6.5 - 8.1 g/dL 6.3     Total Bilirubin 0.0 - 1.2 mg/dL 0.4     Alkaline Phos 38 - 126 U/L 74     AST 15 - 41 U/L 75     ALT 0 - 44 U/L 96        Imaging studies:   CT Abdomen/Pelvis (05/03/2024) personally reviewed with bowel dilation consistent with ileus, no evidence of obstruction, no free air, no gross abscess, and radiologist report reviewed below:  IMPRESSION: 1. Dilated small-bowel loops up to 4.3 cm without focal obstruction, consistent with ileus. 2. Small amount of free fluid.   Assessment/Plan: 68 y.o. female 7 Days Post-Op s/p exploratory laparotomy with small bowel resection, ileocecectomy, ileocolonic anastomosis and reduction of internal hernia   - Noted low grade temperatures overnight (100.43F this AM). Her abdominal exam  is unchanged and CT yesterday consistent with ileus without evidence of continued obstruction, perforation, bowel compromise, or abscess. I did add CBC this morning to ensure continued improvement in leukocytosis.   - I do think involvement of palliative care is appropriate to reviewed GOC  - NPO for now given distension, She is having bowel function which is encouraging   - Continue TPN for now; Monitor electrolytes  - Monitor H&H; responded to transfusion - Continue IV Abx (Zosyn); Day 7  - Continue surgical drain; monitor and record output - Monitor abdominal examination; on-going bowel function    - Pain control prn; antiemetics prn - Mobilize; okay to work with therapies; on-board - Further management per primary service; we will follow    All of the above findings and recommendations were discussed with the patient, and the medical team, and all of patient's questions were  answered to her expressed satisfaction.  -- Arthea Platt, PA-C [facility] 05/04/2024, 8:46 AM M-F: 7am - 4pm

## 2024-05-04 NOTE — Progress Notes (Signed)
 PROGRESS NOTE Kylie [name]  FMW:[id number] DOB: 1955/10/31 DOA: 04/27/2024 PCP: [facility]   [place] [facility] Course:  Kylie [name] is a 68 y.o. female with medical history significant of dementia, HTN, HLD, sCHF with EF 40-45%, CVA, CKD-3a, depression, DM, PE on Xarelto , s/p right nephrectomy in 2009 for right renal cell carcinoma presented with abdominal pain.  Workup revealed leukocytosis, lactic acidosis, imaging shows ischemic bowel disease. General surgery was consulted- she is now s/p exploratory laparotomy 10/30 with small bowel resection with ileocecectomy, ileocolonic anastamosis and reduction of internal hernia. Course is complicated by post-op ileus and TPN requirement.   Subjective: Patient reports lower abdominal pain today and anorexia.   Objective: Blood pressure (!) 152/91, pulse (!) 117, temperature 98.5 F (36.9 C), resp. rate 20, height 5' 10 (1.778 m), weight 81.1 kg, SpO2 100%.  Intake/Output Summary (Last 24 hours) at 05/04/2024 0723 Last data filed at 05/04/2024 0530 Gross per 24 hour  Intake 1733.08 ml  Output 180 ml  Net 1553.08 ml   Examination: General: alert, no acute distress Cardiac: RRR, No murmurs, No gallops or rubs Respiratory: No rales, wheezing, rhonchi or rubs GI: mild tenderness consistent post op. Clean and dry dressing. Clearing serous fluid in drain Ext: No pitting leg edema bilaterally. 1+DP/PT pulse bilaterally. Musculoskeletal: No joint deformities, No joint redness or warmth, no limitation of ROM in spin. Skin: No rashes.  Neuro: awake, alert, baseline confused, no gross focal deficits  Assessment & Plan:  Ischemic bowel disease and closed loop obstruction of intestine (HCC): - S/p exploratory laparotomy, reduction of internal hernia, small bowel resection with ileocecectomy (110 cm), ileocolic anastomosis -on 10/30 - repeat CT scan 11/5 due to increased pain showed continuing ileus but overall healing well and no further  obstruction.  - restarting CLD today.  - surgical drain is draining appropriately. Surgical incision appears to be healing well.  - continue TPN - analgesia PRN and limiting opioids as much as possible - Continue IV Zosyn, leukocytosis slowly downtrending. Temperature has gradually uptrended. Given rectal tylenol  today - Surgery following, appreciate recs - Pain management, antiemetics prn - palliative has been consulted. HCPOA unable to be reached.   Hypertensive urgency - resolved HTN- On Lasix , Cozaar , clonidine  patch at home - Resume amlodipine , clonidine  patch  AKI on CKD stage 3a - resolved - s/p Iv fluids - Monitor UO, Cr  Hypokalemia Hypophosphatemia - Mag normal - Monitor and replete as needed, pharmacy consulted  Normocytic anemia - Hb dropped to 6.4, s/p 1u pRBC improved to 8.2, reported one episode of black BM 11/03. Has maintained steady since then.  - Anticoagulation held - Monitor Hb and transfuse if < 7   History of pulmonary embolism - Hold Eliquis, resume when ok per Surgery - Received 4000 units of Kcentra for reversal on 10/30   Chronic combined systolic and diastolic CHF (congestive heart failure) (HCC): 2D echo on 10/08/2023 showed EF of 40-45% with grade 2 diastolic dysfunction.  Patient does not have leg edema or JVD.  CHF seems to be concentrated. - Hold Lasix , euvolemic   Acquired hypothyroidism - change IV to po Levothyroxine  100mcg   CVA (cerebral vascular accident) (HCC) - Hold Crestor , Eliquis   CAD (coronary artery disease): Troponin negative x 2 -Hold Crestor  now   HLD (hyperlipidemia) -Hold Crestor    Depression -Hold Cymbalta  and olanzapine  due to QTc prolongation and npo  Dementia - Delirium precautions - Vit B12 1000 mcg IM once, po supplements once able to tolerate  --  PT/OT rec SNF  DVT prophylaxis: SCD's   Code Status: Full Code Disposition:  TBD  Consultants:  Treatment Team:  Consulting Physician: Desiderio Schanz,  MD  Procedures:  exploratory laparotomy, reduction of internal hernia, small bowel resection with cecectomy (110 cm), ileocolic anastomosis -on 10/30  Data Reviewed: I have personally reviewed following labs and imaging studies CBC: Recent Labs  Lab 04/27/24 1834 04/28/24 0817 04/29/24 0640 04/30/24 0456 05/01/24 0432 05/01/24 1952 05/02/24 0715 05/03/24 0448  WBC 12.4*   < > 22.2* 20.5* 15.2*  --  13.2* 11.0*  NEUTROABS 10.5*  --   --   --   --   --   --   --   HGB 12.6   < > 8.3* 7.2* 6.4* 8.2* 8.6* 9.0*  HCT 38.7   < > 25.5* 23.4* 19.9*  --  26.4* 27.5*  MCV 95.8   < > 98.5 101.7* 99.0  --  95.0 95.2  PLT 266   < > 183 163 168  --  211 220   < > = values in this interval not displayed.   Basic Metabolic Panel: Recent Labs  Lab 04/30/24 0456 05/01/24 0432 05/01/24 1952 05/01/24 2208 05/02/24 0715 05/03/24 0448 05/04/24 0522  NA 143 142  --   --  141 137 137  K 3.5 3.8 6.6* 3.6 3.6 4.0 3.5  CL 109 112*  --   --  106 108 108  CO2 27 24  --   --  24 23 23   GLUCOSE 127* 146*  --   --  149* 150* 143*  BUN 46* 31*  --   --  22 21 24*  CREATININE 1.49* 1.03*  --   --  0.81 0.77 0.85  CALCIUM  8.4* 7.9*  --   --  8.6* 8.3* 8.2*  MG 2.0 1.9  --   --  1.9 1.8 1.8  PHOS 2.3* 2.1* 5.3* 2.6 2.4* 2.8 2.8   GFR: Estimated Creatinine Clearance: 66.6 mL/min (by C-G formula based on SCr of 0.85 mg/dL). Liver Function Tests: Recent Labs  Lab 04/27/24 1834 05/01/24 0432 05/04/24 0522  AST 27 21 75*  ALT 21 11 96*  ALKPHOS 85 44 74  BILITOT 1.1 0.4 0.4  PROT 8.2* 5.8* 6.3*  ALBUMIN 4.0 2.2* 2.3*    LOS: 7 days  MDM: Patient is high risk for one or more organ failure.  They necessitate ongoing hospitalization for continued IV therapies and subsequent lab monitoring. Total time spent interpreting labs and vitals, reviewing the medical record, coordinating care amongst consultants and care team members, directly assessing and discussing care with the patient and/or family: 65  min Marien LITTIE Piety, MD [facility]  To contact the attending physician between 7A-7P please use Epic Chat. To contact the covering physician during after hours 7P-7A, please review Amion.  05/04/2024, 7:23 AM

## 2024-05-04 NOTE — Progress Notes (Signed)
 PHARMACY - TOTAL PARENTERAL NUTRITION CONSULT NOTE   Indication: Small bowel obstruction  Patient Measurements: Height: 5' 10 (177.8 cm) Weight: 81.5 kg (179 lb 10.8 oz) IBW/kg (Calculated) : 68.5 TPN AdjBW (KG): 79.6 Body mass index is 25.78 kg/m. Usual Weight: 75.8 kg (02/07/24)  Assessment:  Kylie [name] is a 68yoF that presented with upper abdominal pain. Patient's past medical history notable for dementia, HTN, HLD, sCHF with EF 40-45% (10/08/23), CVA, CKD-3a, depression, DM, PE on apixaban, and s/p right nephrectomy in 2009 for right renal cell carcinoma.  Glucose / Insulin :  BG 130-160s. Novolog  in the last 24 hours : 2 units.  Electrolytes:  Phos 4.9 > 2.3 > 2.1> 2.6>2.4>>2.8 Correctd Ca 9.3   (Ca 7.9,  Alb 2.2) Renal:  Scr 1.27 > 2.36 > 1.49> 1.03>>0.81 (BL ~0.9-1) Hepatic: 11/4 labs  Alk phos 44, AST 21, ALT 11, total bili 0.4, albumin 2.2 Intake / Output; MIVF:  +4.1 L (10/30 1900 to 10/31 0700) GI Imaging: 10/30 CT Angio Impression: Closed loop small bowel obstruction likely secondary to an internal hernia with findings concerning for developing bowel ischemia. Clinical correlation and surgical consult is advised. Status post right nephrectomy GI Surgeries / Procedures:  10/31: Exploratory Laparotomy, reduction of internal hernia, small bowel resection with cecectomy (length 110 cm), ileocolic anastomosis  ID 10/31 Zosyn>> (Day7)   Central access: yes TPN start date: 04/28/2024  Nutritional Goals: Goal TPN rate is 75 mL/hr (provides 108 g of protein and 2005 kcals per day)  RD Assessment: Estimated Needs Total Energy Estimated Needs: 2000-2300kcal/day Total Protein Estimated Needs: 100-115g/day Total Fluid Estimated Needs: 1.8-2.1L/day  Current Nutrition:  NPO sips w/ meds  Plan:  ---Continue TPN at 75 ml/hr (goal rate) at 1800. Hx of HF will keep fluid low.  --Order Magnesium  1g IV x 1 dose   ---Electrolytes in TPN (standard): Na 50mEq/L, K  46mEq/L, Ca 1mEq/L, Mg 81mEq/L, and Phos 15mmol/L. Cl:Ac 1:1 ---Add standard MVI and trace elements to TPN --add thiamine 100mg  IV daily x 7 days per dietician (start 11/3-11/9)- outside TPN ---Continue Sensitive  q6h SSI and adjust as needed  --Monitor TPN labs on Mon/Thurs, daily until stable  Estill CHRISTELLA Lutes, PharmD, BCPS Clinical Pharmacist 05/04/2024 9:10 AM

## 2024-05-04 NOTE — Consult Note (Signed)
 Consultation Note Date: 05/04/2024   Kylie [name] Name: Kylie [name]  DOB: Jul 10, 1955  MRN: [id number]  Age / Sex: 68 y.o., female  PCP: [facility] Referring Physician: Lenon Marien CROME, MD  Reason for Consultation: Establishing goals of care  HPI/Kylie [name] Profile: 68 y.o. female  with past medical history of dementia, HTN, HLD, sCHF with EF 40-45%, CVA, CKD-3a, depression, DM, PE on Xarelto , and s/p right nephrectomy in 2009 for right renal cell carcinoma admitted on 04/27/2024 with abdominal pain.  Workup revealed ischemic bowel disease. She is now s/p exploratory laparotomy 10/30 with small bowel resection with ileocecectomy, ileocolonic anastamosis and reduction of internal hernia. Course is complicated by post-op ileus and TPN requirement.  PMT consulted to discuss goals of care.  Clinical Assessment and Goals of Care: I have reviewed medical records including EPIC notes, labs and imaging, assessed the Kylie [name] and then spoke with Kylie [name]'s cousin, Kylie [name], to discuss diagnosis prognosis, GOC, EOL wishes, disposition and options.  While visiting Kylie [name], it is clear she is unable to independently participate in goals of care discussions.  She tells me she is not currently having any pain -upon chart review she received pain medicine less than 1 hour prior to my visit with her.  Kylie [name] does not remember why she is in the hospital.  The nurse techs at the bedside tell me she has been in and out of the bed frequently today.  Upon chart review there are places in the chart and documents where Kylie [name] is listed as POA.  However upon my review I could not find official HCPOA documentation.  I did find documentation that Kylie [name] is durable POA - not medical.  I also attempted to call Kylie [name]'s [facility] provider [name] however I did not get an answer or call back.   Upon discussion with Kylie [name] I asked him about this and he confirmed he is  only durable POA, not medical. He is Kylie [name]'s first cousin. He tells me Kylie [name]'s next of kin and therefore medical decision maker is her brother, Kylie [name]. He tells me they generally make decisions together and agree about Kylie [name]'s medical care. He tells me Kylie [name] can be difficult to get in touch with and therefore he has served as clinical research associate before.   I introduced Palliative Medicine as specialized medical care for people living with serious illness. It focuses on providing relief from the symptoms and stress of a serious illness. The goal is to improve quality of life for both the Kylie [name] and the family.  We discussed a brief life review of the Kylie [name]. Kylie [name] tells me he and they Kylie [name] grew up together and are very close.  Kylie [name] shares Kylie [name] has [facility] for medical care and stays at [facility]   Kylie [name] has many questions about Kylie [name]'s care and so we review her labs, vital signs, and medications.    We discussed Kylie [name]'s current illness and what it means in the larger context of Kylie [name]'s on-going co-morbidities.   We review her current dependence on IV nutrition and concern about nutrition moving forward.   I attempted to elicit values and goals of care important to the Kylie [name].  Kylie [name] tells me Kylie [name] has been clear in the past that she is open to all medical interventions offered. He tells me she would want to remain full code/full scope. He tells me her brother would support this as well. He tells me the only time they may change their minds on this is if  there were brain damage and in that event they would want a family meeting to decide how to move forward.   Discussed with Kylie [name] the importance of continued conversation with family and the medical providers regarding overall plan of care and treatment options, ensuring decisions are within the context of the Kylie [name]'s values and GOCs.    I attempted to call Kylie [name]'s brother as he is technically medical  decision maker and next of kin however he did not answer and I was unable to leave voicemail - I attempted x2. Kylie [name] shares he is difficult to get in touch with.   Questions and concerns were addressed. The family was encouraged to call with questions or concerns.  Primary Decision Maker NEXT OF KIN - brother Kylie [name] however he is difficult to get in touch with Kylie [name]'s cousin Kylie [name] is very involved and knowledgeable of Kylie [name]'s wishes - he is durable power of attorney, does not have HCPOA    SUMMARY OF RECOMMENDATIONS   - unable to get in touch with brother/next of kin, cousin reports Kylie [name] would desire full code/full scope care and her brother/next of kin would agree with this as well - Kylie [name] is established with [facility] - attempted to contact provider however no answer or call back yet - PMT will follow along  Code Status/Advance Care Planning: Full code   Symptom Management:  Per primary - symptoms controlled during my visit  Additional Recommendations (Limitations, Scope, Preferences): Full Scope Treatment     Primary Diagnoses: Present on Admission:  Ischemic bowel disease  Closed loop obstruction of intestine (HCC)  Dementia (HCC)  Acquired hypothyroidism  CVA (cerebral vascular accident) (HCC)  Essential hypertension  History of pulmonary embolism  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 The following aspects are pertinent.  Past Medical History:  Diagnosis Date   Arthritis    CAD in native artery    CKD (chronic kidney disease), stage III (HCC)    Dementia (HCC)    Depression    Hypertension    Hypothyroidism    Pulmonary  embolism (HCC) 2014   Renal cell carcinoma of right kidney (HCC) 2009   Sleep apnea    Solitary kidney, acquired 2009   right kidney removed   Stroke (HCC)    Type 2 diabetes mellitus (HCC)    no current medications   Social History   Socioeconomic History   Marital status: Single    Spouse name: Not on file   Number of children: Not on file   Years of education: Not on file   Highest education level: Not on file  Occupational History   Not on file  Tobacco Use   Smoking status: Never   Smokeless tobacco: Never  Vaping Use   Vaping status: Never Used  Substance and Sexual Activity   Alcohol use: Never   Drug use: Never   Sexual activity: Not Currently  Other Topics Concern   Not on file  Social History Narrative   Not on file   Social Drivers of Health   Financial Resource Strain: Low Risk  (09/05/2020)   [facility]   Overall Financial Resource Strain (CARDIA)    Difficulty of Paying Living  Expenses: Not very hard  Food Insecurity: Kylie [name] Unable To Answer (04/28/2024)   Hunger Vital Sign    Worried About Running Out of Food in the Last Year: Kylie [name] unable to answer    Ran Out of Food in the Last Year: Kylie [name] unable to answer  Transportation Needs: Kylie [name] Unable To Answer (04/28/2024)   PRAPARE - Transportation    Lack of Transportation (Medical): Kylie [name] unable to answer    Lack of Transportation (Non-Medical): Kylie [name] unable to answer  Physical Activity: Inactive (03/04/2021)   Received from [facility]   Exercise Vital Sign    On average, how many days per week do you engage in moderate to strenuous exercise (like a brisk walk)?: 0 days    On average, how many minutes do you engage in exercise at this level?: 0 min  Stress: Not on file  Social Connections: Kylie [name] Unable To Answer (04/28/2024)   Social Connection and Isolation Panel    Frequency of Communication with Friends and Family: Kylie [name] unable to answer    Frequency of Social  Gatherings with Friends and Family: Kylie [name] unable to answer    Attends Religious Services: Kylie [name] unable to answer    Active Member of Clubs or Organizations: Kylie [name] unable to answer    Attends Banker Meetings: Kylie [name] unable to answer    Marital Status: Kylie [name] unable to answer   No family history on file. Scheduled Meds:  amLODipine   10 mg Oral Daily   Chlorhexidine  Gluconate Cloth  6 each Topical Daily   cloNIDine   0.1 mg Transdermal Weekly   insulin  aspart  0-6 Units Subcutaneous Q6H   irbesartan  150 mg Oral Daily   levothyroxine   100 mcg Oral q morning   sodium chloride  flush  10-40 mL Intracatheter Q12H   thiamine (VITAMIN B1) injection  100 mg Intravenous Daily   Continuous Infusions:  sodium chloride  75 mL/hr at 05/04/24 0826   piperacillin-tazobactam (ZOSYN)  IV 3.375 g (05/04/24 0818)   TPN ADULT (ION) 75 mL/hr at 05/04/24 0100   TPN ADULT (ION)     PRN Meds:.acetaminophen , HYDROmorphone  (DILAUDID ) injection, HYDROmorphone  (DILAUDID ) injection, OLANZapine  zydis, sodium chloride  flush Allergies  Allergen Reactions   Aspirin     On warfarin   Bee Venom    Lisinopril Cough   Metformin Other (See Comments)    Nausea with significant weight loss   Spironolactone     Hyperkalemia   Wasp Venom Protein    Review of Systems  Unable to perform ROS: Dementia    Physical Exam Constitutional:      General: She is not in acute distress.    Appearance: She is ill-appearing.  Pulmonary:     Effort: Pulmonary effort is normal.  Skin:    General: Skin is warm and dry.  Neurological:     Mental Status: She is disoriented.     Comments: Doesn't remember she had surgery     Vital Signs: BP (!) 144/80 (BP Location: Left Arm)   Pulse (!) 101   Temp 100.1 F (37.8 C)   Resp 19   Ht 5' 10 (1.778 m)   Wt 81.5 kg   SpO2 100%   BMI 25.78 kg/m  Pain Scale: 0-10 POSS *See Group Information*: 1-Acceptable,Awake and alert Pain Score: 8    SpO2: SpO2:  100 % O2 Device:SpO2: 100 % O2 Flow Rate: .O2 Flow Rate (L/min): 6 L/min  IO: Intake/output summary:  Intake/Output Summary (Last 24 hours) at 05/04/2024 1414 Last  data filed at 05/04/2024 1300 Gross per 24 hour  Intake 1853.08 ml  Output 180 ml  Net 1673.08 ml    LBM: Last BM Date : 05/03/24 Baseline Weight: Weight: 79.5 kg Most recent weight: Weight: 81.5 kg     Palliative Assessment/Data: PPS 40%     *Please note that this is a verbal dictation therefore any spelling or grammatical errors are due to the Dragon Medical One system interpretation.   Time Total: 80 minutes Time spent includes: Detailed review of medical records (labs, imaging, vital signs), medically appropriate exam, discussion with treatment team, counseling and educating Kylie [name], family and/or staff, documenting clinical information, medication management and coordination of care.    Tobey Jama Barnacle, DNP, AGNP-C Palliative Medicine Team [phone] Pager: [phone]

## 2024-05-04 NOTE — Progress Notes (Signed)
 Occupational Therapy Treatment Patient Details Name: Kylie [name] MRN: [id number] DOB: 10/09/1955 Today's Date: 05/04/2024   History of present illness Pt is a 68 y/o F admitted on 04/27/24 after presenting with c/o abdominal pain. Imaging showed ischemic bowel disease. Pt is s/p ex lap & small bowel resection with cecectomy on 04/27/24. PMH: dementia, HTN, HLD, sCHF, CVA, CKD 3A, depression, DM, PE on xarelto , s/p R nephrectomy (2009) 2/2 renal cell carcinoma   OT comments  Pt is supine in bed on arrival. Easily arousable and agreeable to OT session. She is having increased abdominal pain and requesting meds from nurse at end of session. Pt performed bed mobility via log roll technique with Min A and verb and tactile cues. Pt required Min A for STS from EOB and low toilet with use of grab bar, able to stand with CGA from recliner with cues for safety. Max A for LB bathing of peri-region, Mod A for clothing management. UB bathing performed with Min A to supervision seated on toilet with cues for sequencing/technique. Pt ambulated within the room using RW with Min/CGA for safety/sequencing.  Pt returned to recliner with all needs in place and Kylie cont to require skilled acute OT services to maximize her safety and IND to return to PLOF.       If plan is discharge home, recommend the following:  A lot of help with bathing/dressing/bathroom;Supervision due to cognitive status;A little help with bathing/dressing/bathroom   Equipment Recommendations  Other (comment) (defer to next venue)    Recommendations for Other Services      Precautions / Restrictions Precautions Precautions: Fall Recall of Precautions/Restrictions: Impaired Precaution/Restrictions Comments: TPN, LLQ JP drain, log roll for comfort 2/2 abdominal incision Restrictions Weight Bearing Restrictions Per Provider Order: No       Mobility Bed Mobility Overal bed mobility: Needs Assistance Bed Mobility: Sidelying to Sit,  Rolling Rolling: Min assist, Used rails Sidelying to sit: Min assist       General bed mobility comments: cues for log roll technique, hands on assist for placement on bed rail    Transfers Overall transfer level: Needs assistance Equipment used: Rolling Kesinger (2 wheels) Transfers: Sit to/from Stand Sit to Stand: Min assist           General transfer comment: from bed and toilet, CGA to stand from recliner; ambulates in room using RW with MIN/CGA for safety     Balance Overall balance assessment: Needs assistance Sitting-balance support: Feet supported, Bilateral upper extremity supported Sitting balance-Leahy Scale: Good     Standing balance support: Bilateral upper extremity supported, During functional activity, Reliant on assistive device for balance Standing balance-Leahy Scale: Fair Standing balance comment: Min A/CGA for safety, RW use                           ADL either performed or assessed with clinical judgement   ADL Overall ADL's : Needs assistance/impaired     Grooming: Wash/dry face;Sitting;Set up Grooming Details (indicate cue type and reason): cues for sequencing/task performance Upper Body Bathing: Supervision/ safety;Sitting;Cueing for sequencing Upper Body Bathing Details (indicate cue type and reason): cues for sequencing/task performance seated on toilet Lower Body Bathing: Maximal assistance;Sit to/from stand Lower Body Bathing Details (indicate cue type and reason): for buttocks/peri region Upper Body Dressing : Moderate assistance;Sitting Upper Body Dressing Details (indicate cue type and reason): change gown     Toilet Transfer: Regular Toilet;Rolling Walen (2 wheels);Grab bars;Minimal assistance Toilet  Transfer Details (indicate cue type and reason): cues for hand placement and use of grab bar Toileting- Clothing Manipulation and Hygiene: Contact guard assist;Sit to/from stand Toileting - Clothing Manipulation Details  (indicate cue type and reason): standing peri-care after cont urine on toilet     Functional mobility during ADLs: Minimal assistance;Contact guard assist;Rolling Jantz (2 wheels) General ADL Comments: step by step multi modal cues for one step direction following throughout session during ADL performance and ambulation    Extremity/Trunk Assessment              Vision       Perception     Praxis     Communication Communication Communication: Impaired Factors Affecting Communication: Reduced clarity of speech   Cognition Arousal: Alert Behavior During Therapy: WFL for tasks assessed/performed                                 Following commands: Impaired Following commands impaired: Follows one step commands inconsistently, Only follows one step commands consistently      Cueing   Cueing Techniques: Verbal cues, Tactile cues, Visual cues  Exercises      Shoulder Instructions       General Comments abdominal bandage and JP drain intact pre/post session    Pertinent Vitals/ Pain       Pain Assessment Pain Assessment: Faces Faces Pain Scale: Hurts little more Pain Location: abdominal incision Pain Descriptors / Indicators: Grimacing, Guarding, Moaning Pain Intervention(s): Limited activity within patient's tolerance, Monitored during session, Patient requesting pain meds-RN notified, Repositioned  Home Living                                          Prior Functioning/Environment              Frequency  Min 2X/week        Progress Toward Goals  OT Goals(current goals can now be found in the care plan section)  Progress towards OT goals: Progressing toward goals  Acute Rehab OT Goals Patient Stated Goal: improve pain OT Goal Formulation: With patient Time For Goal Achievement: 05/13/24 Potential to Achieve Goals: Good  Plan      Co-evaluation                 AM-PAC OT 6 Clicks Daily Activity      Outcome Measure   Help from another person eating meals?: A Little (NPO) Help from another person taking care of personal grooming?: A Little Help from another person toileting, which includes using toliet, bedpan, or urinal?: A Lot Help from another person bathing (including washing, rinsing, drying)?: A Lot Help from another person to put on and taking off regular upper body clothing?: A Lot Help from another person to put on and taking off regular lower body clothing?: A Lot 6 Click Score: 14    End of Session Equipment Utilized During Treatment: Rolling Iannelli (2 wheels);Gait belt  OT Visit Diagnosis: Muscle weakness (generalized) (M62.81);Other abnormalities of gait and mobility (R26.89);Pain   Activity Tolerance Patient tolerated treatment well   Patient Left in chair;with call bell/phone within reach;with chair alarm set   Nurse Communication Mobility status        Time: 8949-8881 OT Time Calculation (min): 28 min  Charges: OT General Charges $OT Visit: 1 Visit OT Treatments $Self  Care/Home Management : 23-37 mins  Lorina Duffner, OTR/L  05/04/24, 1:15 PM   Lai Hendriks E Geana Walts 05/04/2024, 1:10 PM

## 2024-05-04 NOTE — Progress Notes (Signed)
 SLP Cancellation Note  Patient Details Name: Kylie [name] MRN: [id number] DOB: 1955-09-06   Cancelled treatment:       Reason Eval/Treat Not Completed: SLP screened, no needs identified, will sign off  Orders received for cognitive communication evaluation. Baseline dementia noted. Pt pleasant, conversant, and expressing 10/10 pain (location- abdomen). Attention/language intact. Pt oriented to self and easily redirected to external aids to bolster orientation to time and location. Given lack of acute neuro injury and current ability to express needs/wants, no acute SLP services indicated at this time. MD and RN aware.   Timofey Carandang Clapp, MS, CCC-SLP Speech Language Pathologist Rehab Services; [facility] [phone] (ascom)     [place] Semrad J Clapp 05/04/2024, 1:00 PM

## 2024-05-04 NOTE — Plan of Care (Signed)
  Problem: Nutritional: Goal: Maintenance of adequate nutrition will improve Outcome: Progressing   Problem: Health Behavior/Discharge Planning: Goal: Ability to manage health-related needs will improve Outcome: Progressing   Problem: Activity: Goal: Risk for activity intolerance will decrease Outcome: Progressing   Problem: Nutrition: Goal: Adequate nutrition will be maintained Outcome: Progressing

## 2024-05-05 DIAGNOSIS — K559 Vascular disorder of intestine, unspecified: Secondary | ICD-10-CM | POA: Diagnosis not present

## 2024-05-05 DIAGNOSIS — F03C0 Unspecified dementia, severe, without behavioral disturbance, psychotic disturbance, mood disturbance, and anxiety: Secondary | ICD-10-CM | POA: Diagnosis not present

## 2024-05-05 DIAGNOSIS — Z515 Encounter for palliative care: Secondary | ICD-10-CM | POA: Diagnosis not present

## 2024-05-05 DIAGNOSIS — E440 Moderate protein-calorie malnutrition: Secondary | ICD-10-CM | POA: Diagnosis not present

## 2024-05-05 LAB — GLUCOSE, CAPILLARY
Glucose-Capillary: 108 mg/dL — ABNORMAL HIGH (ref 70–99)
Glucose-Capillary: 119 mg/dL — ABNORMAL HIGH (ref 70–99)
Glucose-Capillary: 124 mg/dL — ABNORMAL HIGH (ref 70–99)
Glucose-Capillary: 142 mg/dL — ABNORMAL HIGH (ref 70–99)

## 2024-05-05 MED ORDER — TRACE MINERALS CU-MN-SE-ZN 300-55-60-3000 MCG/ML IV SOLN
INTRAVENOUS | Status: AC
  Filled 2024-05-05: qty 720

## 2024-05-05 MED ORDER — ENSURE PLUS HIGH PROTEIN PO LIQD
237.0000 mL | Freq: Two times a day (BID) | ORAL | Status: DC
  Administered 2024-05-05 – 2024-05-09 (×6): 237 mL via ORAL

## 2024-05-05 MED ORDER — APIXABAN 5 MG PO TABS
5.0000 mg | ORAL_TABLET | Freq: Two times a day (BID) | ORAL | Status: DC
  Administered 2024-05-05 – 2024-05-06 (×2): 5 mg via ORAL
  Filled 2024-05-05 (×2): qty 1

## 2024-05-05 MED ORDER — IRON SUCROSE 300 MG IVPB - SIMPLE MED
300.0000 mg | Freq: Once | Status: AC
  Administered 2024-05-05: 300 mg via INTRAVENOUS
  Filled 2024-05-05: qty 300

## 2024-05-05 NOTE — Progress Notes (Signed)
 Nutrition Follow-up  DOCUMENTATION CODES:   Non-severe (moderate) malnutrition in context of chronic illness  INTERVENTION:   -Continue TPN management per pharmacy -Monitor Mg, K, and Phos and replete as needed secondary to high refeeding risk -Continue daily weights -Recommend 100 mg thiamine daily x 7 days -Continue full liquid diet; RD will follow for diet advancement and add supplement regimen as appropriative  -Ensure Plus High Protein po BID, each supplement provides 350 kcal and 20 grams of protein  -Magic cup TID with meals, each supplement provides 290 kcal and 9 grams of protein  -Feeding assistance with meals   NUTRITION DIAGNOSIS:   Moderate Malnutrition related to chronic illness as evidenced by moderate fat depletion, moderate muscle depletion.  Ongoing   GOAL:   Patient will meet greater than or equal to 90% of their needs  Met with TPN  MONITOR:   Diet advancement, Labs, Weight trends, Skin, I & O's, Other (Comment) (TPN)  REASON FOR ASSESSMENT:   Consult New TPN/TNA  ASSESSMENT:   68 y.o. female with h/o dementia, HTN, HLD, CHF, CVA, CKD-3a, depression, DM, PE on Xarelto , RCC s/p right nephrectomy (2009), hypothryoidism, CAD and OSA who is admitted with SBO secondary to Internal hernia resulting in small bowel ischemia and necrosis now s/p exploratory laparotomy, reduction of internal hernia, small bowel resection with cecectomy (~110cm ileum along with IC valve) and ileocolic anastomosis 10/31.  10/31- s/p s/p exploratory laparotomy with small bowel resection, ileocecectomy, ileocolonic anastomosis and reduction of internal hernia, PICC placed, TPN initiated 11/1- NGT removed by patient, pulled out PICC line 11/2- PICC line replaced 11/3- IV thiamine ordered 11/6- advanced to clear liquid diet 11/7- advanced to full liquid diet, penrose drain removed  Reviewed I/O's: +2.8 L x 24 hours and +9.3 L since admission  Drain output: 50 ml x 24  hours  Patient lying in bed, unable to contribute history. Patient is having BMs.   She has been advanced to full liquid diet. No meal completion data available to assess at this time.   Patient remains on TPN at 75 ml/hr, which provides 2005 kcals and 108 grams protein, meeting 100% of estimated kcal and protein needs.   No weight loss since admission.   Palliative care following for goals of care discussions.   Labs reviewed: CBGS: 119-165 (inpatient orders for glycemic control are 0-6 units insulin  aspart every 6 hours).    Diet Order:   Diet Order             Diet full liquid Room service appropriate? Yes; Fluid consistency: Thin  Diet effective now                   EDUCATION NEEDS:   Not appropriate for education at this time  Skin:  Skin Assessment: Skin Integrity Issues: Skin Integrity Issues:: Incisions Incisions: closed abdomen  Last BM:  05/04/24 (type 6)  Height:   Ht Readings from Last 1 Encounters:  04/28/24 5' 10 (1.778 m)    Weight:   Wt Readings from Last 1 Encounters:  05/05/24 83.8 kg    Ideal Body Weight:  68.1 kg  BMI:  Body mass index is 26.51 kg/m.  Estimated Nutritional Needs:   Kcal:  2000-2300kcal/day  Protein:  100-115g/day  Fluid:  1.8-2.1L/day    Margery ORN, RD, LDN, CDCES Registered Dietitian III Certified Diabetes Care and Education Specialist If unable to reach this RD, please use RD Inpatient group chat on secure chat between hours of 8am-4 pm  daily

## 2024-05-05 NOTE — Progress Notes (Signed)
 Daily Progress Note   Patient Name: Kylie [name]       Date: 05/05/2024 DOB: [birth year]  Age: 68 y.o. MRN#: [id number] Attending Physician: Kylie [name] LABOR, MD Primary Care Physician: Kylie [name] Admit Date: 04/27/2024  Reason for Consultation/Follow-up: Establishing goals of care  Subjective: Complaining of abdominal pain - RN to bring medication  Length of Stay: 8  Current Medications: Scheduled Meds:   amLODipine   10 mg Oral Daily   Chlorhexidine  Gluconate Cloth  6 each Topical Daily   cloNIDine   0.1 mg Transdermal Weekly   insulin  aspart  0-6 Units Subcutaneous Q6H   irbesartan  150 mg Oral Daily   levothyroxine   100 mcg Oral q morning   sodium chloride  flush  10-40 mL Intracatheter Q12H   thiamine (VITAMIN B1) injection  100 mg Intravenous Daily    Continuous Infusions:  piperacillin-tazobactam (ZOSYN)  IV 3.375 g (05/05/24 1127)   TPN ADULT (ION) 75 mL/hr at 05/04/24 1905   TPN ADULT (ION)      PRN Meds: acetaminophen , HYDROmorphone  (DILAUDID ) injection, HYDROmorphone  (DILAUDID ) injection, OLANZapine  zydis, mouth rinse, sodium chloride  flush  Physical Exam Constitutional:      General: She is not in acute distress.    Appearance: She is ill-appearing.  Pulmonary:     Effort: Pulmonary effort is normal.  Skin:    General: Skin is warm and dry.  Neurological:     Mental Status: She is disoriented.             Vital Signs: BP 115/63   Pulse 81   Temp 99 F (37.2 C)   Resp 18   Ht 5' 10 (1.778 m)   Wt 83.8 kg   SpO2 100%   BMI 26.51 kg/m  SpO2: SpO2: 100 % O2 Device: O2 Device: Room Air O2 Flow Rate: O2 Flow Rate (L/min): 6 L/min  Intake/output summary:  Intake/Output Summary (Last 24 hours) at 05/05/2024 1129 Last data filed at 05/05/2024 0700 Gross  per 24 hour  Intake 2877.96 ml  Output 100 ml  Net 2777.96 ml   LBM: Last BM Date : 05/03/24 Baseline Weight: Weight: 79.5 kg Most recent weight: Weight: 83.8 kg       Palliative Assessment/Data: PPS 40%      Patient Active Problem List   Diagnosis Date Noted   Malnutrition of moderate degree 04/29/2024   Ischemic bowel disease 04/27/2024   Closed loop obstruction of intestine (HCC) 04/27/2024   Chronic combined systolic and diastolic CHF (congestive heart failure) (HCC) 04/27/2024   Leukocytosis 04/27/2024   HLD (hyperlipidemia) 04/27/2024   CAD (coronary artery disease) 04/27/2024   Depression 04/27/2024   Chronic kidney disease, stage 3a (HCC) 04/27/2024   Hypertensive urgency 04/27/2024   QT prolongation 04/27/2024   Hypokalemia 10/07/2023   Sepsis, severe, due to pneumonia (HCC) 10/06/2023   RSV (respiratory syncytial virus pneumonia) 10/06/2023   Acute respiratory failure with hypoxia (HCC) 10/06/2023   Rhabdomyolysis, traumatic 08/03/2023   Hypoglycemia 08/03/2023   S/p R nephrectomy for RCC 08/03/2023   History of pulmonary embolism 08/03/2023   History of CVA (cerebrovascular accident) 08/03/2023   Dementia (HCC)    Chronic anticoagulation due to history of pulmonary embolism  05/25/2017   CVA (cerebral vascular accident) (HCC) 05/25/2017   Hyperlipidemia 05/25/2017   Obesity (BMI 30.0-34.9) 10/22/2016   Type 2 diabetes mellitus without complication, without long-term current use of insulin  (HCC) 11/14/2014   Acquired hypothyroidism 02/19/2014   Allergic rhinitis 02/19/2014   Neuropathy 02/19/2014   Primary osteoarthritis of both knees 02/19/2014   Severe episode of recurrent major depressive disorder, without psychotic features (HCC) 02/19/2014   Vitamin B 12 deficiency 02/19/2014   Vitamin D  deficiency 02/19/2014   PE (pulmonary thromboembolism) (HCC) 10/03/2012   Malignant neoplasm of kidney (HCC) 03/21/2012   Gallstones without obstruction of  gallbladder 05/08/2009   Sleep apnea 07/05/2000   Essential hypertension 12/06/1996    Palliative Care Assessment & Plan   HPI: 68 y.o. female  with past medical history of dementia, HTN, HLD, sCHF with EF 40-45%, CVA, CKD-3a, depression, DM, PE on Xarelto , and s/p right nephrectomy in 2009 for right renal cell carcinoma admitted on 04/27/2024 with abdominal pain.  Workup revealed ischemic bowel disease. She is now s/p exploratory laparotomy 10/30 with small bowel resection with ileocecectomy, ileocolonic anastamosis and reduction of internal hernia. Course is complicated by post-op ileus and TPN requirement.  PMT consulted to discuss goals of care.   Assessment: Follow up today with Kylie [name]. No family at bedside. She is resting in bed. She tells me she is having abdominal pain. She is unable to independently participate in goals of care discussions.  Requested RN provide pain medication.  Call to cousin Kylie [name]. Of note he is durable POA, not HCPOA. Durable POA on file in medical record. He does frequently make medical decisions for Kylie [name] as her next of kin (brother Kylie [name]) is difficult to get in touch with.   Provided update to Kylie [name]. All questions answered. Goals remain for full scope of treatment. Full code.  Again attempted to call brother Kylie [name] to verify goals of care - no answer. Unable to leave voicemail.   Connected with her PACE provider regarding GOC discussions - discussions here consistent with outpatient discussions.   Recommendations/Plan: Ongoing difficulty speaking with next of kin/brother - have discussed with cousin Kylie [name] who is durable POA - requests full code/full scope care (PACE provider confirms these conversations are consistent with outpatient conversations) PMT will follow   Code Status: Full code   Care plan was discussed with PACE provider, patient's cousin, [name]  Thank you for allowing the Palliative Medicine Team to assist in the  care of this patient.   Total Time 30 minutes Prolonged Time Billed  no   Time spent includes: Detailed review of medical records (labs, imaging, vital signs), medically appropriate exam, discussion with treatment team, counseling and educating patient, family and/or staff, documenting clinical information, medication management and coordination of care.     *Please note that this is a verbal dictation therefore any spelling or grammatical errors are due to the Dragon Medical One system interpretation.  Tobey Jama Barnacle, DNP, [facility] Palliative Medicine Team Team Phone # [phone]  Pager [phone]

## 2024-05-05 NOTE — Progress Notes (Signed)
 PHARMACY - TOTAL PARENTERAL NUTRITION CONSULT NOTE   Indication: Small bowel obstruction  Patient Measurements: Height: 5' 10 (177.8 cm) Weight: 83.8 kg (184 lb 11.9 oz) IBW/kg (Calculated) : 68.5 TPN AdjBW (KG): 79.6 Body mass index is 26.51 kg/m. Usual Weight: 75.8 kg (02/07/24)  Assessment:  Kylie [name] is a 68yoF that presented with upper abdominal pain. Patient's past medical history notable for dementia, HTN, HLD, sCHF with EF 40-45% (10/08/23), CVA, CKD-3a, depression, DM, PE on apixaban, and s/p right nephrectomy in 2009 for right renal cell carcinoma.  Glucose / Insulin :  BG 130-160s. Novolog  in the last 24 hours : 2 units.  Electrolytes:  Phos 4.9 > 2.3 > 2.1> 2.6>2.4>>2.8 Correctd Ca 9.3   (Ca 7.9,  Alb 2.2) Renal:  Scr 1.27 > 2.36 > 1.49> 1.03>>0.81 (BL ~0.9-1) Hepatic: 11/4 labs  Alk phos 44, AST 21, ALT 11, total bili 0.4, albumin 2.2 Intake / Output; MIVF:  +4.1 L (10/30 1900 to 10/31 0700) GI Imaging: 10/30 CT Angio Impression: Closed loop small bowel obstruction likely secondary to an internal hernia with findings concerning for developing bowel ischemia. Clinical correlation and surgical consult is advised. Status post right nephrectomy GI Surgeries / Procedures:  10/31: Exploratory Laparotomy, reduction of internal hernia, small bowel resection with cecectomy (length 110 cm), ileocolic anastomosis  ID 10/31 Zosyn>> (Day8)   Central access: yes TPN start date: 04/28/2024  Nutritional Goals: Goal TPN rate is 75 mL/hr (provides 108 g of protein and 2005 kcals per day)  RD Assessment: Estimated Needs Total Energy Estimated Needs: 2000-2300kcal/day Total Protein Estimated Needs: 100-115g/day Total Fluid Estimated Needs: 1.8-2.1L/day  Current Nutrition:  NPO sips w/ meds  Plan:  ---Continue TPN at 75 ml/hr (goal rate) at 1800. Hx of HF will keep fluid low.  ---Electrolytes in TPN (standard): Na 50mEq/L, K 92mEq/L, Ca 69mEq/L, Mg 23mEq/L, and Phos  15mmol/L. Cl:Ac 1:1 ---Add standard MVI and trace elements to TPN --add thiamine 100mg  IV daily x 7 days per dietician (start 11/3-11/9)- outside TPN ---Continue Sensitive  q6h SSI and adjust as needed  --Monitor TPN labs on Mon/Thurs, daily until stable  Estill CHRISTELLA Lutes, PharmD, BCPS Clinical Pharmacist 05/05/2024 11:11 AM

## 2024-05-05 NOTE — Progress Notes (Signed)
 Physical Therapy Treatment Patient Details Name: Kylie [name] MRN: [id number] DOB: July 04, 1955 Today's Date: 05/05/2024   History of Present Illness Pt is a 68 y/o F admitted on 04/27/24 after presenting with c/o abdominal pain. Imaging showed ischemic bowel disease. Pt is s/p ex lap & small bowel resection with cecectomy on 04/27/24. PMH: dementia, HTN, HLD, sCHF, CVA, CKD 3A, depression, DM, PE on xarelto , s/p R nephrectomy (2009) 2/2 renal cell carcinoma    PT Comments  Progressive increase in gait distance and overall activity tolerance, completing distances up to 120' with RW, cga/min assist.  Generally limited by abdominal pain (meds requested during session), but eager to progress and recover function as able.     If plan is discharge home, recommend the following: A little help with walking and/or transfers;A little help with bathing/dressing/bathroom   Can travel by private vehicle     Yes  Equipment Recommendations  Rolling Gebel (2 wheels)    Recommendations for Other Services       Precautions / Restrictions Precautions Precautions: Fall Recall of Precautions/Restrictions: Impaired Precaution/Restrictions Comments: TPN, LLQ JP drain, log roll for comfort 2/2 abdominal incision Restrictions Weight Bearing Restrictions Per Provider Order: No     Mobility  Bed Mobility Overal bed mobility: Needs Assistance Bed Mobility: Supine to Sit Rolling: Min assist              Transfers Overall transfer level: Needs assistance Equipment used: Rolling Exton (2 wheels) Transfers: Sit to/from Stand, Bed to chair/wheelchair/BSC Sit to Stand: Min assist Stand pivot transfers: Min assist              Ambulation/Gait Ambulation/Gait assistance: Min assist Gait Distance (Feet): 120 Feet Assistive device: Rolling Padget (2 wheels)         General Gait Details: mild forward trunk flexion (due to pain with postural extension); slow, guarded gait pattern but no  buckling, LOB or significant safety concern noted   Stairs             Wheelchair Mobility     Tilt Bed    Modified Rankin (Stroke Patients Only)       Balance Overall balance assessment: Needs assistance Sitting-balance support: No upper extremity supported, Feet supported Sitting balance-Leahy Scale: Good     Standing balance support: Bilateral upper extremity supported Standing balance-Leahy Scale: Fair                              Communication    Cognition Arousal: Alert Behavior During Therapy: WFL for tasks assessed/performed   PT - Cognitive impairments: History of cognitive impairments                       PT - Cognition Comments: Per chart, pt with hx of dementia. Oriented to self, location and general situation; follows simple commands.  Limited recall of new information.        Cueing    Exercises      General Comments        Pertinent Vitals/Pain Pain Assessment Pain Assessment: Faces Faces Pain Scale: Hurts even more Pain Location: abdominal incision Pain Descriptors / Indicators: Grimacing, Guarding, Moaning Pain Intervention(s): Limited activity within patient's tolerance, Monitored during session, Repositioned, Patient requesting pain meds-RN notified    Home Living  Prior Function            PT Goals (current goals can now be found in the care plan section) Acute Rehab PT Goals Patient Stated Goal: decreased pain PT Goal Formulation: With patient Time For Goal Achievement: 05/13/24 Potential to Achieve Goals: Good Progress towards PT goals: Progressing toward goals    Frequency    Min 2X/week      PT Plan      Co-evaluation              AM-PAC PT 6 Clicks Mobility   Outcome Measure  Help needed turning from your back to your side while in a flat bed without using bedrails?: A Little Help needed moving from lying on your back to sitting on the side  of a flat bed without using bedrails?: A Little Help needed moving to and from a bed to a chair (including a wheelchair)?: A Little Help needed standing up from a chair using your arms (e.g., wheelchair or bedside chair)?: A Little Help needed to walk in hospital room?: A Little Help needed climbing 3-5 steps with a railing? : A Lot 6 Click Score: 17    End of Session Equipment Utilized During Treatment: Gait belt Activity Tolerance: Patient tolerated treatment well Patient left: in chair;with call bell/phone within reach;with chair alarm set;with nursing/sitter in room Nurse Communication: Mobility status PT Visit Diagnosis: Muscle weakness (generalized) (M62.81);Unsteadiness on feet (R26.81);Pain     Time: 1500-1526 PT Time Calculation (min) (ACUTE ONLY): 26 min  Charges:    $Gait Training: 8-22 mins $Therapeutic Activity: 8-22 mins PT General Charges $$ ACUTE PT VISIT: 1 Visit                     Folasade Mooty H. Delores, PT, DPT, NCS 05/05/24, 4:57 PM [phone]

## 2024-05-05 NOTE — Plan of Care (Signed)
   Problem: Coping: Goal: Ability to adjust to condition or change in health will improve Outcome: Progressing

## 2024-05-05 NOTE — Progress Notes (Signed)
 Progress Note   Patient: Kylie [name] FMW:[id number] DOB: 1955/12/15 DOA: 04/27/2024     8 DOS: the patient was seen and examined on 05/05/2024   Brief hospital course: 19F w h/o PE on Xarelto , s/p right nephrectomy in 2009 for right renal cell carcinoma presented with abdominal pain.  Positive ischemic bowel disease, s/p exploratory laparotomy and small bowel resection with cecectomy on 10/30 Surgery following,  On CLD also on TPN  Assessment and Plan: Ischemic bowel disease and closed loop obstruction of intestine [facility]): S/p exploratory laparotomy, reduction of internal hernia, small bowel resection with ileocecectomy (110 cm), ileocolic anastomosis on 10/30 Tolerating Clear liquids--> but didn't eat much today Surgical drain in place-minimal drainage noted. S Continue TPN Pain control as needed Continue IV Zosyn Surgery following, appreciate recs Palliative has been consulted. HCPOA unable to be reached Mobilize patient   Hypertensive urgency: resolved HTN- On Lasix , Cozaar , clonidine  patch at home Holding lasix  and cozaar  Continue amlodipine , clonidine  patch   AKI on CKD stage 3a: resolved with IVFs   Hypokalemia: resolved  Hypophosphatemia: resolved  Monitor and replete as needed, pharmacy consulted   Normocytic anemia: Hb improved to 8.4 - stable Was dropped to 6.4 on 11/3--> but improved and stable after 1u pRBC  Resumed eliquis F/u CBC in am Monitor Hb and transfuse if < 7   History of pulmonary embolism - Hold Eliquis, resume when ok per Surgery - Received 4000 units of Kcentra for reversal on 10/30   Chronic combined systolic and diastolic CHF (congestive heart failure) (HCC): 2D echo on 10/08/2023 showed EF of 40-45% with grade 2 diastolic dysfunction.  Patient does not have leg edema or JVD.  CHF seems to be concentrated. Hold Lasix , euvolemic   Acquired hypothyroidism: changed IV to po Levothyroxine  100mcg. Continue PO    CVA (cerebral vascular  accident) (HCC)L Held Crestor ,  Resumed Eliquis   CAD (coronary artery disease): Troponin negative x 2   HLD: held Crestor    Depression: Held Cymbalta  and olanzapine  due to QTc prolongation and npo   Dementia Delirium precautions Vit B12 1000 mcg IM once, po supplements once able to tolerate    DVT prophylaxis: SCD's   Code Status: Full Code Disposition:  PT/OT rec SNF     Subjective: Mild abdominal pian at site of procedure. Denies any nausea, vomiting, shortness of breath, chest pain, fever, chills  Physical Exam: Vitals:   05/04/24 1542 05/04/24 1953 05/05/24 0355 05/05/24 0819  BP: (!) 144/78 (!) 149/69 (!) 113/56 115/63  Pulse: 95 89 91 81  Resp: 19 17 20 18   Temp: 99.5 F (37.5 C) 99.5 F (37.5 C) 98.8 F (37.1 C) 99 F (37.2 C)  TempSrc:  Oral Oral   SpO2: 100% 100% 99% 100%  Weight:   83.8 kg   Height:       General:  NAD/NARD. Alert & Oriented x 3. Pleasant and Cooperative.   Eyes: PERRLA. EOMI.   ENMT:   No deformities.  MMM.   Head: Normocephalic/Atraumatic.   Neck: Supple. No cervical LAD.  No JVD.  Respiratory:  CTAB. No wheezing/rales/rhonchi appreciated.  Normal respiratory rate and rhythm.  Cardiovascular: Heart: RRR. Normal S1/S2. No murmurs/gallops/rubs. No LE edema.   Abdomen:  Soft. Tender at the site of surgery. Draining tube in place- intact with minimal drainage, Normoactive BS present.   Genitourinary: Genital exam deferred.  Musculoskeletal:  No deformities, induration, or joint effusion. Non-tender to palpation.   Skin: No rashes, nodules, or evidence of bleeding/bruising.  Neurologic: Grossly intact.  Psychologic: Normal affect/mood.   Data Reviewed:  Latest Reference Range & Units 05/04/24 05:22  Sodium 135 - 145 mmol/L 137  Potassium 3.5 - 5.1 mmol/L 3.5  Chloride 98 - 111 mmol/L 108  CO2 22 - 32 mmol/L 23  Glucose 70 - 99 mg/dL 856 (H)  BUN 8 - 23 mg/dL 24 (H)  Creatinine 9.55 - 1.00 mg/dL 9.14  Calcium  8.9 - 10.3 mg/dL 8.2 (L)   Anion gap 5 - 15  6  Phosphorus 2.5 - 4.6 mg/dL 2.8  Magnesium  1.7 - 2.4 mg/dL 1.8  Alkaline Phosphatase 38 - 126 U/L 74  Albumin 3.5 - 5.0 g/dL 2.3 (L)  AST 15 - 41 U/L 75 (H)  ALT 0 - 44 U/L 96 (H)  Total Protein 6.5 - 8.1 g/dL 6.3 (L)  Total Bilirubin 0.0 - 1.2 mg/dL 0.4  GFR, Estimated >39 mL/min >60  (H): Data is abnormally high (L): Data is abnormally low  Family Communication: none  Disposition: Status is: Inpatient   Planned Discharge Destination: Skilled nursing facility  Time spent: 35 minutes  Author: Terral DELENA Seashore, MD 05/05/2024 4:15 PM  For on call review [email or web address].

## 2024-05-05 NOTE — Progress Notes (Signed)
 POD7 Doing better Now more awake JP serous AVSS + CLD + bm \ PE NAD Abd: soft, staples in place. Penrose removed, no rebound or infection  A/p Doing well Continue tpn Fulls mobilize

## 2024-05-06 DIAGNOSIS — R627 Adult failure to thrive: Secondary | ICD-10-CM | POA: Diagnosis not present

## 2024-05-06 DIAGNOSIS — Z515 Encounter for palliative care: Secondary | ICD-10-CM | POA: Diagnosis not present

## 2024-05-06 DIAGNOSIS — K559 Vascular disorder of intestine, unspecified: Secondary | ICD-10-CM | POA: Diagnosis not present

## 2024-05-06 DIAGNOSIS — F03C0 Unspecified dementia, severe, without behavioral disturbance, psychotic disturbance, mood disturbance, and anxiety: Secondary | ICD-10-CM | POA: Diagnosis not present

## 2024-05-06 LAB — GLUCOSE, CAPILLARY
Glucose-Capillary: 133 mg/dL — ABNORMAL HIGH (ref 70–99)
Glucose-Capillary: 101 mg/dL — ABNORMAL HIGH (ref 70–99)
Glucose-Capillary: 126 mg/dL — ABNORMAL HIGH (ref 70–99)

## 2024-05-06 LAB — CBC
HCT: 23.1 % — ABNORMAL LOW (ref 36.0–46.0)
Hemoglobin: 7.4 g/dL — ABNORMAL LOW (ref 12.0–15.0)
MCH: 31.5 pg (ref 26.0–34.0)
MCHC: 32 g/dL (ref 30.0–36.0)
MCV: 98.3 fL (ref 80.0–100.0)
Platelets: 283 K/uL (ref 150–400)
RBC: 2.35 MIL/uL — ABNORMAL LOW (ref 3.87–5.11)
RDW: 14.3 % (ref 11.5–15.5)
WBC: 10.4 K/uL (ref 4.0–10.5)
nRBC: 0 % (ref 0.0–0.2)

## 2024-05-06 MED ORDER — MELATONIN 5 MG PO TABS
5.0000 mg | ORAL_TABLET | Freq: Every day | ORAL | Status: DC
  Administered 2024-05-09 – 2024-05-18 (×9): 5 mg via ORAL
  Filled 2024-05-06 (×12): qty 1

## 2024-05-06 MED ORDER — TRACE MINERALS CU-MN-SE-ZN 300-55-60-3000 MCG/ML IV SOLN
INTRAVENOUS | Status: AC
  Filled 2024-05-06: qty 720

## 2024-05-06 NOTE — Progress Notes (Signed)
 Daily Progress Note   Patient Name: Kylie [name]       Date: 05/06/2024 DOB: [birth year]  Age: 68 y.o. MRN#: [id number] Attending Physician: Monda Terral LABOR, MD Primary Care Physician: Care, [facility] Admit Date: 04/27/2024  Reason for Consultation/Follow-up: Establishing goals of care  Subjective: Complaining of moderate ongoing abdominal pain at surgical site; received pain medication shortly before I visited - RN aware. Not wanting much PO intake; attempted to help with some but she declined  Length of Stay: 9  Current Medications: Scheduled Meds:   amLODipine   10 mg Oral Daily   Chlorhexidine  Gluconate Cloth  6 each Topical Daily   cloNIDine   0.1 mg Transdermal Weekly   feeding supplement  237 mL Oral BID BM   insulin  aspart  0-6 Units Subcutaneous Q6H   irbesartan  150 mg Oral Daily   levothyroxine   100 mcg Oral q morning   melatonin  5 mg Oral QHS   sodium chloride  flush  10-40 mL Intracatheter Q12H   thiamine (VITAMIN B1) injection  100 mg Intravenous Daily    Continuous Infusions:  TPN ADULT (ION) 75 mL/hr at 05/05/24 1742    PRN Meds: acetaminophen , HYDROmorphone  (DILAUDID ) injection, HYDROmorphone  (DILAUDID ) injection, OLANZapine  zydis, mouth rinse, sodium chloride  flush  Physical Exam Constitutional:      General: She is not in acute distress.    Appearance: She is ill-appearing.  Pulmonary:     Effort: Pulmonary effort is normal.  Skin:    General: Skin is warm and dry.  Neurological:     Mental Status: She is disoriented.             Vital Signs: BP 139/63   Pulse 85   Temp 97.9 F (36.6 C)   Resp 18   Ht 5' 10 (1.778 m)   Wt 84.4 kg   SpO2 99%   BMI 26.70 kg/m  SpO2: SpO2: 99 % O2 Device: O2 Device: Room Air O2 Flow Rate: O2 Flow Rate (L/min): 6  L/min  Intake/output summary:  Intake/Output Summary (Last 24 hours) at 05/06/2024 1020 Last data filed at 05/06/2024 9366 Gross per 24 hour  Intake 1399.18 ml  Output 75 ml  Net 1324.18 ml   LBM: Last BM Date : 05/05/24 Baseline Weight: Weight: 79.5 kg Most recent weight: Weight: 84.4 kg       Palliative Assessment/Data: PPS 40%      Patient Active Problem List   Diagnosis Date Noted   Malnutrition of moderate degree 04/29/2024   Ischemic bowel disease 04/27/2024   Closed loop obstruction of intestine (HCC) 04/27/2024   Chronic combined systolic and diastolic CHF (congestive heart failure) (HCC) 04/27/2024   Leukocytosis 04/27/2024   HLD (hyperlipidemia) 04/27/2024   CAD (coronary artery disease) 04/27/2024   Depression 04/27/2024   Chronic kidney disease, stage 3a (HCC) 04/27/2024   Hypertensive urgency 04/27/2024   QT prolongation 04/27/2024   Hypokalemia 10/07/2023   Sepsis, severe, due to pneumonia (HCC) 10/06/2023   RSV (respiratory syncytial virus pneumonia) 10/06/2023   Acute respiratory failure with hypoxia (HCC) 10/06/2023   Rhabdomyolysis, traumatic 08/03/2023   Hypoglycemia 08/03/2023   S/p R nephrectomy for RCC 08/03/2023   History of pulmonary embolism  08/03/2023   History of CVA (cerebrovascular accident) 08/03/2023   Dementia (HCC)    Chronic anticoagulation due to history of pulmonary embolism 05/25/2017   CVA (cerebral vascular accident) (HCC) 05/25/2017   Hyperlipidemia 05/25/2017   Obesity (BMI 30.0-34.9) 10/22/2016   Type 2 diabetes mellitus without complication, without long-term current use of insulin  (HCC) 11/14/2014   Acquired hypothyroidism 02/19/2014   Allergic rhinitis 02/19/2014   Neuropathy 02/19/2014   Primary osteoarthritis of both knees 02/19/2014   Severe episode of recurrent major depressive disorder, without psychotic features (HCC) 02/19/2014   Vitamin B 12 deficiency 02/19/2014   Vitamin D  deficiency 02/19/2014   PE  (pulmonary thromboembolism) (HCC) 10/03/2012   Malignant neoplasm of kidney (HCC) 03/21/2012   Gallstones without obstruction of gallbladder 05/08/2009   Sleep apnea 07/05/2000   Essential hypertension 12/06/1996    Palliative Care Assessment & Plan   HPI: 68 y.o. female  with past medical history of dementia, HTN, HLD, sCHF with EF 40-45%, CVA, CKD-3a, depression, DM, PE on Xarelto , and s/p right nephrectomy in 2009 for right renal cell carcinoma admitted on 04/27/2024 with abdominal pain.  Workup revealed ischemic bowel disease. She is now s/p exploratory laparotomy 10/30 with small bowel resection with ileocecectomy, ileocolonic anastamosis and reduction of internal hernia. Course is complicated by post-op ileus and TPN requirement.  PMT consulted to discuss goals of care.   Assessment: Follow up today with Kylie [name]. No family at bedside. She is resting in bed. She tells me she is having abdominal pain. She is unable to independently participate in goals of care discussions. Not wanting much PO intake.  Assisted her with watching movie.   Call to cousin [name]. Of note he is durable POA, not HCPOA. Durable POA on file in medical record. He does frequently make medical decisions for Kylie [name] as her next of kin (brother [name]) is difficult to get in touch with.   Provided update to [name]. All questions answered. He asks that we review her meds and we did. Also reviewed labs. Goals remain for full scope of treatment. Full code.  Again attempted to call brother [name] to verify goals of care - no answer. Unable to leave voicemail.    Recommendations/Plan: Ongoing difficulty speaking with next of kin/brother - have discussed with cousin [name] who is durable POA - requests full code/full scope care ([facility] provider confirms these conversations are consistent with outpatient conversations) PMT will follow intermittently - please call with urgent needs   Code Status: Full  code   Care plan was discussed with patient's cousin and RN  Thank you for allowing the Palliative Medicine Team to assist in the care of this patient.   Total Time 30 minutes Prolonged Time Billed  no   Time spent includes: Detailed review of medical records (labs, imaging, vital signs), medically appropriate exam, discussion with treatment team, counseling and educating patient, family and/or staff, documenting clinical information, medication management and coordination of care.     *Please note that this is a verbal dictation therefore any spelling or grammatical errors are due to the Dragon Medical One system interpretation.  Tobey Jama Barnacle, DNP, [facility] Palliative Medicine Team Team Phone # [phone]  Pager [phone]

## 2024-05-06 NOTE — Progress Notes (Signed)
 Progress Note   Patient: Kylie [name] FMW:[id number] DOB: 02-Jan-1956 DOA: 04/27/2024     9 DOS: the patient was seen and examined on 05/06/2024   Brief hospital course: 45F w h/o PE on Xarelto , s/p right nephrectomy in 2009 for right renal cell carcinoma presented with abdominal pain.  Positive ischemic bowel disease, s/p exploratory laparotomy and small bowel resection with cecectomy on 10/30 Surgery following,  On CLD also on TPN  Assessment and Plan: Ischemic bowel disease and closed loop obstruction of intestine [facility]): S/p exploratory laparotomy, reduction of internal hernia, small bowel resection with ileocecectomy (110 cm), ileocolic anastomosis on 10/30 Tolerating Clear liquids--> but didn't eat much today Surgical drain in place-minimal drainage noted. S Discuss with pharmacy and start weaning TPN while we encourage oral intake in the next couple of days.  Pain control as needed Continue IV Zosyn Surgery following--appreciate Dr. Roslynn recommendations Palliative has been consulted. HCPOA unable to be reached Mobilize patient   Hypertensive urgency: resolved HTN- On Lasix , Cozaar , clonidine  patch at home Holding lasix  and cozaar  Continue amlodipine , clonidine  patch   AKI on CKD stage 3a: resolved with IVFs   Hypokalemia: resolved  Hypophosphatemia: resolved  Monitor and replete as needed, pharmacy consulted   Normocytic anemia: Hb dropped from 8.4 to 7.4 this AM Of note, when Hb was dropped to 6.4 on 11/3--> pt received 1u pRBCs Hold/ discontinued eliquis for now  F/u CBC in am Monitor Hb and transfuse if < 7   History of pulmonary embolism - Hold/ discontinued eliquis for now due to drop in Hb - Received 4000 units of Kcentra for reversal on 10/30   Chronic combined systolic and diastolic CHF (congestive heart failure) (HCC): 2D echo on 10/08/2023 showed EF of 40-45% with grade 2 diastolic dysfunction.  Patient does not have leg edema or JVD.  CHF seems to be  concentrated. Hold Lasix , euvolemic   Acquired hypothyroidism: changed IV to po Levothyroxine  100mcg. Continue PO    CVA (cerebral vascular accident) (HCC)L Held Crestor ,  Hold/ discontinued eliquis for now    CAD (coronary artery disease): Troponin negative x 2   HLD: held Crestor    Depression: Held Cymbalta  and olanzapine  due to QTc prolongation and npo   Dementia Delirium precautions Vit B12 1000 mcg IM once, po supplements once able to tolerate  Decreased sleep: Give melatonin 3mg  at bedtime   DVT prophylaxis: SCD's   Code Status: Full Code Disposition:  PT/OT rec SNF     Subjective: Reprts moderate abdominal pian at site of procedure.  States she didn't well last night. Asks me something to help with sleep Denies any nausea, vomiting, shortness of breath, chest pain, fever, chills  Physical Exam: Vitals:   05/05/24 1625 05/05/24 2019 05/06/24 0437 05/06/24 0751  BP: (!) 155/70 (!) 141/71 117/68 139/63  Pulse: (!) 110 82 83 85  Resp: 16 17 17 18   Temp: 97.9 F (36.6 C) 98.2 F (36.8 C) 98.5 F (36.9 C) 97.9 F (36.6 C)  TempSrc:  Oral Oral   SpO2: 99% 100% 100% 99%  Weight:   84.4 kg   Height:       General:  NAD/NARD. Alert & Oriented x 3. Pleasant and Cooperative.   Eyes: PERRLA. EOMI.   ENMT:   No deformities.  MMM.   Head: Normocephalic/Atraumatic.   Neck: Supple. No cervical LAD.  No JVD.  Respiratory:  CTAB. No wheezing/rales/rhonchi appreciated.  Normal respiratory rate and rhythm.  Cardiovascular: Heart: RRR. Normal S1/S2. No murmurs/gallops/rubs.  No LE edema.   Abdomen:  Soft. Tender at the site of surgery. Draining tube in place- intact with minimal drainage, Dressing over linear vertical incision--> intact. Normoactive BS present.   Genitourinary: Genital exam deferred.  Musculoskeletal:  No deformities, induration, or joint effusion. Non-tender to palpation.   Skin: No rashes, nodules, or evidence of bleeding/bruising. Neurologic: Grossly  intact.  Psychologic: Normal affect/mood.   Data Reviewed:  Latest Reference Range & Units 05/06/24 05:56  WBC 4.0 - 10.5 K/uL 10.4  RBC 3.87 - 5.11 MIL/uL 2.35 (L)  Hemoglobin 12.0 - 15.0 g/dL 7.4 (L)  HCT 63.9 - 53.9 % 23.1 (L)  MCV 80.0 - 100.0 fL 98.3  MCH 26.0 - 34.0 pg 31.5  MCHC 30.0 - 36.0 g/dL 67.9  RDW 88.4 - 84.4 % 14.3  Platelets 150 - 400 K/uL 283  nRBC 0.0 - 0.2 % 0.0  (L): Data is abnormally low  Latest Reference Range & Units 05/04/24 05:22  Sodium 135 - 145 mmol/L 137  Potassium 3.5 - 5.1 mmol/L 3.5  Chloride 98 - 111 mmol/L 108  CO2 22 - 32 mmol/L 23  Glucose 70 - 99 mg/dL 856 (H)  BUN 8 - 23 mg/dL 24 (H)  Creatinine 9.55 - 1.00 mg/dL 9.14  Calcium  8.9 - 10.3 mg/dL 8.2 (L)  Anion gap 5 - 15  6  Phosphorus 2.5 - 4.6 mg/dL 2.8  Magnesium  1.7 - 2.4 mg/dL 1.8  Alkaline Phosphatase 38 - 126 U/L 74  Albumin 3.5 - 5.0 g/dL 2.3 (L)  AST 15 - 41 U/L 75 (H)  ALT 0 - 44 U/L 96 (H)  Total Protein 6.5 - 8.1 g/dL 6.3 (L)  Total Bilirubin 0.0 - 1.2 mg/dL 0.4  GFR, Estimated >39 mL/min >60  (H): Data is abnormally high (L): Data is abnormally low  Family Communication: none  Disposition: Status is: Inpatient   Planned Discharge Destination: Skilled nursing facility  Time spent: 35 minutes  Author: Terral DELENA Seashore, MD 05/06/2024 9:55 AM  For on call review [email or web address].

## 2024-05-06 NOTE — Plan of Care (Signed)
  Problem: Education: Goal: Ability to describe self-care measures that may prevent or decrease complications (Diabetes Survival Skills Education) will improve Outcome: Not Progressing   Problem: Coping: Goal: Ability to adjust to condition or change in health will improve Outcome: Progressing   Problem: Pain Managment: Goal: General experience of comfort will improve and/or be controlled Outcome: Not Progressing

## 2024-05-06 NOTE — Progress Notes (Signed)
 POD8 Doing better still awake JP serous AVSS + full but not eatin 100% + bm  PE NAD Abd: soft, staples in place. JP serous, no rebound or infection   A/p Doing well Continue tpn for now as po intake is suboptimal Fulls Dc JP mobilize

## 2024-05-06 NOTE — Progress Notes (Signed)
 PHARMACY - TOTAL PARENTERAL NUTRITION CONSULT NOTE   Indication: Small bowel obstruction  Patient Measurements: Height: 5' 10 (177.8 cm) Weight: 84.4 kg (186 lb 1.1 oz) IBW/kg (Calculated) : 68.5 TPN AdjBW (KG): 79.6 Body mass index is 26.7 kg/m. Usual Weight: 75.8 kg (02/07/24)  Assessment:  Kylie [name] is a 68yoF that presented with upper abdominal pain. Patient's past medical history notable for dementia, HTN, HLD, sCHF with EF 40-45% (10/08/23), CVA, CKD-3a, depression, DM, PE on apixaban, and s/p right nephrectomy in 2009 for right renal cell carcinoma.  Glucose / Insulin :  BG 101-133. Novolog  in the last 24 hours : 0 units.  sSSI q6h Electrolytes:  Phos 4.9 > 2.3 > 2.1> 2.6>2.4>>2.8 Correctd Ca 9.3   (Ca 7.9,  Alb 2.2) Renal:  Scr 1.27 > 2.36 > 1.49> 1.03>>0.81 (BL ~0.9-1) Hepatic: 11/4 labs  Alk phos 44, AST 21, ALT 11, total bili 0.4, albumin 2.2 Intake / Output; MIVF:  +4.1 L (10/30 1900 to 10/31 0700) GI Imaging: 10/30 CT Angio Impression: Closed loop small bowel obstruction likely secondary to an internal hernia with findings concerning for developing bowel ischemia. Clinical correlation and surgical consult is advised. Status post right nephrectomy GI Surgeries / Procedures:  10/31: Exploratory Laparotomy, reduction of internal hernia, small bowel resection with cecectomy (length 110 cm), ileocolic anastomosis  ID 10/31 Zosyn>> (Day8)   Central access: yes TPN start date: 04/28/2024  Nutritional Goals: Goal TPN rate is 75 mL/hr (provides 108 g of protein and 2005 kcals per day)  RD Assessment: Estimated Needs Total Energy Estimated Needs: 2000-2300kcal/day Total Protein Estimated Needs: 100-115g/day Total Fluid Estimated Needs: 1.8-2.1L/day  Current Nutrition:  Full liquid 11/7  Plan:  ---Continue TPN at 75 ml/hr (goal rate) at 1800. Hx of HF will keep fluid low.  ---Electrolytes in TPN (standard): Na 50mEq/L, K 42mEq/L, Ca 37mEq/L, Mg 60mEq/L, and  Phos 15mmol/L. Cl:Ac 1:1 ---Add standard MVI and trace elements to TPN --add thiamine 100mg  IV daily x 7 days per dietician (start 11/3-11/9)- outside TPN ---Continue Sensitive  q6h SSI and adjust as needed  --Monitor TPN labs on Mon/Thurs, daily until stable  Valissa Lyvers A, PharmD Clinical Pharmacist 05/06/2024 12:13 PM

## 2024-05-07 DIAGNOSIS — K559 Vascular disorder of intestine, unspecified: Secondary | ICD-10-CM | POA: Diagnosis not present

## 2024-05-07 LAB — CBC
HCT: 17.3 % — ABNORMAL LOW (ref 36.0–46.0)
HCT: 17.7 % — ABNORMAL LOW (ref 36.0–46.0)
Hemoglobin: 5.6 g/dL — ABNORMAL LOW (ref 12.0–15.0)
Hemoglobin: 5.4 g/dL — ABNORMAL LOW (ref 12.0–15.0)
MCH: 34.6 pg — ABNORMAL HIGH (ref 26.0–34.0)
MCH: 31.4 pg (ref 26.0–34.0)
MCHC: 32.4 g/dL (ref 30.0–36.0)
MCHC: 30.5 g/dL (ref 30.0–36.0)
MCV: 106.8 fL — ABNORMAL HIGH (ref 80.0–100.0)
MCV: 102.9 fL — ABNORMAL HIGH (ref 80.0–100.0)
Platelets: 261 K/uL (ref 150–400)
Platelets: 273 K/uL (ref 150–400)
RBC: 1.62 MIL/uL — ABNORMAL LOW (ref 3.87–5.11)
RBC: 1.72 MIL/uL — ABNORMAL LOW (ref 3.87–5.11)
RDW: 15 % (ref 11.5–15.5)
RDW: 14.6 % (ref 11.5–15.5)
WBC: 12.7 K/uL — ABNORMAL HIGH (ref 4.0–10.5)
WBC: 13.8 K/uL — ABNORMAL HIGH (ref 4.0–10.5)
nRBC: 0.2 % (ref 0.0–0.2)
nRBC: 0 % (ref 0.0–0.2)

## 2024-05-07 LAB — HEMOGLOBIN: Hemoglobin: 6.6 g/dL — ABNORMAL LOW (ref 12.0–15.0)

## 2024-05-07 LAB — BASIC METABOLIC PANEL WITH GFR
Anion gap: 9 (ref 5–15)
Anion gap: 6 (ref 5–15)
BUN: 31 mg/dL — ABNORMAL HIGH (ref 8–23)
BUN: 31 mg/dL — ABNORMAL HIGH (ref 8–23)
CO2: 18 mmol/L — ABNORMAL LOW (ref 22–32)
CO2: 21 mmol/L — ABNORMAL LOW (ref 22–32)
Calcium: 8.1 mg/dL — ABNORMAL LOW (ref 8.9–10.3)
Calcium: 7.9 mg/dL — ABNORMAL LOW (ref 8.9–10.3)
Chloride: 110 mmol/L (ref 98–111)
Chloride: 114 mmol/L — ABNORMAL HIGH (ref 98–111)
Creatinine, Ser: 0.87 mg/dL (ref 0.44–1.00)
Creatinine, Ser: 0.8 mg/dL (ref 0.44–1.00)
GFR, Estimated: >60 mL/min (ref >60)
GFR, Estimated: >60 mL/min (ref >60)
Glucose, Bld: 908 mg/dL (ref 70–99)
Glucose, Bld: 151 mg/dL — ABNORMAL HIGH (ref 70–99)
Potassium: 6.8 mmol/L (ref 3.5–5.1)
Potassium: 4.2 mmol/L (ref 3.5–5.1)
Sodium: 137 mmol/L (ref 135–145)
Sodium: 141 mmol/L (ref 135–145)

## 2024-05-07 LAB — APTT: aPTT: 27 s (ref 24–36)

## 2024-05-07 LAB — HEMOGLOBIN AND HEMATOCRIT, BLOOD
HCT: 26.7 % — ABNORMAL LOW (ref 36.0–46.0)
Hemoglobin: 8.8 g/dL — ABNORMAL LOW (ref 12.0–15.0)

## 2024-05-07 LAB — GLUCOSE, CAPILLARY
Glucose-Capillary: 105 mg/dL — ABNORMAL HIGH (ref 70–99)
Glucose-Capillary: 118 mg/dL — ABNORMAL HIGH (ref 70–99)
Glucose-Capillary: 145 mg/dL — ABNORMAL HIGH (ref 70–99)
Glucose-Capillary: 176 mg/dL — ABNORMAL HIGH (ref 70–99)
Glucose-Capillary: 95 mg/dL (ref 70–99)

## 2024-05-07 LAB — PROTIME-INR
INR: 1.1 (ref 0.8–1.2)
Prothrombin Time: 14.5 s (ref 11.4–15.2)

## 2024-05-07 LAB — PHOSPHORUS
Phosphorus: 6.1 mg/dL — ABNORMAL HIGH (ref 2.5–4.6)
Phosphorus: 3.5 mg/dL (ref 2.5–4.6)

## 2024-05-07 LAB — PREPARE RBC (CROSSMATCH)

## 2024-05-07 LAB — MAGNESIUM: Magnesium: 2.3 mg/dL (ref 1.7–2.4)

## 2024-05-07 MED ORDER — TRACE MINERALS CU-MN-SE-ZN 300-55-60-3000 MCG/ML IV SOLN
INTRAVENOUS | Status: AC
  Filled 2024-05-07: qty 720

## 2024-05-07 MED ORDER — SODIUM CHLORIDE 0.9% IV SOLUTION: Freq: Once | INTRAVENOUS | Status: DC

## 2024-05-07 MED ORDER — SODIUM CHLORIDE 0.9 % IV BOLUS
500.0000 mL | Freq: Once | INTRAVENOUS | Status: AC
  Administered 2024-05-07: 500 mL via INTRAVENOUS

## 2024-05-07 MED ORDER — LORAZEPAM 2 MG/ML IJ SOLN
1.0000 mg | INTRAMUSCULAR | Status: AC | PRN
  Administered 2024-05-07 – 2024-05-08 (×2): 1 mg via INTRAVENOUS
  Filled 2024-05-07 (×2): qty 1

## 2024-05-07 NOTE — Progress Notes (Signed)
 PHARMACY - TOTAL PARENTERAL NUTRITION CONSULT NOTE   Indication: Small bowel obstruction  Patient Measurements: Height: 5' 10 (177.8 cm) Weight: 84.4 kg (186 lb 1.1 oz) IBW/kg (Calculated) : 68.5 TPN AdjBW (KG): 79.6 Body mass index is 26.7 kg/m. Usual Weight: 75.8 kg (02/07/24)  Assessment:  Kylie [name] is a 68yoF that presented with upper abdominal pain. Patient's past medical history notable for dementia, HTN, HLD, sCHF with EF 40-45% (10/08/23), CVA, CKD-3a, depression, DM, PE on apixaban, and s/p right nephrectomy in 2009 for right renal cell carcinoma.  Glucose / Insulin :  BG 101-133. Novolog  in the last 24 hours : 1 units.  sSSI q6h Electrolytes:  Labs drawn 11/9 am @ 0535 were drawn from TPN line- recheck of labs at 0857  Phos 4.9 > 2.3 > 2.1> 2.6>2.4>>2.8 Correctd Ca 9.3   (Ca 7.9,  Alb 2.3) Renal:  Scr 1.27 > 2.36 > 1.49> 1.03>>0.81 >0.80 (BL ~0.9-1) Hepatic: 11/4 labs  Alk phos 44, AST 21, ALT 11, total bili 0.4, albumin 2.2 Intake / Output; MIVF:   GI Imaging: 10/30 CT Angio Impression: Closed loop small bowel obstruction likely secondary to an internal hernia with findings concerning for developing bowel ischemia. Clinical correlation and surgical consult is advised. Status post right nephrectomy GI Surgeries / Procedures:  10/31: Exploratory Laparotomy, reduction of internal hernia, small bowel resection with cecectomy (length 110 cm), ileocolic anastomosis  ID 10/31 Zosyn>> (Day8)   Central access: yes TPN start date: 04/28/2024  Nutritional Goals: Goal TPN rate is 75 mL/hr (provides 108 g of protein and 2005 kcals per day)  RD Assessment: Estimated Needs Total Energy Estimated Needs: 2000-2300kcal/day Total Protein Estimated Needs: 100-115g/day Total Fluid Estimated Needs: 1.8-2.1L/day  Current Nutrition:  Full liquid 11/7  Plan:  ---Continue TPN at 75 ml/hr (goal rate) at 1800. Hx of HF will keep fluid low.  ---Electrolytes in TPN (standard):  Na 50mEq/L, K 39mEq/L, Ca 41mEq/L, Mg 60mEq/L, and Phos 15mmol/L.  --Cl:Ac 1:1 (watch) ---Add standard MVI and trace elements to TPN -- thiamine 100mg  IV daily x 7 days per dietician (completed) ---Continue Sensitive  q6h SSI and adjust as needed  --Monitor TPN labs on Mon/Thurs, daily until stable  Aracely Rickett A, PharmD Clinical Pharmacist 05/07/2024 11:47 AM

## 2024-05-07 NOTE — Progress Notes (Signed)
       CROSS COVER NOTE  NAME: Kylie [name] MRN: [id number] DOB : 02-14-1956    Concern as stated by nurse / staff   Patient had a bowel movement with bright dark blood and some clots . Patient hemoglobin was 7.4 , hematocrit 23.1 on 11/8 morning . She is here for Ischaemic bowel disease and had a bowel resection on 10/30. Vitals are stable . she is complaining of pain on the incision site . Dilaudid  0.6 mg was given      Pertinent findings on chart review: Brief review of chart -Positive ischemic bowel disease, s/p exploratory laparotomy and small bowel resection with cecectomy on 10/30  -Had transfusion of 1 unit PRBCs on 11/3 for Hb 6.4-Eliquis on hold    Latest Ref Rng & Units 05/06/2024    5:56 AM 05/04/2024    5:22 AM 05/03/2024    4:48 AM  CBC  WBC 4.0 - 10.5 K/uL 10.4  12.0  11.0   Hemoglobin 12.0 - 15.0 g/dL 7.4  8.4  9.0   Hematocrit 36.0 - 46.0 % 23.1  25.8  27.5   Platelets 150 - 400 K/uL 283  259  220      Patient Assessment   Assessment and  Interventions   Assessment:  Hematochezia in the setting of recent bowel resection for ischemic bowel Suspect ongoing blood loss anemia  Plan: Hemoglobin downtrending-Will check Q6 Will prepare 2 units PRBCs Fluid bolus Advised nursing to inform surgery

## 2024-05-07 NOTE — Progress Notes (Addendum)
       CROSS COVER NOTE  NAME: Kylie [name] MRN: [id number] DOB : [birth year]    Concern as stated by nurse / staff   Patient has a critical potassium of 6.8 and blood glucose of 908. Do you want me to put the patient on telemetry      Pertinent findings on chart review:   Patient Assessment  Nurse reports patient at baseline with baseline abd discomfort from recent surgery    05/07/2024    3:27 AM 05/06/2024    8:08 PM 05/06/2024    2:22 PM  Vitals with BMI  Systolic 114 115 868  Diastolic 66 61 63  Pulse 97 88 90     Assessment and  Interventions   Assessment:  SUSPECT LAB ERROR --hyperglycemia and hyperkalemia  Plan: Stat redraw ordered. Tele in the interim X X

## 2024-05-07 NOTE — Progress Notes (Signed)
 Patient had a bowel movement with bright dark blood and some clots . Patient hemoglobin was 7.4 , hematocrit 23.1 on 11/8 morning . She is here for Ischaemic bowel disease and had a bowel resection on 10/30. Vitals are stable . she is complaining of pain on the incision site . Dilaudid  0.6 mg was given   Dr Cleatus informed - see orders   Dr Jordis Cliche was informed on this number [phone] @0511 

## 2024-05-07 NOTE — Progress Notes (Signed)
 CC: POD # 9 R colectomy Subjective: Had melena after receiving 2 doses eliquis No more melena today  Did drop hb down to 5 Started transfusion  Objective: Vital signs in last 24 hours: Temp:  [98.2 F (36.8 C)-99.1 F (37.3 C)] 98.8 F (37.1 C) (11/09 1230) Pulse Rate:  [88-97] 95 (11/09 1230) Resp:  [14-19] 18 (11/09 1230) BP: (114-131)/(55-66) 116/62 (11/09 1230) SpO2:  [100 %] 100 % (11/09 1230) Last BM Date : 05/06/24  Intake/Output from previous day: 11/08 0701 - 11/09 0700 In: 985 [P.O.:200; I.V.:785] Out: 200 [Urine:200] Intake/Output this shift: No intake/output data recorded.  Physical exam: NAD Abd: soft, staples in place. JP serous, no rebound or infection   Lab Results: CBC  Recent Labs    05/07/24 0535 05/07/24 0857  WBC 12.7* 13.8*  HGB 5.6* 5.4*  HCT 17.3* 17.7*  PLT 261 273   BMET Recent Labs    05/07/24 0535 05/07/24 0857  NA 137 141  K 6.8* 4.2  CL 110 114*  CO2 18* 21*  GLUCOSE 908* 151*  BUN 31* 31*  CREATININE 0.87 0.80  CALCIUM  8.1* 7.9*   PT/INR Recent Labs    05/07/24 0855  LABPROT 14.5  INR 1.1   ABG No results for input(s): PHART, HCO3 in the last 72 hours.  Invalid input(s): PCO2, PO2  Studies/Results: No results found.  Anti-infectives: Anti-infectives (From admission, onward)    Start     Dose/Rate Route Frequency Ordered Stop   04/28/24 0600  piperacillin-tazobactam (ZOSYN) IVPB 3.375 g  Status:  Discontinued        3.375 g 12.5 mL/hr over 240 Minutes Intravenous Every 8 hours 04/28/24 0256 05/05/24 1340   04/27/24 2300  cefoTEtan (CEFOTAN) 2 g in sodium chloride  0.9 % 100 mL IVPB        2 g 200 mL/hr over 30 Minutes Intravenous On call to O.R. 04/27/24 2255 04/28/24 1319       Assessment/Plan: Difficult situation she is at high risks of deterioration and remains a full code despite poor prognosis and poor performance status\ Not good options for her If we could avoid a second surgery it  would be in her best interest, fortunately bleed seemed to have stopped and hopefully does not recur as we have stopped anticoagulation Bleed related to anticoagulation and likely at anastomosis which is impossible to reach endoscopically  SHe wont be able to be started back on anticoagulation as she bleeds RBC transfusion Continue tpn for now as po intake is suboptimal I personally spent a total of 35 minutes in the care of the patient today including performing a medically appropriate exam/evaluation, counseling and educating, placing orders, referring and communicating with other health care professionals, documenting clinical information in the EHR, independently interpreting and reviewing images studies and coordinating care.     Laneta Luna, MD, [facility]  05/07/2024

## 2024-05-07 NOTE — Progress Notes (Signed)
 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  CTA C/A/P showed closed-loop SBO with developing ischemia.  She received Kcentra for DOAC reversal and underwent emergent ex lap on 04/27/2024 with reduction of internal hernia, resection of approximately 110 cm distal small bowel with ileocecectomy, and ileocolic anastomosis.   Assessment and Plan:  # Ischemic bowel status post ex lap with SB resection plus ileocecectomy and ileocolic anastomosis (10/30) - Course complicated by post-op ileus - CT 11/5 c/w ileus, no obstruction/perforation/abscess - Transition to full liquid diet but very poor p.o. intake - Continue TPN via PICC line - IV Zosyn was stopped on 11/7 after 8 days - JP drain removed on 11/8 - Leukocytosis trending up, but afebrile and HDS. Will monitor and reconsider starting if clinically worsening  # Acute blood loss anemia # Hematochezia - She is status post transfusion of 1 unit pRBC on 11/3 for Hgb 6.4 - Received 2 doses of Eliquis 11/6 PM and 11/7 AM, held after drop in Hgb  - Noted to have 4 episodes of hematochezia starting overnight on 11/8 - Hgb 5.4 this morning - PT 14.5, INR 1.1, PTT 27 - Transfused 2 units pRBC - Surgery aware, suspect bleeding at anastomosis site, inaccessible endoscopically - Monitor with serial H/H  # History of PE s/p IVC filter - Eliquis held due to hematochezia and acute blood loss anemia  # Hypertensive urgency - resolved # Primary hypertension - BP stable - Continue amlodipine  10 mg daily and clonidine  patch  # [name] on CKD 3a - resolved - Cr at  baseline   #Chronic 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 [name] Penman. Discussed the setback the patient experienced today. He clarified that he is only her Financial POA and that her brother, Bonniejean Piano, is her Healthcare POA.  Disposition Plan: Pending clinical improvement PT Follow up Recs: Skilled Nursing-Short Term Rehab (<3 Hours/Day)05/05/2024 1655  Level of care: Telemetry  Consultants:  General surgery, palliative care  Procedures:  exploratory laparotomy with reduction of internal hernia, small bowel resection (~110 cm) with ileocecectomy and ileocolic anastomosis  Antimicrobials: IV Zosyn stopped on 11/7    Subjective: Had large bloody bowel movement overnight and several others during the day today. Hgb down to 5.4. Feels weak and still having significant abdominal pain. Poor PO intake.  Objective: Vitals:   05/07/24 1518 05/07/24 1547 05/07/24 1720 05/07/24 1726  BP: (!) 108/55 (!) 122/54 (!) 120/57 (!) 120/57  Pulse: 89 91 92 92  Resp: 18 18 18 18   Temp: 98.6 F (37 C) 98.7 F (37.1 C) 99.8 F (37.7 C) 99.8 F (37.7 C)  TempSrc:   Oral Oral  SpO2: 100% 100%  100%  Weight:      Height:        Intake/Output Summary (Last 24 hours) at 05/07/2024 1906 Last data filed at  05/07/2024 1720 Gross per 24 hour  Intake 1321.07 ml  Output 200 ml  Net 1121.07 ml   Filed Weights   05/04/24 0444 05/05/24 0355 05/06/24 0437  Weight: 81.5 kg 83.8 kg 84.4 kg    Examination:  General exam: Uncomfortable, ill-appearing Respiratory system: No increased WOB. CTAB. Cardiovascular system: +S1/S2, RRR. No JVD or murmurs. No pedal edema. Gastrointestinal  system: Soft, generalized tenderness to palpation. No masses felt. Normal bowel sounds. Midline abdominal incision c/d/i without signs of bleeding. Central nervous system: Alert and oriented only to person and situation. No focal neurological deficits. Extremities: 5/5 strength symmetrically Skin: No rashes, lesions or ulcers Psychiatry: Judgement and insight appear very limited. Mood & affect appropriate.   Data Reviewed: I have personally reviewed following labs and imaging studies  CBC: Recent Labs  Lab 05/03/24 0448 05/04/24 0522 05/06/24 0556 05/07/24 0535 05/07/24 0857 05/07/24 1518  WBC 11.0* 12.0* 10.4 12.7* 13.8*  --   HGB 9.0* 8.4* 7.4* 5.6* 5.4* 6.6*  HCT 27.5* 25.8* 23.1* 17.3* 17.7*  --   MCV 95.2 96.3 98.3 106.8* 102.9*  --   PLT 220 259 283 261 273  --    Basic Metabolic Panel: Recent Labs  Lab 05/01/24 0432 05/01/24 1952 05/02/24 0715 05/03/24 0448 05/04/24 0522 05/07/24 0535 05/07/24 0857 05/07/24 1020  NA 142  --  141 137 137 137 141  --   K 3.8   < > 3.6 4.0 3.5 6.8* 4.2  --   CL 112*  --  106 108 108 110 114*  --   CO2 24  --  24 23 23  18* 21*  --   GLUCOSE 146*  --  149* 150* 143* 908* 151*  --   BUN 31*  --  22 21 24* 31* 31*  --   CREATININE 1.03*  --  0.81 0.77 0.85 0.87 0.80  --   CALCIUM  7.9*  --  8.6* 8.3* 8.2* 8.1* 7.9*  --   MG 1.9  --  1.9 1.8 1.8 2.3  --   --   PHOS 2.1*   < > 2.4* 2.8 2.8 6.1*  --  3.5   < > = values in this interval not displayed.   GFR: Estimated Creatinine Clearance: 77.4 mL/min (by C-G formula based on SCr of 0.8 mg/dL). Liver Function Tests: Recent Labs  Lab 05/01/24 0432 05/04/24 0522  AST 21 75*  ALT 11 96*  ALKPHOS 44 74  BILITOT 0.4 0.4  PROT 5.8* 6.3*  ALBUMIN 2.2* 2.3*   No results for input(s): LIPASE, AMYLASE in the last 168 hours. No results for input(s): AMMONIA in the last 168 hours. Coagulation Profile: Recent Labs  Lab 05/07/24 0855  INR 1.1   Cardiac Enzymes: No results for  input(s): CKTOTAL, CKMB, CKMBINDEX, TROPONINI in the last 168 hours. BNP (last 3 results) No results for input(s): PROBNP in the last 8760 hours. HbA1C: No results for input(s): HGBA1C in the last 72 hours. CBG: Recent Labs  Lab 05/06/24 1721 05/07/24 0017 05/07/24 0640 05/07/24 1214 05/07/24 1658  GLUCAP 126* 145* 176* 118* 105*   Lipid Profile: No results for input(s): CHOL, HDL, LDLCALC, TRIG, CHOLHDL, LDLDIRECT in the last 72 hours. Thyroid Function Tests: No results for input(s): TSH, T4TOTAL, FREET4, T3FREE, THYROIDAB in the last 72 hours. Anemia Panel: No results for input(s): VITAMINB12, FOLATE, FERRITIN, TIBC, IRON, RETICCTPCT in the last 72 hours. Sepsis Labs: No results for input(s): PROCALCITON, LATICACIDVEN in the last 168 hours.  Recent  Results (from the past 240 hours)  MRSA Next Gen by PCR, Nasal     Status: None   Collection Time: 04/28/24  4:14 AM   Specimen: Nasal Mucosa; Nasal Swab  Result Value Ref Range Status   MRSA by PCR Next Gen NOT DETECTED NOT DETECTED Final    Comment: (NOTE) The GeneXpert MRSA Assay (FDA approved for NASAL specimens only), is one component of a comprehensive MRSA colonization surveillance program. It is not intended to diagnose MRSA infection nor to guide or monitor treatment for MRSA infections. Test performance is not FDA approved in patients less than 41 years old. Performed at [facility], [address], [place], [place] [address]      Radiology Studies: No results found.  Scheduled Meds:  sodium chloride    Intravenous Once   sodium chloride    Intravenous Once   amLODipine   10 mg Oral Daily   Chlorhexidine  Gluconate Cloth  6 each Topical Daily   cloNIDine   0.1 mg Transdermal Weekly   feeding supplement  237 mL Oral BID BM   insulin  aspart  0-6 Units Subcutaneous Q6H   irbesartan  150 mg Oral Daily   levothyroxine   100 mcg Oral q morning   melatonin  5  mg Oral QHS   sodium chloride  flush  10-40 mL Intracatheter Q12H   Continuous Infusions:  TPN ADULT (ION) 75 mL/hr at 05/07/24 1807     LOS:  LOS: 10 days   Time Spent: 50 minutes  Unresulted Labs (From admission, onward)     Start     Ordered   05/07/24 2100  Hemoglobin  Now then every 6 hours,   TIMED     Question:  Specimen collection method  Answer:  Unit=Unit collect   05/07/24 1812   05/01/24 0500  Comprehensive metabolic panel  (Standard TPN Labs (all labs to be drawn at 0500))  Every Mon,Thu (0500),   TIMED     Question:  Specimen collection method  Answer:  Lab=Lab collect   04/28/24 1458   05/01/24 0500  Magnesium   (Standard TPN Labs (all labs to be drawn at 0500))  Every Mon,Thu (0500),   TIMED     Question:  Specimen collection method  Answer:  Lab=Lab collect   04/28/24 1458   05/01/24 0500  Phosphorus  (Standard TPN Labs (all labs to be drawn at 0500))  Every Mon,Thu (0500),   TIMED     Question:  Specimen collection method  Answer:  Lab=Lab collect   04/28/24 1458   05/01/24 0500  Triglycerides  (Standard TPN Labs (all labs to be drawn at 0500))  Every Monday (0500),   TIMED     Question:  Specimen collection method  Answer:  Lab=Lab collect   04/28/24 1458             Philomina Leon Al-Sultani, MD [facility]  If 7PM-7AM, please contact night-coverage  05/07/2024, 7:06 PM

## 2024-05-08 ENCOUNTER — Other Ambulatory Visit: Payer: Self-pay

## 2024-05-08 DIAGNOSIS — K559 Vascular disorder of intestine, unspecified: Secondary | ICD-10-CM | POA: Diagnosis not present

## 2024-05-08 LAB — HEMOGLOBIN: Hemoglobin: 8 g/dL — ABNORMAL LOW (ref 12.0–15.0)

## 2024-05-08 LAB — TYPE AND SCREEN
ABO/RH(D): B POS
Antibody Screen: NEGATIVE
Unit division: 0
Unit division: 0

## 2024-05-08 LAB — MAGNESIUM: Magnesium: 2 mg/dL (ref 1.7–2.4)

## 2024-05-08 LAB — COMPREHENSIVE METABOLIC PANEL WITH GFR
ALT: 62 U/L — ABNORMAL HIGH (ref 0–44)
AST: 39 U/L (ref 15–41)
Albumin: 2.2 g/dL — ABNORMAL LOW (ref 3.5–5.0)
Alkaline Phosphatase: 80 U/L (ref 38–126)
Anion gap: 9 (ref 5–15)
BUN: 31 mg/dL — ABNORMAL HIGH (ref 8–23)
CO2: 20 mmol/L — ABNORMAL LOW (ref 22–32)
Calcium: 8.2 mg/dL — ABNORMAL LOW (ref 8.9–10.3)
Chloride: 116 mmol/L — ABNORMAL HIGH (ref 98–111)
Creatinine, Ser: 0.79 mg/dL (ref 0.44–1.00)
GFR, Estimated: >60 mL/min (ref >60)
Glucose, Bld: 90 mg/dL (ref 70–99)
Potassium: 3.6 mmol/L (ref 3.5–5.1)
Sodium: 145 mmol/L (ref 135–145)
Total Bilirubin: 0.7 mg/dL (ref 0.0–1.2)
Total Protein: 5.4 g/dL — ABNORMAL LOW (ref 6.5–8.1)

## 2024-05-08 LAB — GLUCOSE, CAPILLARY
Glucose-Capillary: 74 mg/dL (ref 70–99)
Glucose-Capillary: 78 mg/dL (ref 70–99)
Glucose-Capillary: 91 mg/dL (ref 70–99)
Glucose-Capillary: 95 mg/dL (ref 70–99)

## 2024-05-08 LAB — BPAM RBC
Blood Product Expiration Date: 202512062359
Blood Product Expiration Date: 202512072359
ISSUE DATE / TIME: 202511091204
ISSUE DATE / TIME: 202511091447
Unit Type and Rh: 7300
Unit Type and Rh: 7300

## 2024-05-08 LAB — CBC
HCT: 23.9 % — ABNORMAL LOW (ref 36.0–46.0)
Hemoglobin: 7.9 g/dL — ABNORMAL LOW (ref 12.0–15.0)
MCH: 31.7 pg (ref 26.0–34.0)
MCHC: 33.1 g/dL (ref 30.0–36.0)
MCV: 96 fL (ref 80.0–100.0)
Platelets: 261 K/uL (ref 150–400)
RBC: 2.49 MIL/uL — ABNORMAL LOW (ref 3.87–5.11)
RDW: 15.1 % (ref 11.5–15.5)
WBC: 14.4 K/uL — ABNORMAL HIGH (ref 4.0–10.5)
nRBC: 0 % (ref 0.0–0.2)

## 2024-05-08 LAB — BASIC METABOLIC PANEL WITH GFR
Anion gap: 9 (ref 5–15)
BUN: 30 mg/dL — ABNORMAL HIGH (ref 8–23)
CO2: 20 mmol/L — ABNORMAL LOW (ref 22–32)
Calcium: 8.1 mg/dL — ABNORMAL LOW (ref 8.9–10.3)
Chloride: 114 mmol/L — ABNORMAL HIGH (ref 98–111)
Creatinine, Ser: 0.81 mg/dL (ref 0.44–1.00)
GFR, Estimated: >60 mL/min (ref >60)
Glucose, Bld: 86 mg/dL (ref 70–99)
Potassium: 3.7 mmol/L (ref 3.5–5.1)
Sodium: 143 mmol/L (ref 135–145)

## 2024-05-08 LAB — PHOSPHORUS: Phosphorus: 3.1 mg/dL (ref 2.5–4.6)

## 2024-05-08 LAB — TRIGLYCERIDES: Triglycerides: 138 mg/dL (ref <150)

## 2024-05-08 MED ORDER — TRACE MINERALS CU-MN-SE-ZN 300-55-60-3000 MCG/ML IV SOLN
INTRAVENOUS | Status: AC
  Filled 2024-05-08: qty 720

## 2024-05-08 NOTE — Progress Notes (Signed)
 PHARMACY - TOTAL PARENTERAL NUTRITION CONSULT NOTE   Indication: Small bowel obstruction  Patient Measurements: Height: 5' 10 (177.8 cm) Weight: 84 kg (185 lb 3 oz) IBW/kg (Calculated) : 68.5 TPN AdjBW (KG): 79.6 Body mass index is 26.57 kg/m. Usual Weight: 75.8 kg (02/07/24)  Assessment:  Kylie [name] is a 68yoF that presented with upper abdominal pain. Patient's past medical history notable for dementia, HTN, HLD, sCHF with EF 40-45% (10/08/23), CVA, CKD-3a, depression, DM, PE on apixaban, and s/p right nephrectomy in 2009 for right renal cell carcinoma.  Glucose / Insulin : BG 74-151. (No SSI in last 24 hours)  Electrolytes: K 3.7, Mag 2.0, Phos 3.1, Corrected Ca 9.6 Renal:  Scr 0.87 > 0.80 > 0.79 Hepatic: Alk phos 80, AST 39, ALT 62, total bili 0.7, albumin 2.2 Intake / Output; MIVF: Net since admission: (+) 12.2L GI Imaging: 10/30 CT Angio Impression: Closed loop small bowel obstruction likely secondary to an internal hernia with findings concerning for developing bowel ischemia. Clinical correlation and surgical consult is advised. Status post right nephrectomy. 11/5 CTABD: Dilated small-bowel loops up to 4.3 cm without focal obstruction, consistent with ileus. Small amount of free fluid. GI Surgeries / Procedures:  10/31: Exploratory Laparotomy, reduction of internal hernia, small bowel resection with cecectomy (length 110 cm), ileocolic anastomosis  ID Zosyn 10/31 >>  Central access: yes TPN start date: 04/28/2024  Nutritional Goals: Goal TPN rate is 75 mL/hr (provides 108 g of protein and 2005 kcals per day)  RD Assessment: Estimated Needs Total Energy Estimated Needs: 2000-2300kcal/day Total Protein Estimated Needs: 100-115g/day Total Fluid Estimated Needs: 1.8-2.1L/day  Current Nutrition:  Full liquid 11/7  Plan:  Continue TPN at 75 ml/hr (goal rate) at 1800. Hx of HF will keep fluid low.  Electrolytes in TPN (standard): Na 50mEq/L, K 44mEq/L, Ca 68mEq/L, Mg  60mEq/L, and Phos 15mmol/L.  Cl:Ac 1:1 (watch) Add standard MVI and trace elements to TPN Thiamine 100mg  IV daily x 7 days per dietician (completed) Continue Sensitive  q6h SSI and adjust as needed  Monitor TPN labs on Mon/Thurs  Thank you for involving pharmacy in this patient's care.   Damien Napoleon, PharmD Clinical Pharmacist 05/08/2024 7:30 AM

## 2024-05-08 NOTE — Progress Notes (Signed)
 [name] SUR[facility]ICAL PROGRESS [facility]  Hospital Day(s): 11.   Post op day(s): 11 Days Post-Op.   Interval History:  Patient seen and examined  Nothing acute overnight She is sleeping this AM; arouses but not reliably Abdominal soreness No fever, chills, nausea, emesis  Slow rise in WBC: 14.4K Hgb to 7.8 (8.8 yesterday) Renal function normal; sCr - 0.81; UO - unmeasured No electrolyte derangements She is having bowel function; confirmed with RN staff  FLD; TPN  Vital signs in last 24 hours: [min-max] current  Temp:  [98 F (36.7 C)-99.8 F (37.7 C)] 98.4 F (36.9 C) (11/10 0553) Pulse Rate:  [89-95] 93 (11/10 0553) Resp:  [18-19] 18 (11/10 0553) BP: (108-132)/(54-69) 132/69 (11/10 0553) SpO2:  [100 %] 100 % (11/10 0553) Weight:  [84 kg] 84 kg (11/10 0500)     Height: 5' 10 (177.8 cm) Weight: 84 kg BMI (Calculated): 26.57   Intake/Output last 2 shifts:  11/09 0701 - 11/10 0700 In: 880 [P.O.:240; Blood:640] Out: -    Physical Exam:  Constitutional: alert, cooperative; NAD Respiratory: breathing non-labored at rest Cardiovascular: regular rate and sinus rhythm  Gastrointestinal: Soft, incisional soreness, distension seems improved, no rebounds/guarding  Integumentary: Laparotomy is intact with staples; no drainage   Labs:     Latest Ref Rng & Units 05/08/2024    6:38 AM 05/07/2024   10:49 PM 05/07/2024    3:18 PM  CBC  WBC 4.0 - 10.5 K/uL 14.4     Hemoglobin 12.0 - 15.0 g/dL 7.9  8.8  6.6   Hematocrit 36.0 - 46.0 % 23.9  26.7    Platelets 150 - 400 K/uL 261         Latest Ref Rng & Units 05/07/2024    8:57 AM 05/07/2024    5:35 AM 05/04/2024    5:22 AM  CMP  Glucose 70 - 99 mg/dL 848  091  856   BUN 8 - 23 mg/dL 31  31  24    Creatinine 0.44 - 1.00 mg/dL 9.19  9.12  9.14   Sodium 135 - 145 mmol/L 141  137  137   Potassium 3.5 - 5.1 mmol/L 4.2  6.8  3.5   Chloride 98 - 111 mmol/L 114  110  108   CO2 22 - 32 mmol/L 21  18  23    Calcium  8.9 - 10.3  mg/dL 7.9  8.1  8.2   Total Protein 6.5 - 8.1 g/dL   6.3   Total Bilirubin 0.0 - 1.2 mg/dL   0.4   Alkaline Phos 38 - 126 U/L   74   AST 15 - 41 U/L   75   ALT 0 - 44 U/L   96      Imaging studies:  No new imaging studies .   Assessment/Plan: 68 y.o. female 11 Days Post-Op s/p exploratory laparotomy with small bowel resection, ileocecectomy, ileocolonic anastomosis and reduction of internal hernia   - Difficult situation in patient with progressive deconditioning and poor functional status. I do not think she warrants any re-intervention at this time. She did respond to transfusion and hemodynamics are reasonable this AM.    - Would continue FLD for now; Unsure how reliable she is with PO intake  - Continue TPN; monitor diet advancement and tolerance prior to weaning   - Monitor H&H; responded to transfusion  - Continue surgical drain; monitor and record output - Monitor abdominal examination; on-going bowel function    - Pain control prn;  antiemetics prn - Mobilize; okay to work with therapies; on-board - Further management per primary service; we will follow    All of the above findings and recommendations were discussed with the patient, and the medical team, and all of patient's questions were answered to her expressed satisfaction.  -- Kylie Platt, PA-C [facility] 05/08/2024, 7:35 AM M-F: 7am - 4pm

## 2024-05-08 NOTE — Progress Notes (Signed)
 Occupational Therapy Treatment Patient Details Name: Kylie [name] MRN: [id number] DOB: 1955-11-24 Today's Date: 05/08/2024   History of present illness Pt is a 68 y/o F admitted on 04/27/24 after presenting with c/o abdominal pain. Imaging showed ischemic bowel disease. Pt is s/p ex lap & small bowel resection with cecectomy on 04/27/24. PMH: dementia, HTN, HLD, sCHF, CVA, CKD 3A, depression, DM, PE on xarelto , s/p R nephrectomy (2009) 2/2 renal cell carcinoma   OT comments  Pt is supine in bed on arrival. Easily arousable and agreeable to OT session. She is c/o abdominal pain throughout session and participates well. Continues to need Min A to CGA for most tasks including bed mobility, STS, ambulation, toileting tasks and standing grooming tasks with one step directions/cues for initiation and increased time. She fatigues easily and is perseverating on getting out of here. Pt returned to bed with all needs in place and will cont to require skilled acute OT services to maximize her safety and IND to return to PLOF.       If plan is discharge home, recommend the following:  A lot of help with bathing/dressing/bathroom;Supervision due to cognitive status;A little help with bathing/dressing/bathroom;A little help with walking and/or transfers   Equipment Recommendations  Other (comment) (defer to next venue)    Recommendations for Other Services      Precautions / Restrictions Precautions Precautions: Fall Recall of Precautions/Restrictions: Impaired Precaution/Restrictions Comments: log roll for comfort 2/2 abdominal incision Restrictions Weight Bearing Restrictions Per Provider Order: No       Mobility Bed Mobility Overal bed mobility: Needs Assistance Bed Mobility: Rolling Rolling: Min assist Sidelying to sit: Min assist       General bed mobility comments: cues for log roll technique    Transfers Overall transfer level: Needs assistance Equipment used: Rolling  [place] (2 wheels) Transfers: Sit to/from Stand Sit to Stand: Contact guard assist           General transfer comment: to stand from lowest bed height to RW and ambulate to bathroom and back with cues for initation and hand placement     Balance Overall balance assessment: Needs assistance Sitting-balance support: No upper extremity supported, Feet supported Sitting balance-Leahy Scale: Good     Standing balance support: Bilateral upper extremity supported Standing balance-Leahy Scale: Fair Standing balance comment: Min A/CGA for safety, RW use                           ADL either performed or assessed with clinical judgement   ADL Overall ADL's : Needs assistance/impaired Eating/Feeding: Set up;Bed level Eating/Feeding Details (indicate cue type and reason): drink from cup Grooming: Wash/dry face;Standing;Set up       Lower Body Bathing: Maximal assistance;Moderate assistance;Sit to/from stand Lower Body Bathing Details (indicate cue type and reason): for buttocks/peri region         Toilet Transfer: Regular Toilet;Rolling [place] (2 wheels);Grab bars;Minimal assistance;Contact guard assist Toilet Transfer Details (indicate cue type and reason): cues for hand placement and use of grab bar Toileting- Clothing Manipulation and Hygiene: Contact guard assist;Sit to/from stand Toileting - Clothing Manipulation Details (indicate cue type and reason): standing peri-care after cont urine on toilet     Functional mobility during ADLs: Minimal assistance;Contact guard assist;Rolling [place] (2 wheels) General ADL Comments: increased time to perform all tasks with step by step cues for direction following    Extremity/Trunk Assessment  Vision       Perception     Praxis     Communication     Cognition Arousal: Alert Behavior During Therapy: WFL for tasks assessed/performed                                 Following commands:  Impaired Following commands impaired: Only follows one step commands consistently      Cueing   Cueing Techniques: Verbal cues, Tactile cues, Visual cues  Exercises      Shoulder Instructions       General Comments      Pertinent Vitals/ Pain       Pain Assessment Pain Assessment: Faces Faces Pain Scale: Hurts even more Pain Location: abdominal incision Pain Descriptors / Indicators: Grimacing, Guarding, Moaning Pain Intervention(s): Limited activity within patient's tolerance, Monitored during session, Repositioned  Home Living                                          Prior Functioning/Environment              Frequency           Progress Toward Goals  OT Goals(current goals can now be found in the care plan section)  Progress towards OT goals: Progressing toward goals  Acute Rehab OT Goals Patient Stated Goal: improve pain and go home OT Goal Formulation: With patient Time For Goal Achievement: 05/13/24 Potential to Achieve Goals: Good  Plan      Co-evaluation                 AM-PAC OT 6 Clicks Daily Activity     Outcome Measure   Help from another person eating meals?: A Little Help from another person taking care of personal grooming?: A Little Help from another person toileting, which includes using toliet, bedpan, or urinal?: A Little Help from another person bathing (including washing, rinsing, drying)?: A Lot Help from another person to put on and taking off regular upper body clothing?: A Little Help from another person to put on and taking off regular lower body clothing?: A Lot 6 Click Score: 16    End of Session Equipment Utilized During Treatment: Rolling [place] (2 wheels);Gait belt  OT Visit Diagnosis: Muscle weakness (generalized) (M62.81);Other abnormalities of gait and mobility (R26.89);Pain   Activity Tolerance Patient tolerated treatment well   Patient Left with call bell/phone within reach;in  bed;with bed alarm set   Nurse Communication Mobility status        Time: 8847-8784 OT Time Calculation (min): 23 min  Charges: OT General Charges $OT Visit: 1 Visit OT Treatments $Self Care/Home Management : 23-37 mins  Jamesa Tedrick, OTR/L  05/08/24, 1:12 PM   Razi Hickle E Aury Scollard 05/08/2024, 1:10 PM

## 2024-05-08 NOTE — Progress Notes (Signed)
 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  CTA C/A/P showed closed-loop SBO with developing ischemia.  She received Kcentra for DOAC reversal and underwent emergent ex lap on 04/27/2024 with reduction of internal hernia, resection of approximately 110 cm distal small bowel with ileocecectomy, and ileocolic anastomosis.   Assessment and Plan:  # Ischemic bowel status post ex lap with SB resection plus ileocecectomy and ileocolic anastomosis (10/30) - Course complicated by post-op ileus - CT 11/5 c/w ileus, no obstruction/perforation/abscess - IV Zosyn was stopped on 11/7 after 8 days - JP drain removed on 11/8 - Leukocytosis trending up, but afebrile and HDS. Will monitor and reconsider restarting antibiotics if clinically worsening - Nutrition: On FLD but very poor p.o. intake. Pulled PICC line out overnight, recplaced this afternoon. TPN resumed  # Acute blood loss anemia # Hematochezia  - Status post transfusion of 1 unit pRBC on 11/3 for Hgb 6.4 - Received 2 doses of Eliquis 11/6 PM and 11/7 AM, held after drop in Hgb  - Had hematochezia x 5 episodes on 11/9 with Hgb drop to 5.4 that morning. Transfused 2 units pRBC. Surgery aware, suspect bleeding at anastomosis site, inaccessible endoscopically - 2 x hematochezia overnight then brown stool this morning - Hgb 8.0 today - Monitor with serial H/H - Continue holding Eliquis  # History of PE s/p IVC filter - Eliquis held due to hematochezia and acute blood loss anemia  # Hypertensive urgency - resolved # Primary hypertension - BP stable -  Continue amlodipine  10 mg daily and clonidine  patch  # AKI on CKD 3a - resolved - Cr at baseline   #Chronic HFmrEF (LVEF 40-45%) - Clinically euvolemic - Holding Lasix   # Hypothyroidism  - Continue PO Levothyroxine  100 mcg   # Dementia - Delirium precautions  DVT prophylaxis: Place and maintain sequential compression device Start: 04/28/24 1722 SCDs Start: 04/27/24 2311   Code Status:   Code Status: Full Code  Family Communication: Spoke with patient's son Mr. Carlynn. He stated the family will be coming tomorrow to see her and discuss GOC  Disposition Plan: Pending clinical improvement PT Follow up Recs: Skilled Nursing-Short Term Rehab (<3 Hours/Day)05/05/2024 1655  Level of care: Telemetry  Consultants:  General surgery, palliative care  Procedures:  exploratory laparotomy with reduction of internal hernia, small bowel resection (~110 cm) with ileocecectomy and ileocolic anastomosis  Antimicrobials: IV Zosyn stopped on 11/7    Subjective: Two bloody bowel movements overnight, then brown one this morning.  Also, pulled out PICC line overnight.  Continues to report severe abdominal pain today.  Ate very little food.  Denies nausea, vomiting, fevers, chills.  Objective: Vitals:   05/07/24 2136 05/08/24 0500 05/08/24 0553 05/08/24 0831  BP: 108/64  132/69 130/69  Pulse: 93  93 91  Resp: 18  18 18   Temp: 98.3 F (36.8 C)  98.4 F (36.9 C) 98.6 F (37 C)  TempSrc: Oral     SpO2: 100%  100% 100%  Weight:  84 kg    Height:        Intake/Output Summary (Last 24 hours) at 05/08/2024 1155 Last data filed at  05/07/2024 1943 Gross per 24 hour  Intake 880 ml  Output --  Net 880 ml   Filed Weights   05/05/24 0355 05/06/24 0437 05/08/24 0500  Weight: 83.8 kg 84.4 kg 84 kg    Examination:  General exam: Uncomfortable, ill-appearing Respiratory system: No increased WOB. CTAB. Cardiovascular system: +S1/S2, RRR. No JVD or murmurs. No pedal edema. Gastrointestinal  system: Soft, generalized tenderness to palpation. Midline abdominal incision c/d/i without signs of bleeding. Central nervous system: Alert and oriented only to person and situation. No focal neurological deficits. Extremities: 5/5 strength symmetrically Skin: No rashes, lesions or ulcers Psychiatry: Judgement and insight appear very limited. Mood & affect appropriate.   Data Reviewed: I have personally reviewed following labs and imaging studies  CBC: Recent Labs  Lab 05/04/24 0522 05/06/24 0556 05/07/24 0535 05/07/24 0857 05/07/24 1518 05/07/24 2249 05/08/24 0638 05/08/24 1001  WBC 12.0* 10.4 12.7* 13.8*  --   --  14.4*  --   HGB 8.4* 7.4* 5.6* 5.4* 6.6* 8.8* 7.9* 8.0*  HCT 25.8* 23.1* 17.3* 17.7*  --  26.7* 23.9*  --   MCV 96.3 98.3 106.8* 102.9*  --   --  96.0  --   PLT 259 283 261 273  --   --  261  --    Basic Metabolic Panel: Recent Labs  Lab 05/02/24 0715 05/03/24 0448 05/04/24 0522 05/07/24 0535 05/07/24 0857 05/07/24 1020 05/08/24 0638 05/08/24 0733  NA 141 137 137 137 141  --  145 143  K 3.6 4.0 3.5 6.8* 4.2  --  3.6 3.7  CL 106 108 108 110 114*  --  116* 114*  CO2 24 23 23  18* 21*  --  20* 20*  GLUCOSE 149* 150* 143* 908* 151*  --  90 86  BUN 22 21 24* 31* 31*  --  31* 30*  CREATININE 0.81 0.77 0.85 0.87 0.80  --  0.79 0.81  CALCIUM  8.6* 8.3* 8.2* 8.1* 7.9*  --  8.2* 8.1*  MG 1.9 1.8 1.8 2.3  --   --  2.0  --   PHOS 2.4* 2.8 2.8 6.1*  --  3.5 3.1  --    GFR: Estimated Creatinine Clearance: 76.2 mL/min (by C-G formula based on SCr of 0.81 mg/dL). Liver Function Tests: Recent Labs  Lab 05/04/24 0522 05/08/24 0638  AST 75* 39  ALT 96* 62*  ALKPHOS 74 80  BILITOT 0.4 0.7  PROT 6.3* 5.4*  ALBUMIN 2.3* 2.2*   No results for input(s): LIPASE, AMYLASE in the last 168 hours. No results for input(s): AMMONIA in the last 168 hours. Coagulation Profile: Recent Labs  Lab 05/07/24 0855  INR 1.1   Cardiac Enzymes: No results for input(s):  CKTOTAL, CKMB, CKMBINDEX, TROPONINI in the last 168 hours. BNP (last 3 results) No results for input(s): PROBNP in the last 8760 hours. HbA1C: No results for input(s): HGBA1C in the last 72 hours. CBG: Recent Labs  Lab 05/07/24 1214 05/07/24 1658 05/07/24 2132 05/08/24 0027 05/08/24 0606  GLUCAP 118* 105* 95 78 74   Lipid Profile: Recent Labs    05/08/24 0638  TRIG 138   Thyroid Function Tests: No results for input(s): TSH, T4TOTAL, FREET4, T3FREE, THYROIDAB in the last 72 hours. Anemia Panel: No results for input(s): VITAMINB12, FOLATE, FERRITIN, TIBC, IRON, RETICCTPCT in the last 72 hours. Sepsis Labs: No results for input(s): PROCALCITON, LATICACIDVEN in the last 168 hours.  No results found for this or any previous visit (from the past  240 hours).    Radiology Studies: No results found.  Scheduled Meds:  sodium chloride    Intravenous Once   sodium chloride    Intravenous Once   amLODipine   10 mg Oral Daily   Chlorhexidine  Gluconate Cloth  6 each Topical Daily   cloNIDine   0.1 mg Transdermal Weekly   feeding supplement  237 mL Oral BID BM   insulin  aspart  0-6 Units Subcutaneous Q6H   irbesartan  150 mg Oral Daily   levothyroxine   100 mcg Oral q morning   melatonin  5 mg Oral QHS   sodium chloride  flush  10-40 mL Intracatheter Q12H   Continuous Infusions:  TPN ADULT (ION) Stopped (05/07/24 1945)   TPN ADULT (ION)       LOS:  LOS: 11 days   Time Spent: 40 minutes  Unresulted Labs (From admission, onward)     Start     Ordered   05/08/24 0500  CBC  Daily,   R     Question:  Specimen collection method  Answer:  Lab=Lab collect   05/07/24 1952   05/01/24 0500  Comprehensive metabolic panel  (Standard TPN Labs (all labs to be drawn at 0500))  Every Mon,Thu (0500),   TIMED     Question:  Specimen collection method  Answer:  Lab=Lab collect   04/28/24 1458   05/01/24 0500  Magnesium   (Standard TPN Labs (all labs to be  drawn at 0500))  Every Mon,Thu (0500),   TIMED     Question:  Specimen collection method  Answer:  Lab=Lab collect   04/28/24 1458   05/01/24 0500  Phosphorus  (Standard TPN Labs (all labs to be drawn at 0500))  Every Mon,Thu (0500),   TIMED     Question:  Specimen collection method  Answer:  Lab=Lab collect   04/28/24 1458   05/01/24 0500  Triglycerides  (Standard TPN Labs (all labs to be drawn at 0500))  Every Monday (0500),   TIMED     Question:  Specimen collection method  Answer:  Lab=Lab collect   04/28/24 1458             Christofer Shen Al-Sultani, MD [facility]  If 7PM-7AM, please contact night-coverage  05/08/2024, 11:55 AM

## 2024-05-08 NOTE — Plan of Care (Signed)
 PMT shadowing.  Patient continuing full liquid diet and attempts at weaning TPN.  Patient currently full code and full scope.  Per notes, this decision was made by family, and was confirmed by [facility] provider who has completed outpatient conversations.  PMT will continue to shadow.

## 2024-05-08 NOTE — Plan of Care (Signed)
 Patient is alert and oriented to self only. Clear lung sounds, on room air no noted cough. Abdomen soft, last bowel movement 11/10, +bowel sounds. Patient is continuing to have dark bloody bowel movements- Q6hr H&H (last hemoglobin 8.8), patient received 2units of PRBC on 11/9. Voiding without any difficulty. TPN stopped at 1945 as patient self removed PICC line at Dr. Cleatus notified, no noted bleeding at site of removal. Tolerating diet, Q6hr glucose checks. Ambulating 1 assist with front wheel [place] out of bed to bedside commode. Hourly rounding performed, fall precautions maintained, and call bell within reach.  At 1945 patient removed at PICC line from right arm, and was agitated and took off telemetry box and threw it at this RN's head and stated my fucking stomach hurts and I want to go to the bathroom and no one listens to me in this school. RN attempted to reorient the patient and that there is no need to throw things and to become aggressive as I am here to assist you. This RN notified Dr. Cleatus and PRN ativan ordered with good affect.  Problem: Education: Goal: Ability to describe self-care measures that may prevent or decrease complications (Diabetes Survival Skills Education) will improve Outcome: Not Progressing Goal: Individualized Educational Video(s) Outcome: Not Progressing   Problem: Coping: Goal: Ability to adjust to condition or change in health will improve Outcome: Not Progressing   Problem: Fluid Volume: Goal: Ability to maintain a balanced intake and output will improve Outcome: Not Progressing   Problem: Health Behavior/Discharge Planning: Goal: Ability to identify and utilize available resources and services will improve Outcome: Not Progressing Goal: Ability to manage health-related needs will improve Outcome: Not Progressing   Problem: Metabolic: Goal: Ability to maintain appropriate glucose levels will improve Outcome: Not Progressing   Problem:  Nutritional: Goal: Maintenance of adequate nutrition will improve Outcome: Not Progressing Goal: Progress toward achieving an optimal weight will improve Outcome: Not Progressing   Problem: Skin Integrity: Goal: Risk for impaired skin integrity will decrease Outcome: Not Progressing   Problem: Tissue Perfusion: Goal: Adequacy of tissue perfusion will improve Outcome: Not Progressing   Problem: Education: Goal: Knowledge of General Education information will improve Description: Including pain rating scale, medication(s)/side effects and non-pharmacologic comfort measures Outcome: Not Progressing   Problem: Health Behavior/Discharge Planning: Goal: Ability to manage health-related needs will improve Outcome: Not Progressing   Problem: Clinical Measurements: Goal: Ability to maintain clinical measurements within normal limits will improve Outcome: Not Progressing Goal: Will remain free from infection Outcome: Not Progressing Goal: Diagnostic test results will improve Outcome: Not Progressing Goal: Respiratory complications will improve Outcome: Not Progressing Goal: Cardiovascular complication will be avoided Outcome: Not Progressing   Problem: Activity: Goal: Risk for activity intolerance will decrease Outcome: Not Progressing   Problem: Nutrition: Goal: Adequate nutrition will be maintained Outcome: Not Progressing   Problem: Coping: Goal: Level of anxiety will decrease Outcome: Not Progressing   Problem: Elimination: Goal: Will not experience complications related to bowel motility Outcome: Not Progressing Goal: Will not experience complications related to urinary retention Outcome: Not Progressing   Problem: Pain Managment: Goal: General experience of comfort will improve and/or be controlled Outcome: Not Progressing   Problem: Safety: Goal: Ability to remain free from injury will improve Outcome: Not Progressing   Problem: Skin Integrity: Goal: Risk  for impaired skin integrity will decrease Outcome: Not Progressing

## 2024-05-08 NOTE — Progress Notes (Signed)
 1521 STAT orders placed for PICC line placement.

## 2024-05-09 DIAGNOSIS — R52 Pain, unspecified: Secondary | ICD-10-CM

## 2024-05-09 DIAGNOSIS — Z7189 Other specified counseling: Secondary | ICD-10-CM | POA: Diagnosis not present

## 2024-05-09 DIAGNOSIS — K559 Vascular disorder of intestine, unspecified: Secondary | ICD-10-CM | POA: Diagnosis not present

## 2024-05-09 LAB — GLUCOSE, CAPILLARY
Glucose-Capillary: 104 mg/dL — ABNORMAL HIGH (ref 70–99)
Glucose-Capillary: 117 mg/dL — ABNORMAL HIGH (ref 70–99)
Glucose-Capillary: 124 mg/dL — ABNORMAL HIGH (ref 70–99)
Glucose-Capillary: 135 mg/dL — ABNORMAL HIGH (ref 70–99)
Glucose-Capillary: 79 mg/dL (ref 70–99)

## 2024-05-09 LAB — CBC
HCT: 22.5 % — ABNORMAL LOW (ref 36.0–46.0)
Hemoglobin: 7.5 g/dL — ABNORMAL LOW (ref 12.0–15.0)
MCH: 32.1 pg (ref 26.0–34.0)
MCHC: 33.3 g/dL (ref 30.0–36.0)
MCV: 96.2 fL (ref 80.0–100.0)
Platelets: 280 K/uL (ref 150–400)
RBC: 2.34 MIL/uL — ABNORMAL LOW (ref 3.87–5.11)
RDW: 14.8 % (ref 11.5–15.5)
WBC: 13.8 K/uL — ABNORMAL HIGH (ref 4.0–10.5)
nRBC: 0 % (ref 0.0–0.2)

## 2024-05-09 MED ORDER — OXYCODONE HCL 5 MG PO TABS
2.5000 mg | ORAL_TABLET | ORAL | Status: DC | PRN
  Administered 2024-05-09 – 2024-05-11 (×10): 5 mg via ORAL
  Filled 2024-05-09 (×11): qty 1

## 2024-05-09 MED ORDER — ACETAMINOPHEN 325 MG PO TABS
650.0000 mg | ORAL_TABLET | Freq: Four times a day (QID) | ORAL | Status: DC
  Administered 2024-05-09 – 2024-05-12 (×11): 650 mg via ORAL
  Filled 2024-05-09 (×13): qty 2

## 2024-05-09 MED ORDER — ENSURE PLUS HIGH PROTEIN PO LIQD
237.0000 mL | Freq: Three times a day (TID) | ORAL | Status: DC
  Administered 2024-05-09 – 2024-05-11 (×3): 237 mL via ORAL

## 2024-05-09 MED ORDER — TRACE MINERALS CU-MN-SE-ZN 300-55-60-3000 MCG/ML IV SOLN
INTRAVENOUS | Status: AC
  Filled 2024-05-09: qty 384

## 2024-05-09 NOTE — Progress Notes (Signed)
 Daily Progress Note   Patient Name: Kylie [name]       Date: 05/09/2024 DOB: 06/18/1956  Age: 68 y.o. MRN#: [id number] Attending Physician: Al-Sultani, Anmar, MD Primary Care Physician: [facility] Admit Date: 04/27/2024  Reason for Consultation/Follow-up: Establishing goals of care  Subjective: Notes and labs reviewed.  Ongoing epic chat with attending regarding plans and surrogate decision maker.  TOC helping to determine surrogate decision maker as hospital has been unable to reach patient's brother, who cousin Kylie [name] stated was patient's H POA.  Per nursing, family should be coming tomorrow afternoon.  PMT will attempt to come to bedside during that time.  In to see patient.  She states she is having abdominal pain that feels dull.  She states because of this pain she is not hungry.  Midline dressing C/D/I.  She denies other complaint or issue.  No family at bedside.  Pain medication adjustments made.    Length of Stay: 12  Current Medications: Scheduled Meds:   sodium chloride    Intravenous Once   sodium chloride    Intravenous Once   acetaminophen   650 mg Oral Q6H   amLODipine   10 mg Oral Daily   Chlorhexidine  Gluconate Cloth  6 each Topical Daily   cloNIDine   0.1 mg Transdermal Weekly   feeding supplement  237 mL Oral BID BM   insulin  aspart  0-6 Units Subcutaneous Q6H   irbesartan  150 mg Oral Daily   levothyroxine   100 mcg Oral q morning   melatonin  5 mg Oral QHS   sodium chloride  flush  10-40 mL Intracatheter Q12H    Continuous Infusions:  TPN ADULT (ION) 75 mL/hr at 05/08/24 1831   TPN ADULT (ION)      PRN Meds: OLANZapine  zydis, mouth rinse, oxyCODONE, sodium chloride  flush  Physical Exam Pulmonary:     Effort: Pulmonary effort is normal.   Abdominal:     General: Abdomen is flat.     Comments: Midline dressing intact  Skin:    General: Skin is warm and dry.  Neurological:     Mental Status: She is alert.             Vital Signs: BP (!) 149/123 (BP Location: Right Arm)   Pulse 88   Temp (!) 97.4 F (36.3 C)   Resp 17  Ht 5' 10 (1.778 m)   Wt 85.8 kg   SpO2 100%   BMI 27.14 kg/m  SpO2: SpO2: 100 % O2 Device: O2 Device: Room Air O2 Flow Rate: O2 Flow Rate (L/min): 6 L/min  Intake/output summary:  Intake/Output Summary (Last 24 hours) at 05/09/2024 1521 Last data filed at 05/09/2024 1427 Gross per 24 hour  Intake 8.39 ml  Output --  Net 8.39 ml   LBM: Last BM Date : 05/08/24 Baseline Weight: Weight: 79.5 kg Most recent weight: Weight: 85.8 kg        Patient Active Problem List   Diagnosis Date Noted   Malnutrition of moderate degree 04/29/2024   Ischemic bowel disease 04/27/2024   Closed loop obstruction of intestine (HCC) 04/27/2024   Chronic combined systolic and diastolic CHF (congestive heart failure) (HCC) 04/27/2024   Leukocytosis 04/27/2024   HLD (hyperlipidemia) 04/27/2024   CAD (coronary artery disease) 04/27/2024   Depression 04/27/2024   Chronic kidney disease, stage 3a (HCC) 04/27/2024   Hypertensive urgency 04/27/2024   QT prolongation 04/27/2024   Hypokalemia 10/07/2023   Sepsis, severe, due to pneumonia (HCC) 10/06/2023   RSV (respiratory syncytial virus pneumonia) 10/06/2023   Acute respiratory failure with hypoxia (HCC) 10/06/2023   Rhabdomyolysis, traumatic 08/03/2023   Hypoglycemia 08/03/2023   S/p R nephrectomy for RCC 08/03/2023   History of pulmonary embolism 08/03/2023   History of CVA (cerebrovascular accident) 08/03/2023   Dementia (HCC)    Chronic anticoagulation due to history of pulmonary embolism 05/25/2017   CVA (cerebral vascular accident) (HCC) 05/25/2017   Hyperlipidemia 05/25/2017   Obesity (BMI 30.0-34.9) 10/22/2016   Type 2 diabetes mellitus without  complication, without long-term current use of insulin  (HCC) 11/14/2014   Acquired hypothyroidism 02/19/2014   Allergic rhinitis 02/19/2014   Neuropathy 02/19/2014   Primary osteoarthritis of both knees 02/19/2014   Severe episode of recurrent major depressive disorder, without psychotic features (HCC) 02/19/2014   Vitamin B 12 deficiency 02/19/2014   Vitamin D  deficiency 02/19/2014   PE (pulmonary thromboembolism) (HCC) 10/03/2012   Malignant neoplasm of kidney (HCC) 03/21/2012   Gallstones without obstruction of gallbladder 05/08/2009   Sleep apnea 07/05/2000   Essential hypertension 12/06/1996    Palliative Care Assessment & Plan    Recommendations/Plan: Attending updated, and medications changed to provide scheduled Tylenol  650mg  Q6 , and oxycodone 2.5 to 5 mg q 4 hrs PRN, for moderate to severe pain.  Code Status:    Code Status Orders  (From admission, onward)           Start     Ordered   04/27/24 2311  Full code  Continuous       Question:  By:  Answer:  Consent: discussion documented in EHR   04/27/24 2311           Code Status History     Date Active Date Inactive Code Status Order ID Comments User Context   10/06/2023 2250 10/11/2023 1912 Full Code [id number]  Kylie [name] GAILS, MD ED   08/03/2023 0403 08/05/2023 2021 Full Code [id number]  Kylie [name] GAILS, MD ED      Advance Directive Documentation    Flowsheet Row Most Recent Value  Type of Advance Directive Healthcare Power of Attorney  Pre-existing out of facility DNR order (yellow form or pink MOST form) --  MOST Form in Place? --    Camelia Lewis, NP  Please contact Palliative Medicine Team phone at [phone] for questions and concerns.

## 2024-05-09 NOTE — TOC Progression Note (Signed)
 Transition of Care [facility]) - Progression Note    Patient Details  Name: Kylie [name] MRN: [id number] Date of Birth: [birth year]  Transition of Care [facility]) CM/SW Contact  Alvaro Louder, [place] Phone Number: 05/09/2024, 9:28 AM  Clinical Narrative:   Patient is not medically ready. Per [employer]. Patient cannot return until taken off TPN.   TOC will continue to monitor for discharge                      Expected Discharge Plan and Services                                               Social Drivers of Health (SDOH) Interventions SDOH Screenings   Food Insecurity: Patient Unable To Answer (04/28/2024)  Housing: Unknown (04/28/2024)  Transportation Needs: Patient Unable To Answer (04/28/2024)  Utilities: Patient Unable To Answer (04/28/2024)  Financial Resource Strain: Low Risk (09/05/2020)   Received from [facility]  Physical Activity: Inactive (03/04/2021)   Received from [facility]  Social Connections: Patient Unable To Answer (04/28/2024)  Tobacco Use: Low Risk  (05/03/2024)  Health Literacy: High Risk (01/21/2021)   Received from [facility]    Readmission Risk Interventions     No data to display

## 2024-05-09 NOTE — Progress Notes (Signed)
 [facility] SURGICAL PROGRESS NOTE  Hospital Day(s): 12.   Post op day(s): 12 Days Post-Op.   Interval History:  Patient seen and examined  Nothing acute overnight She is more alert this AM; sitting up in bed Abdominal soreness; unchanged  No fever, chills, nausea, emesis  WBC stable; 13.8K Hgb to 7.5; relatively stable over last 24 hours  She is having bowel function; confirmed with RN staff  FLD; TPN  Vital signs in last 24 hours: [min-max] current  Temp:  [98.3 F (36.8 C)-98.7 F (37.1 C)] 98.7 F (37.1 C) (11/11 0353) Pulse Rate:  [91-97] 94 (11/11 0353) Resp:  [18] 18 (11/11 0353) BP: (130-141)/(58-72) 133/72 (11/11 0353) SpO2:  [100 %] 100 % (11/11 0353) Weight:  [85.8 kg] 85.8 kg (11/11 0406)     Height: 5' 10 (177.8 cm) Weight: 85.8 kg BMI (Calculated): 27.14   Intake/Output last 2 shifts:  11/10 0701 - 11/11 0700 In: 8.4 [I.V.:8.4] Out: -    Physical Exam:  Constitutional: alert, cooperative; NAD Respiratory: breathing non-labored at rest Cardiovascular: regular rate and sinus rhythm  Gastrointestinal: Soft, incisional soreness, distension seems improved, no rebounds/guarding  Integumentary: Laparotomy is intact with staples; no drainage   Labs:     Latest Ref Rng & Units 05/09/2024    5:36 AM 05/08/2024   10:01 AM 05/08/2024    6:38 AM  CBC  WBC 4.0 - 10.5 K/uL 13.8   14.4   Hemoglobin 12.0 - 15.0 g/dL 7.5  8.0  7.9   Hematocrit 36.0 - 46.0 % 22.5   23.9   Platelets 150 - 400 K/uL 280   261       Latest Ref Rng & Units 05/08/2024    7:33 AM 05/08/2024    6:38 AM 05/07/2024    8:57 AM  CMP  Glucose 70 - 99 mg/dL 86  90  848   BUN 8 - 23 mg/dL 30  31  31    Creatinine 0.44 - 1.00 mg/dL 9.18  9.20  9.19   Sodium 135 - 145 mmol/L 143  145  141   Potassium 3.5 - 5.1 mmol/L 3.7  3.6  4.2   Chloride 98 - 111 mmol/L 114  116  114   CO2 22 - 32 mmol/L 20  20  21    Calcium  8.9 - 10.3 mg/dL 8.1  8.2  7.9   Total Protein 6.5 - 8.1 g/dL   5.4    Total Bilirubin 0.0 - 1.2 mg/dL  0.7    Alkaline Phos 38 - 126 U/L  80    AST 15 - 41 U/L  39    ALT 0 - 44 U/L  62       Imaging studies:  No new imaging studies .   Assessment/Plan: 68 y.o. female 12 Days Post-Op s/p exploratory laparotomy with small bowel resection, ileocecectomy, ileocolonic anastomosis and reduction of internal hernia   - Will advance to soft diet   - Wean TPN today to 1/2 rate   - Monitor H&H; responded to transfusion - Monitor abdominal examination; on-going bowel function    - Pain control prn; antiemetics prn - Mobilize; okay to work with therapies; on-board - Further management per primary service; we will follow    All of the above findings and recommendations were discussed with the patient, and the medical team, and all of patient's questions were answered to her expressed satisfaction.  -- Arthea Platt, PA-C [facility] 05/09/2024, 7:07 AM M-F: harmon -  4pm

## 2024-05-09 NOTE — Progress Notes (Signed)
 PHARMACY - TOTAL PARENTERAL NUTRITION CONSULT NOTE   Indication: Small bowel obstruction  Patient Measurements: Height: 5' 10 (177.8 cm) Weight: 85.8 kg (189 lb 2.5 oz) IBW/kg (Calculated) : 68.5 TPN AdjBW (KG): 79.6 Body mass index is 27.14 kg/m. Usual Weight: 75.8 kg (02/07/24)  Assessment:  Kylie [name] is a 68yoF that presented with upper abdominal pain. Patient's past medical history notable for dementia, HTN, HLD, sCHF with EF 40-45% (10/08/23), CVA, CKD-3a, depression, DM, PE on apixaban, and s/p right nephrectomy in 2009 for right renal cell carcinoma.  Glucose / Insulin : BG 95-135. (No SSI in last 24 hours)  Electrolytes: K 3.7, Mag 2.0, Phos 3.1, Corrected Ca 9.6 Renal:  Scr 0.87 > 0.80 > 0.79 Hepatic: Alk phos 80, AST 39, ALT 62, total bili 0.7, albumin 2.2 Intake / Output; MIVF: Net since admission: (+) 12.2L GI Imaging: 10/30 CT Angio Impression: Closed loop small bowel obstruction likely secondary to an internal hernia with findings concerning for developing bowel ischemia. Clinical correlation and surgical consult is advised. Status post right nephrectomy. 11/5 CTABD: Dilated small-bowel loops up to 4.3 cm without focal obstruction, consistent with ileus. Small amount of free fluid. GI Surgeries / Procedures:  10/31: Exploratory Laparotomy, reduction of internal hernia, small bowel resection with cecectomy (length 110 cm), ileocolic anastomosis  ID Zosyn 10/31 >>  Central access: yes TPN start date: 04/28/2024  Nutritional Goals: Goal TPN rate is 75 mL/hr (provides 108 g of protein and 2005 kcals per day)  RD Assessment: Estimated Needs Total Energy Estimated Needs: 2000-2300kcal/day Total Protein Estimated Needs: 100-115g/day Total Fluid Estimated Needs: 1.8-2.1L/day  Current Nutrition:  Full liquid 11/7 >> soft foods 11/11  Plan:  Per surgery, advancing diet to soft foods today Wean TPN to 40 ml/hr (~half goal rate) at 1800 Electrolytes in TPN  (standard): Na 62mEq/L, K 50mEq/L, Ca 9mEq/L, Mg 36mEq/L, and Phos 15mmol/L Cl:Ac 1:1 (watch) Add standard MVI and trace elements to TPN Thiamine 100mg  IV daily x 7 days per dietician (completed) Continue Sensitive  q6h SSI and adjust as needed  Monitor TPN labs on Mon/Thurs  Thank you for involving pharmacy in this patient's care.   Damien Napoleon, PharmD Clinical Pharmacist 05/09/2024 8:50 AM

## 2024-05-09 NOTE — Plan of Care (Signed)

## 2024-05-09 NOTE — Progress Notes (Signed)
 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  CTA C/A/P showed closed-loop SBO with developing ischemia.  She received Kcentra for DOAC reversal and underwent emergent ex lap on 04/27/2024 with reduction of internal hernia, resection of approximately 110 cm distal small bowel with ileocecectomy, and ileocolic anastomosis.   Assessment and Plan:  # Ischemic bowel status post ex lap with SB resection plus ileocecectomy and ileocolic anastomosis (10/30) - Course complicated by post-op ileus - CT 11/5 c/w ileus, no obstruction/perforation/abscess - IV Zosyn was stopped on 11/7 after 8 days - JP drain removed on 11/8 - Leukocytosis improving, afebrile and HDS. Will monitor and reconsider restarting antibiotics if clinically worsening - Nutrition: Very poor p.o. intake. Didn't receive much TPN on 11/10 as PICC line was pulled out overnight and not replaced until the afternoon. Surgery advanced diet to soft diet today and TPN down to half rate.   # Acute blood loss anemia # Hematochezia  - Status post transfusion of 1 unit pRBC on 11/3 for Hgb 6.4 - Received 2 doses of Eliquis 11/6 PM and 11/7 AM, held after drop in Hgb  - Had hematochezia x 7 episodes on 11/9-11/10 AM with Hgb drop to nadir of 5.4. Transfused 2 units pRBC. 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 Not oriented to situation.   # GOC - Has no insight into the severity of her medical condition. Has no capacity to make her own medical decisions - No clear medical decision maker within the family.  - Alexandra Lipps (brother) is designated HCPOA, but has not been reachable on the phone.  - Lenix Benoist (cousin) is durable POA, but unable to make decisions individually, last spoke with him on 11/9, could not reach him today, left voicemail. - Kameo Bains (son) was informed about plan of care and need to readdress GOC on 11/10. Informed me the family would be coming in today for GOC discussion. Now informed that they would not be coming in today. - Palliative and TOC following   DVT prophylaxis: Place and maintain sequential compression device Start: 04/28/24 1722 SCDs Start: 04/27/24 2311   Code Status:   Code Status: Full Code  Family Communication: Was informed yesterday by Mr. Carlynn, son, that the family would be coming together to make a decision on GOC today. However, this morning he informed me that they would not be coming in due to work. Attempted to reach Mr. Koren Finder, [facility]), no answer and can't  leave voicemail. Attempted to reach Mr. [name] Penman (durable POA), no answer, voicemail left.   Disposition Plan: Pending clinical improvement PT Follow up Recs: Skilled Nursing-Short Term Rehab (<3 Hours/Day)05/05/2024 1655  Level of care: Telemetry  Consultants:  General surgery, palliative care  Procedures:  exploratory laparotomy with reduction of internal hernia, small  bowel resection (~110 cm) with ileocecectomy and ileocolic anastomosis  Antimicrobials: IV Zosyn stopped on 11/7    Subjective: Evaluated patient at bedside. No acute events overnight. No bloody bowel movements, had 2 BM that were nonbloody. Continues to have significant abdominal pain and no PO intake. Refusing to eat due to pain. Moved breakfast tray in front of her and opened food containers for her. Asked CNA to assist with sitting her up and continued encouragement to eat. Denies nausea, vomiting, fevers, and chills.   Objective: Vitals:   05/08/24 1627 05/08/24 2053 05/09/24 0353 05/09/24 0406  BP: (!) 139/58 (!) 141/68 133/72   Pulse: 97 96 94   Resp: 18 18 18    Temp: 98.3 F (36.8 C) 98.4 F (36.9 C) 98.7 F (37.1 C)   TempSrc:      SpO2: 100% 100% 100%   Weight:    85.8 kg  Height:        Intake/Output Summary (Last 24 hours) at 05/09/2024 0907 Last data filed at 05/08/2024 1840 Gross per 24 hour  Intake 8.39 ml  Output --  Net 8.39 ml   Filed Weights   05/06/24 0437 05/08/24 0500 05/09/24 0406  Weight: 84.4 kg 84 kg 85.8 kg    Examination:  General exam: Uncomfortable, ill-appearing Respiratory system: No increased WOB. CTAB. Cardiovascular system: +S1/S2, RRR. No JVD or murmurs. No pedal edema. Gastrointestinal system: Soft, generalized tenderness to palpation. Midline abdominal incision c/d/i without signs of bleeding. Central nervous system: Alert and oriented only to person. No focal neurological deficits. Extremities: 5/5 strength symmetrically. PICC line in LUE. Skin: No rashes, lesions or ulcers Psychiatry: Judgement and insight appear severely  limited. Mood & affect appropriate.   Data Reviewed: I have personally reviewed following labs and imaging studies  CBC: Recent Labs  Lab 05/06/24 0556 05/07/24 0535 05/07/24 0857 05/07/24 1518 05/07/24 2249 05/08/24 0638 05/08/24 1001 05/09/24 0536  WBC 10.4 12.7* 13.8*  --   --  14.4*  --  13.8*   HGB 7.4* 5.6* 5.4* 6.6* 8.8* 7.9* 8.0* 7.5*  HCT 23.1* 17.3* 17.7*  --  26.7* 23.9*  --  22.5*  MCV 98.3 106.8* 102.9*  --   --  96.0  --  96.2  PLT 283 261 273  --   --  261  --  280   Basic Metabolic Panel: Recent Labs  Lab 05/03/24 0448 05/04/24 0522 05/07/24 0535 05/07/24 0857 05/07/24 1020 05/08/24 0638 05/08/24 0733  NA 137 137 137 141  --  145 143  K 4.0 3.5 6.8* 4.2  --  3.6 3.7  CL 108 108 110 114*  --  116* 114*  CO2 23 23 18* 21*  --  20* 20*  GLUCOSE 150* 143* 908* 151*  --  90 86  BUN 21 24* 31* 31*  --  31* 30*  CREATININE 0.77 0.85 0.87 0.80  --  0.79 0.81  CALCIUM  8.3* 8.2* 8.1* 7.9*  --  8.2* 8.1*  MG 1.8 1.8 2.3  --   --  2.0  --   PHOS 2.8 2.8 6.1*  --  3.5 3.1  --    GFR: Estimated Creatinine  Clearance: 76.9 mL/min (by C-G formula based on SCr of 0.81 mg/dL). Liver Function Tests: Recent Labs  Lab 05/04/24 0522 05/08/24 0638  AST 75* 39  ALT 96* 62*  ALKPHOS 74 80  BILITOT 0.4 0.7  PROT 6.3* 5.4*  ALBUMIN 2.3* 2.2*   No results for input(s): LIPASE, AMYLASE in the last 168 hours. No results for input(s): AMMONIA in the last 168 hours. Coagulation Profile: Recent Labs  Lab 05/07/24 0855  INR 1.1   Cardiac Enzymes: No results for input(s): CKTOTAL, CKMB, CKMBINDEX, TROPONINI in the last 168 hours. BNP (last 3 results) No results for input(s): PROBNP in the last 8760 hours. HbA1C: No results for input(s): HGBA1C in the last 72 hours. CBG: Recent Labs  Lab 05/08/24 0606 05/08/24 1209 05/08/24 1718 05/09/24 0029 05/09/24 0354  GLUCAP 74 95 91 124* 135*   Lipid Profile: Recent Labs    05/08/24 0638  TRIG 138   Thyroid Function Tests: No results for input(s): TSH, T4TOTAL, FREET4, T3FREE, THYROIDAB in the last 72 hours. Anemia Panel: No results for input(s): VITAMINB12, FOLATE, FERRITIN, TIBC, IRON, RETICCTPCT in the last 72 hours. Sepsis Labs: No results for input(s): PROCALCITON,  LATICACIDVEN in the last 168 hours.  No results found for this or any previous visit (from the past 240 hours).    Radiology Studies: US  EKG SITE RITE Result Date: 05/08/2024 If Site Rite image not attached, placement could not be confirmed due to current cardiac rhythm.   Scheduled Meds:  sodium chloride    Intravenous Once   sodium chloride    Intravenous Once   amLODipine   10 mg Oral Daily   Chlorhexidine  Gluconate Cloth  6 each Topical Daily   cloNIDine   0.1 mg Transdermal Weekly   feeding supplement  237 mL Oral BID BM   insulin  aspart  0-6 Units Subcutaneous Q6H   irbesartan  150 mg Oral Daily   levothyroxine   100 mcg Oral q morning   melatonin  5 mg Oral QHS   sodium chloride  flush  10-40 mL Intracatheter Q12H   Continuous Infusions:  TPN ADULT (ION) 75 mL/hr at 05/08/24 1831   TPN ADULT (ION)       LOS:  LOS: 12 days   Time Spent: 45 minutes  Unresulted Labs (From admission, onward)     Start     Ordered   05/08/24 0500  CBC  Daily,   R     Question:  Specimen collection method  Answer:  Lab=Lab collect   05/07/24 1952   05/01/24 0500  Comprehensive metabolic panel  (Standard TPN Labs (all labs to be drawn at 0500))  Every Mon,Thu (0500),   TIMED     Question:  Specimen collection method  Answer:  Lab=Lab collect   04/28/24 1458   05/01/24 0500  Magnesium   (Standard TPN Labs (all labs to be drawn at 0500))  Every Mon,Thu (0500),   TIMED     Question:  Specimen collection method  Answer:  Lab=Lab collect   04/28/24 1458   05/01/24 0500  Phosphorus  (Standard TPN Labs (all labs to be drawn at 0500))  Every Mon,Thu (0500),   TIMED     Question:  Specimen collection method  Answer:  Lab=Lab collect   04/28/24 1458   05/01/24 0500  Triglycerides  (Standard TPN Labs (all labs to be drawn at 0500))  Every Monday (0500),   TIMED     Question:  Specimen collection method  Answer:  Lab=Lab collect   04/28/24  1458             Duffy Larch, MD [facility]  If 7PM-7AM, please contact night-coverage  05/09/2024, 9:07 AM

## 2024-05-09 NOTE — Progress Notes (Signed)
 Nutrition Follow-up  DOCUMENTATION CODES:   Non-severe (moderate) malnutrition in context of chronic illness  INTERVENTION:   -48 hour calorie count -Continue TPN management per pharmacy (reduce to half rate today) -Monitor Mg, K, and Phos and replete as needed secondary to high refeeding risk -Continue daily weights -Continue soft diet -Continue Ensure Plus High Protein po to TID, each supplement provides 350 kcal and 20 grams of protein  -Continue Magic cup TID with meals, each supplement provides 290 kcal and 9 grams of protein  -Continue feeding assistance with meals   NUTRITION DIAGNOSIS:   Moderate Malnutrition related to chronic illness as evidenced by moderate fat depletion, moderate muscle depletion.  Ongoing  GOAL:   Patient will meet greater than or equal to 90% of their needs  Progressing   MONITOR:   Diet advancement, Labs, Weight trends, Skin, I & O's, Other (Comment) (TPN)  REASON FOR ASSESSMENT:   Consult New TPN/TNA  ASSESSMENT:   68 y.o. female with h/o dementia, HTN, HLD, CHF, CVA, CKD-3a, depression, DM, PE on Xarelto , RCC s/p right nephrectomy (2009), hypothryoidism, CAD and OSA who is admitted with SBO secondary to Internal hernia resulting in small bowel ischemia and necrosis now s/p exploratory laparotomy, reduction of internal hernia, small bowel resection with cecectomy (~110cm ileum along with IC valve) and ileocolic anastomosis 10/31.  10/31- s/p s/p exploratory laparotomy with small bowel resection, ileocecectomy, ileocolonic anastomosis and reduction of internal hernia, PICC placed, TPN initiated 11/1- NGT removed by patient, pulled out PICC line 11/2- PICC line replaced 11/3- IV thiamine ordered 11/6- advanced to clear liquid diet 11/7- advanced to full liquid diet, penrose drain removed 11/11- advanced to soft diet, TPN at half rate   Reviewed I/O's: +8.4 ml x 24 hours and +12.3 L since admission  Patient sleeping soundly at time of  visit, but arouse easily to voice. Patient reports feeling better today and sleeping well last night. Patient shares that she feels up to eating solid foods and has been drinking Ensure. Discussed importance of good meal and supplement intake to promote healing.   Case discussed with MD, who reports concern over lack of oral intake. He noted that patient pulled PICC line out over the weekend and ate very little yesterday despite encouragement. Per meal documentation records, she consumed 10% of lunch (63 kcals, 3 grams protein). Discussed concerns regarding poor oral intake; will start calorie count to better determine adequacy of oral intake.  Case discussed with [facility] RD and provided clinical update. Per RD, patient ate well PTA.   TPN is infusing at 75 ml/hr, which provides 2005 kcals and 108 grams protein, meeting 100% of estimated kcal and protein needs. Plan to wean TPN to half rate today- 40 ml/hr, which provides 1069 kcals and 58 grams protein, meeting 53% of estimated kcal needs and 58% of estimated protein needs.   No weight loss noted over the past week.   Per palliative care notes, patient is full care, full scope per family and PACE. Plan to [facility] to [facility] once TPN is stopped.   Medications reviewed and include melatonin.   Labs reviewed: CBGS: 74-135 (inpatient orders for glycemic control are 0-6 units insulin  aspart every 6 hours).    Diet Order:   Diet Order             DIET SOFT Room service appropriate? Yes; Fluid consistency: Thin  Diet effective now  EDUCATION NEEDS:   Not appropriate for education at this time  Skin:  Skin Assessment: Skin Integrity Issues: Skin Integrity Issues:: Incisions Incisions: closed abdomen  Last BM:  05/04/24 (type 6)  Height:   Ht Readings from Last 1 Encounters:  04/28/24 5' 10 (1.778 m)    Weight:   Wt Readings from Last 1 Encounters:  05/09/24 85.8 kg    Ideal Body Weight:   68.1 kg  BMI:  Body mass index is 27.14 kg/m.  Estimated Nutritional Needs:   Kcal:  2000-2300kcal/day  Protein:  100-115g/day  Fluid:  1.8-2.1L/day    Margery ORN, RD, LDN, CDCES Registered Dietitian III Certified Diabetes Care and Education Specialist If unable to reach this RD, please use RD Inpatient group chat on secure chat between hours of 8am-4 pm daily

## 2024-05-10 DIAGNOSIS — K559 Vascular disorder of intestine, unspecified: Secondary | ICD-10-CM | POA: Diagnosis not present

## 2024-05-10 DIAGNOSIS — Z7189 Other specified counseling: Secondary | ICD-10-CM | POA: Diagnosis not present

## 2024-05-10 LAB — CBC
HCT: 23 % — ABNORMAL LOW (ref 36.0–46.0)
Hemoglobin: 7.7 g/dL — ABNORMAL LOW (ref 12.0–15.0)
MCH: 32.2 pg (ref 26.0–34.0)
MCHC: 33.5 g/dL (ref 30.0–36.0)
MCV: 96.2 fL (ref 80.0–100.0)
Platelets: 278 K/uL (ref 150–400)
RBC: 2.39 MIL/uL — ABNORMAL LOW (ref 3.87–5.11)
RDW: 15 % (ref 11.5–15.5)
WBC: 13.8 K/uL — ABNORMAL HIGH (ref 4.0–10.5)
nRBC: 0 % (ref 0.0–0.2)

## 2024-05-10 LAB — GLUCOSE, CAPILLARY
Glucose-Capillary: 109 mg/dL — ABNORMAL HIGH (ref 70–99)
Glucose-Capillary: 118 mg/dL — ABNORMAL HIGH (ref 70–99)
Glucose-Capillary: 120 mg/dL — ABNORMAL HIGH (ref 70–99)
Glucose-Capillary: 129 mg/dL — ABNORMAL HIGH (ref 70–99)
Glucose-Capillary: 92 mg/dL (ref 70–99)

## 2024-05-10 MED ORDER — TRACE MINERALS CU-MN-SE-ZN 300-55-60-3000 MCG/ML IV SOLN
INTRAVENOUS | Status: AC
  Filled 2024-05-10: qty 384

## 2024-05-10 NOTE — Progress Notes (Signed)
 Occupational Therapy Treatment Patient Details Name: Kylie [name] MRN: [id number] DOB: 10/01/1955 Today's Date: 05/10/2024   History of present illness Pt is a 68 y/o F admitted on 04/27/24 after presenting with c/o abdominal pain. Imaging showed ischemic bowel disease. Pt is s/p ex lap & small bowel resection with cecectomy on 04/27/24. PMH: dementia, HTN, HLD, sCHF, CVA, CKD 3A, depression, DM, PE on xarelto , s/p R nephrectomy (2009) 2/2 renal cell carcinoma   OT comments  Patient seen for OT treatment on this date. Upon arrival to room patient resting in bed with family present, agreeable to treatment. Patient transitioned to EOB with HOB raised without physical A; reports discomfort in abdomen, edu on log rolling, she states understanding. Patient reports urgently needing to use bathroom, BSC set up and patient performed SPT from EOB to [facility] with poor body mechanics and limited teach back on instructions for safety, patient reporting I have to hurry or I'll pee on the floor. Patient had been incontinent of urine prior to OT so bed linen changed, patient cleaned and new gown applied. Patient required increased time to complete voiding. RN present at this time. Patient reports back was very itchy, noted scratch marks all over back with one small open area, RN observed as well. Lotion applied and education provided on maintaining skin integrity, she will need follow up. Patient transferred back to bed with improved safety awareness and implemetnation of safety instructions.  Patient ended treatment in bed with RN and family present. Patient making good progress toward goals, will continue to follow POC. Discharge recommendation remains appropriate.        If plan is discharge home, recommend the following:  A lot of help with bathing/dressing/bathroom;Supervision due to cognitive status;A little help with bathing/dressing/bathroom;A little help with walking and/or transfers   Equipment  Recommendations  Other (comment) (defer to next venue of care)    Recommendations for Other Services      Precautions / Restrictions Precautions Precautions: Fall Recall of Precautions/Restrictions: Impaired Restrictions Weight Bearing Restrictions Per Provider Order: No       Mobility Bed Mobility Overal bed mobility: Needs Assistance Bed Mobility: Supine to Sit, Sit to Supine     Supine to sit: Supervision Sit to supine: Supervision        Transfers Overall transfer level: Needs assistance Equipment used: Rolling Ludvigsen (2 wheels) Transfers: Bed to chair/wheelchair/BSC Sit to Stand: Total assist Stand pivot transfers: Min assist         General transfer comment: cues for safety and body mechanics     Balance Overall balance assessment: Needs assistance Sitting-balance support: No upper extremity supported, Feet supported Sitting balance-Leahy Scale: Good Sitting balance - Comments: CGA static sitting EOB   Standing balance support: Bilateral upper extremity supported Standing balance-Leahy Scale: Fair Standing balance comment: Min A/CGA for safety, RW use                           ADL either performed or assessed with clinical judgement   ADL Overall ADL's : Needs assistance/impaired                         Toilet Transfer: Minimal assistance;BSC/3in1 Statistician Details (indicate cue type and reason): cues for hand placement and body mechanics using rw to [facility] Toileting- Clothing Manipulation and Hygiene: Contact guard assist;Sit to/from stand Toileting - Clothing Manipulation Details (indicate cue type and reason): standing with r/w receiving  A after incontinent urine            Extremity/Trunk Assessment Upper Extremity Assessment Upper Extremity Assessment: Generalized weakness            Vision       Perception     Praxis     Communication Communication Communication: Impaired Factors Affecting  Communication: Reduced clarity of speech   Cognition Arousal: Alert Behavior During Therapy: WFL for tasks assessed/performed Cognition: History of cognitive impairments, No family/caregiver present to determine baseline, Cognition impaired   Orientation impairments: Situation Awareness: Online awareness impaired Memory impairment (select all impairments): Short-term memory, Declarative long-term memory                       Following commands: Impaired Following commands impaired: Follows one step commands with increased time, Follows one step commands inconsistently      Cueing   Cueing Techniques: Verbal cues, Tactile cues, Visual cues  Exercises      Shoulder Instructions       General Comments      Pertinent Vitals/ Pain       Pain Assessment Pain Assessment: 0-10 Pain Score: 5  Pain Location: abdominal incision Pain Descriptors / Indicators: Grimacing, Guarding, Moaning Pain Intervention(s): Monitored during session  Home Living                                          Prior Functioning/Environment              Frequency  Min 2X/week        Progress Toward Goals  OT Goals(current goals can now be found in the care plan section)  Progress towards OT goals: Progressing toward goals  Acute Rehab OT Goals Patient Stated Goal: to go home OT Goal Formulation: With patient Time For Goal Achievement: 05/13/24 Potential to Achieve Goals: Good ADL Goals Pt Will Perform Grooming: with supervision;sitting Pt Will Perform Lower Body Dressing: with supervision;sitting/lateral leans;sit to/from stand Pt Will Transfer to Toilet: with supervision;ambulating Pt Will Perform Toileting - Clothing Manipulation and hygiene: with supervision;sitting/lateral leans;sit to/from stand  Plan      Co-evaluation                 AM-PAC OT 6 Clicks Daily Activity     Outcome Measure   Help from another person eating meals?: A  Little Help from another person taking care of personal grooming?: A Little Help from another person toileting, which includes using toliet, bedpan, or urinal?: A Little Help from another person bathing (including washing, rinsing, drying)?: A Lot Help from another person to put on and taking off regular upper body clothing?: A Little Help from another person to put on and taking off regular lower body clothing?: A Lot 6 Click Score: 16    End of Session Equipment Utilized During Treatment: Rolling Pankowski (2 wheels);Gait belt  OT Visit Diagnosis: Muscle weakness (generalized) (M62.81);Other abnormalities of gait and mobility (R26.89);Pain   Activity Tolerance Patient tolerated treatment well   Patient Left in bed;with family/visitor present;with nursing/sitter in room   Nurse Communication          Time: 1251-1316 OT Time Calculation (min): 25 min  Charges: OT General Charges $OT Visit: 1 Visit OT Treatments $Self Care/Home Management : 23-37 mins  Rogers Clause, OT/L MSOT, 05/10/2024

## 2024-05-10 NOTE — Progress Notes (Signed)
 [facility] SURGICAL PROGRESS NOTE  Hospital Day(s): 13.   Post op day(s): 13 Days Post-Op.   Interval History:  Patient seen and examined  Nothing acute overnight She continues to report abdominal pain; this is incisional  No fever, chills, nausea, emesis WBC elevated but stable; 13.8K Hgb to 7.7; improved slightly over last 24 hours She continues to have bowel function On soft diet; I do not think she is eating much TPN; 1/2 rate   Vital signs in last 24 hours: [min-max] current  Temp:  [97.4 F (36.3 C)-98.5 F (36.9 C)] 97.9 F (36.6 C) (11/12 0407) Pulse Rate:  [88-113] 96 (11/12 0407) Resp:  [17-19] 18 (11/12 0407) BP: (109-149)/(53-123) 109/86 (11/12 0407) SpO2:  [100 %] 100 % (11/12 0407)     Height: 5' 10 (177.8 cm) Weight: 85.8 kg BMI (Calculated): 27.14   Intake/Output last 2 shifts:  11/11 0701 - 11/12 0700 In: 471 [I.V.:471] Out: -    Physical Exam:  Constitutional: alert, cooperative; NAD Respiratory: breathing non-labored at rest Cardiovascular: regular rate and sinus rhythm  Gastrointestinal: Soft, incisional soreness, distension seems improved, no rebounds/guarding  Integumentary: Laparotomy is intact with staples; no drainage   Labs:     Latest Ref Rng & Units 05/10/2024    4:53 AM 05/09/2024    5:36 AM 05/08/2024   10:01 AM  CBC  WBC 4.0 - 10.5 K/uL 13.8  13.8    Hemoglobin 12.0 - 15.0 g/dL 7.7  7.5  8.0   Hematocrit 36.0 - 46.0 % 23.0  22.5    Platelets 150 - 400 K/uL 278  280        Latest Ref Rng & Units 05/08/2024    7:33 AM 05/08/2024    6:38 AM 05/07/2024    8:57 AM  CMP  Glucose 70 - 99 mg/dL 86  90  848   BUN 8 - 23 mg/dL 30  31  31    Creatinine 0.44 - 1.00 mg/dL 9.18  9.20  9.19   Sodium 135 - 145 mmol/L 143  145  141   Potassium 3.5 - 5.1 mmol/L 3.7  3.6  4.2   Chloride 98 - 111 mmol/L 114  116  114   CO2 22 - 32 mmol/L 20  20  21    Calcium  8.9 - 10.3 mg/dL 8.1  8.2  7.9   Total Protein 6.5 - 8.1 g/dL  5.4     Total Bilirubin 0.0 - 1.2 mg/dL  0.7    Alkaline Phos 38 - 126 U/L  80    AST 15 - 41 U/L  39    ALT 0 - 44 U/L  62       Imaging studies:  No new imaging studies .   Assessment/Plan: 68 y.o. female 13 Days Post-Op s/p exploratory laparotomy with small bowel resection, ileocecectomy, ileocolonic anastomosis and reduction of internal hernia   - Continue soft diet; nutritional supplementation   - Continue TPN to 1/2 rate; If we can better quantify her caloric intake, we can stop this.   - We can potentially remove staples prior to discharge   - Monitor H&H; responded to transfusion; stable  - Monitor abdominal examination; on-going bowel function    - Pain control prn; antiemetics prn - Mobilize; okay to work with therapies; on-board - Further management per primary service; we will follow    All of the above findings and recommendations were discussed with the patient, and the medical team, and all of  patient's questions were answered to her expressed satisfaction.  -- Arthea Platt, PA-C [facility] 05/10/2024, 7:25 AM M-F: 7am - 4pm

## 2024-05-10 NOTE — Progress Notes (Addendum)
 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 Complaints of diffuse severe abdominal pain of unknown duration and hypertensive crisis. CTA C/A/P showed closed-loop SBO with developing ischemia. She received Kcentra for DOAC reversal and underwent emergent ex lap on 04/27/2024 with reduction of internal hernia, resection of approximately 110 cm distal small bowel with ileocecectomy, and ileocolic anastomosis. Status post transfusion of 1 unit pRBC on 11/3 for Hgb 6.4. Course complicated by postop ileus as of 11/05. IV Zosyn was stopped on 11/07 after 8 days JP drain removed on 11/08. Had hematochezia x 7 episodes on 11/9-11/10 AM with Hgb drop to nadir of 5.4. Transfused 2 units pRBC. Surgery aware, suspect bleeding at anastomosis site, inaccessible endoscopically. Has been on TPN and recently started on po diet but her intake has been poor.      Consultants:  General Surgery   Procedures/Surgeries: 04/27/24: exploratory laparotomy with small bowel resection, ileocecectomy, ileocolonic anastomosis and reduction of internal hernia       ASSESSMENT & PLAN:   # Ischemic bowel status post ex lap with SB resection plus ileocecectomy and ileocolic anastomosis (10/30) # Course complicated by post-op ileus (11/05) General surgery following  remains on TPN Poor po intake  Question for feeding tube but given dementia she is a poor candidate for this    # Acute blood loss anemia # Hematochezia  Monitor with serial H/H Continue holding Eliquis   # History of PE s/p  IVC filter Eliquis held due to hematochezia and acute blood loss anemia   # Hypertensive urgency - resolved # Primary hypertension BP stable Continue amlodipine  10 mg daily and clonidine  patch   # AKI on CKD 3a - resolved Monitor BMP   #Chronic 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 Not oriented to situation.    # GOC Has no insight into the severity of her medical condition.  Has no capacity to make her own medical decisions Per her cousin Champ, at bedside today), he is her financial POA and pt's brother Koren is her HCPOA but we do not have forms confirming this, [name] states these forms are on file w/ [facility] so I have placed order for records request.  Jamesina Gaugh (brother) is designated HCPOA, but has not been reachable on the phone - [name] gave me updated phone today for Koren which is his mobile number and should work better. Palliative and TOC following  goal to wean off TPN and advance diet - if not able to accomplish this such that pt is meeting nutritional requirements, then would need to talk about alternative plan and likely hospice Not a good candidate for feeding tube     overweight based on BMI: Body mass index is 27.14 kg/m.SABRA Significantly low or high BMI is associated with higher medical risk.  Underweight - under 18  overweight - 25 to 29 obese - 30 or more Class 1 obesity: BMI of 30.0 to 34  Class 2 obesity: BMI of 35.0 to 39 Class 3 obesity: BMI of 40.0 to 49 Super Morbid Obesity: BMI 50-59 Super-super Morbid Obesity: BMI 60+ Healthy nutrition and physical activity advised as adjunct to other disease management and risk reduction treatments    DVT prophylaxis: holding d/t bleed risk IV fluids: no continuous IV fluids  Nutrition: TPN and advancing po as tolerate but intake has been poor as above  Central  lines / other devices: PICC placed 05/08/24  Code Status: FULL CODE ACP documentation reviewed:  none on file in [facility]  TOC needs: TBD, potential hospice / back to SNF w/ PT/OT if able  Medical barriers to dispo: TPN. Expected medical readiness for discharge pending po intake / GOC.              Subjective / Brief ROS:  Patient reports feels fine - she has no complaints Majority of information obtained from her cousin who is at bedside on rounds    Family Communication: [name], pt's cousin at bedside     Objective Findings:  Vitals:   05/09/24 1925 05/09/24 2023 05/10/24 0407 05/10/24 0811  BP: (!) 132/53 122/73 109/86 130/74  Pulse: (!) 113 (!) 109 96 79  Resp: 18 19 18 17   Temp: (!) 97.5 F (36.4 C) 98 F (36.7 C) 97.9 F (36.6 C) (!) 97.4 F (36.3 C)  TempSrc: Oral Oral Oral   SpO2: 100% 100% 100% 100%  Weight:      Height:        Intake/Output Summary (Last 24 hours) at 05/10/2024 1420 Last data filed at 05/10/2024 9391 Gross per 24 hour  Intake 470.98 ml  Output --  Net 470.98 ml   Filed Weights   05/06/24 0437 05/08/24 0500 05/09/24 0406  Weight: 84.4 kg 84 kg 85.8 kg    Examination:  Physical Exam Constitutional:      General: She is not in acute distress. Cardiovascular:     Rate and Rhythm: Normal rate and regular rhythm.     Heart sounds: Murmur heard.  Pulmonary:     Effort: Pulmonary effort is normal.     Breath sounds: Normal breath sounds.  Abdominal:     General: Bowel sounds are normal.     Palpations: Abdomen is soft.     Tenderness: There is generalized abdominal tenderness.  Neurological:     Mental Status: She is alert. She is disoriented.  Psychiatric:        Mood and Affect: Mood normal.        Behavior: Behavior normal.          Scheduled Medications:   sodium chloride    Intravenous Once   sodium chloride    Intravenous Once   acetaminophen   650 mg Oral Q6H   amLODipine   10 mg Oral Daily   Chlorhexidine   Gluconate Cloth  6 each Topical Daily   cloNIDine   0.1 mg Transdermal Weekly   feeding supplement  237 mL Oral TID BM   insulin  aspart  0-6 Units Subcutaneous Q6H   irbesartan  150 mg Oral Daily   levothyroxine   100 mcg Oral q morning   melatonin  5 mg Oral QHS   sodium chloride  flush  10-40 mL Intracatheter Q12H    Continuous Infusions:  TPN ADULT (ION) 40 mL/hr at 05/09/24 1816   TPN ADULT (ION)      PRN Medications:  OLANZapine  zydis, mouth rinse, oxyCODONE, sodium chloride  flush  Antimicrobials from admission:  Anti-infectives (From admission, onward)  Start     Dose/Rate Route Frequency Ordered Stop   04/28/24 0600  piperacillin-tazobactam (ZOSYN) IVPB 3.375 g  Status:  Discontinued        3.375 g 12.5 mL/hr over 240 Minutes Intravenous Every 8 hours 04/28/24 0256 05/05/24 1340   04/27/24 2300  cefoTEtan (CEFOTAN) 2 g in sodium chloride  0.9 % 100 mL IVPB        2 g 200 mL/hr over 30 Minutes Intravenous On call to O.R. 04/27/24 2255 04/28/24 1319           Data Reviewed:  I have personally reviewed the following...  CBC: Recent Labs  Lab 05/07/24 0535 05/07/24 0857 05/07/24 1518 05/07/24 2249 05/08/24 0638 05/08/24 1001 05/09/24 0536 05/10/24 0453  WBC 12.7* 13.8*  --   --  14.4*  --  13.8* 13.8*  HGB 5.6* 5.4*   < > 8.8* 7.9* 8.0* 7.5* 7.7*  HCT 17.3* 17.7*  --  26.7* 23.9*  --  22.5* 23.0*  MCV 106.8* 102.9*  --   --  96.0  --  96.2 96.2  PLT 261 273  --   --  261  --  280 278   < > = values in this interval not displayed.   Basic Metabolic Panel: Recent Labs  Lab 05/04/24 0522 05/07/24 0535 05/07/24 0857 05/07/24 1020 05/08/24 0638 05/08/24 0733  NA 137 137 141  --  145 143  K 3.5 6.8* 4.2  --  3.6 3.7  CL 108 110 114*  --  116* 114*  CO2 23 18* 21*  --  20* 20*  GLUCOSE 143* 908* 151*  --  90 86  BUN 24* 31* 31*  --  31* 30*  CREATININE 0.85 0.87 0.80  --  0.79 0.81  CALCIUM  8.2* 8.1* 7.9*  --  8.2* 8.1*  MG 1.8 2.3  --   --  2.0  --    PHOS 2.8 6.1*  --  3.5 3.1  --    GFR: Estimated Creatinine Clearance: 76.9 mL/min (by C-G formula based on SCr of 0.81 mg/dL). Liver Function Tests: Recent Labs  Lab 05/04/24 0522 05/08/24 0638  AST 75* 39  ALT 96* 62*  ALKPHOS 74 80  BILITOT 0.4 0.7  PROT 6.3* 5.4*  ALBUMIN 2.3* 2.2*   No results for input(s): LIPASE, AMYLASE in the last 168 hours. No results for input(s): AMMONIA in the last 168 hours. Coagulation Profile: Recent Labs  Lab 05/07/24 0855  INR 1.1   Cardiac Enzymes: No results for input(s): CKTOTAL, CKMB, CKMBINDEX, TROPONINI in the last 168 hours. BNP (last 3 results) No results for input(s): PROBNP in the last 8760 hours. HbA1C: No results for input(s): HGBA1C in the last 72 hours. CBG: Recent Labs  Lab 05/09/24 1847 05/09/24 2052 05/10/24 0046 05/10/24 0615 05/10/24 1214  GLUCAP 117* 79 120* 109* 92   Lipid Profile: Recent Labs    05/08/24 0638  TRIG 138   Thyroid Function Tests: No results for input(s): TSH, T4TOTAL, FREET4, T3FREE, THYROIDAB in the last 72 hours. Anemia Panel: No results for input(s): VITAMINB12, FOLATE, FERRITIN, TIBC, IRON, RETICCTPCT in the last 72 hours. Most Recent Urinalysis On File:     Component Value Date/Time   COLORURINE STRAW (A) 04/27/2024 2041   APPEARANCEUR CLEAR (A) 04/27/2024 2041   APPEARANCEUR Clear 04/01/2013 1228   LABSPEC 1.017 04/27/2024 2041   LABSPEC 1.014 04/01/2013 1228   PHURINE 6.0 04/27/2024 2041   GLUCOSEU >=500 (A) 04/27/2024 2041  GLUCOSEU Negative 04/01/2013 1228   HGBUR NEGATIVE 04/27/2024 2041   BILIRUBINUR NEGATIVE 04/27/2024 2041   BILIRUBINUR Negative 04/01/2013 1228   KETONESUR 5 (A) 04/27/2024 2041   PROTEINUR 30 (A) 04/27/2024 2041   NITRITE NEGATIVE 04/27/2024 2041   LEUKOCYTESUR TRACE (A) 04/27/2024 2041   LEUKOCYTESUR Negative 04/01/2013 1228   Sepsis Labs: @LABRCNTIP (procalcitonin:4,lacticidven:4) Microbiology: No  results found for this or any previous visit (from the past 240 hours).    Radiology Studies last 3 days: US  EKG SITE RITE Result Date: 05/08/2024 If Site Rite image not attached, placement could not be confirmed due to current cardiac rhythm.      Time spent: 50 min     Keoni Risinger, DO [facility] 05/10/2024, 2:20 PM    Dictation software may have been used to generate the above note. Typos may occur and escape review in typed/dictated notes. Please contact Dr Marsa directly for clarity if needed.  Staff may message me via secure chat in Epic  but this may not receive an immediate response,  please page me for urgent matters!  If 7PM-7AM, please contact night coverage [email or web address]

## 2024-05-10 NOTE — Hospital Course (Addendum)
 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 Complaints of diffuse severe abdominal pain of unknown duration and hypertensive crisis. CTA C/A/P showed closed-loop SBO with developing ischemia. She received Kcentra for DOAC reversal and underwent emergent ex lap on 04/27/2024 with reduction of internal hernia, resection of approximately 110 cm distal small bowel with ileocecectomy, and ileocolic anastomosis. Status post transfusion of 1 unit pRBC on 11/3 for Hgb 6.4. Course complicated by postop ileus as of 11/05. IV Zosyn was stopped on 11/07 after 8 days JP drain removed on 11/08. Had hematochezia x 7 episodes on 11/9-11/10 AM with Hgb drop to nadir of 5.4. Transfused 2 units pRBC. Surgery aware, suspect bleeding at anastomosis site, inaccessible endoscopically. Has been on TPN and recently started on po diet but her intake has been poor. Has pulled PICC line few times now. Persistent Ileus and abd pain worse 11/13 and persistent into 11/16, have had to deescalate diet back to clears. 11/17 trial advance to full liquids. Pt still not moving around much and not taking much po into 11/18 and remains on TPN. TOC looking into LTACH     Consultants:  General Surgery   Procedures/Surgeries: 04/27/24: exploratory laparotomy with small bowel resection, ileocecectomy, ileocolonic anastomosis and reduction of internal hernia      ASSESSMENT & PLAN:   # Ischemic bowel status post ex lap with SB resection plus ileocecectomy and ileocolic anastomosis (10/30) # Course complicated by post-op ileus (11/05) # Persistent/recurrent ileus (KUB 11/13, 11/15) General surgery following  remains on TPN restart on CLD 11/13, advanced to FLD 11/17, continue  advancing as able  Question for feeding tube but given dementia she is a poor candidate for this - I have discussed w/ family that if she is not able to meet her caloric needs will need to discuss further but given has pulled PICC multiple times she will surely do same with tube.   Miralax  daily Staples removed 11/15 Persistent ileus and pt has plateau'ed here past 4-5 days, general surgery ok for LTACH and TOC is looking into whether she can go there to continue TPN / allow time for outcomes w/ goal eventual discharge vs transition to LTC   # Acute blood loss anemia - resolved # Hematochezia - resolved  Monitor with serial H/H Continue holding Eliquis   # History of PE s/p IVC filter Eliquis held due to hematochezia and acute blood loss anemia, continue hold d/t anemia and also in case needing further procedures    # Hypertensive urgency - resolved # Primary hypertension - stable  BP stable Continue amlodipine  10 mg daily and clonidine  patch   # AKI on CKD 3a - resolved Monitor BMP   #Chronic 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 Not oriented to situation.  Complicates her care - has pulled PICC several times which is needed for TPN at this point. She's needing sitter. Surely will pull a  feeding tube. Will discuss further with family. If dementia is limiting her ability to receive safe care / if treatments are causing distress, would deescalate to focus on treating as able and prioritizing QOL   # GOC Patient has no insight into the severity of her medical condition. Has no capacity to make her own medical decisions Decision-maker(s):  Pt's cousin [name] is usually at bedside - he is her financial POA and pt's brother Koren is her HCPOA. If we are unable to reach Koren, he assigns [employer] as education officer, environmental and he asks we do NOT involve patient's children so I have inactivated them in her contacts per his request.  goal to wean off TPN and advance diet - if not able to accomplish this such that pt is meeting nutritional requirements, then would need to talk about alternative plan and likely hospice Not a good candidate for feeding tube  D/w [name] and Koren (by phone in the room) at bedside 11/13 - confirmed for FULL CODE, I did advise that in situations of severe weakness/malnourishment, I strongly recommend against CPR. I do not believe patient is actively dying at this point, my greatest concern is if she is not improving her po intake then she will certainly continue to decline. Time will tell. Koren and [name] state that when she was lucid years ago she mentioned ~NOT~ wanting to be a DNR so they are going by her wishes, but are open to revisit the conversation if she is getting worse / has terminal illness.  See above re dementia     overweight based on BMI: Body mass index is 27.14 kg/m.SABRA Significantly low or high BMI is associated with higher medical risk.  Underweight - under 18  overweight - 25 to 29 obese - 30 or more Class 1 obesity: BMI of 30.0 to 34 Class 2 obesity: BMI of 35.0 to 39 Class 3 obesity: BMI of 40.0 to 49 Super Morbid Obesity: BMI 50-59 Super-super Morbid Obesity: BMI 60+ Healthy nutrition and physical activity advised as adjunct to other disease management and risk reduction treatments     DVT prophylaxis: holding d/t bleed risk IV fluids: no continuous IV fluids  Nutrition: TPN and advancing po as tolerate but intake has been poor as above. CLD for now  Unumprovident / other devices: PICC   Code Status: FULL CODE ACP documentation reviewed:  none on file in [facility]CA  TOC needs: TBD, potential hospice  / back to SNF w/ PT/OT if able. TOC looking into LTAC Medical barriers to dispo: TPN. Expected medical readiness for discharge pending po intake / GOC.

## 2024-05-10 NOTE — Progress Notes (Signed)
 y                                                                                                                                                                              Daily Progress Note   Patient Name: Kylie [name]       Date: 05/10/2024 DOB: Aug 24, 1955  Age: 68 y.o. MRN#: [id number] Attending Physician: Marsa Edelman, DO Primary Care Physician: [facility], [facility] Admit Date: 04/27/2024  Reason for Consultation/Follow-up: Establishing goals of care  Subjective: Notes and labs reviewed. In to see patient.  She is currently resting in bed at this time, no family at bedside.  She is able to tell me that she has a brother, and a cousin, and other family members I don't talk to.  She is able to tell me her name, and that we're at the place where they did surgery.  Beyond this patient is confused and unable to answer questions.  Breakfast tray is at bedside with what appears to be half a piece of bacon missing.  She insists that she is eating and drinking well, stating  I'm laying it away.  Attempted to discuss the lack of food missing, and she states the food is not good.  During my time at bedside she was holding the remote which doubles as a call bell up to her ear and speaking to it like a phone, as she is hearing the television through it.  Currently patient has an IV in place which is covered to prevent her from removing it.  With her dementia, she would be at high risk of pulling a PEG, and a PEG would not add to her quality of life.   Addendum: I was able to speak with cousin Kylie [name].  He advises that he and patient's brother who is H POA have been working together regarding her care for the past few years.  Discussed her current status and nutrition.  Discussed different scenarios. Discussed concerns for placing a feeding tube.  He states he is available to speak with attending and surgery service; epic chat sent.  Length of Stay:  13  Current Medications: Scheduled Meds:   sodium chloride    Intravenous Once   sodium chloride    Intravenous Once   acetaminophen   650 mg Oral Q6H   amLODipine   10 mg Oral Daily   Chlorhexidine  Gluconate Cloth  6 each Topical Daily   cloNIDine   0.1 mg Transdermal Weekly   feeding supplement  237 mL Oral TID BM   insulin  aspart  0-6 Units  Subcutaneous Q6H   irbesartan  150 mg Oral Daily   levothyroxine   100 mcg Oral q morning   melatonin  5 mg Oral QHS   sodium chloride  flush  10-40 mL Intracatheter Q12H    Continuous Infusions:  TPN ADULT (ION) 40 mL/hr at 05/09/24 1816   TPN ADULT (ION)      PRN Meds: OLANZapine  zydis, mouth rinse, oxyCODONE, sodium chloride  flush  Physical Exam Pulmonary:     Effort: Pulmonary effort is normal.  Neurological:     Mental Status: She is alert.             Vital Signs: BP 130/74 (BP Location: Right Arm)   Pulse 79   Temp (!) 97.4 F (36.3 C)   Resp 17   Ht 5' 10 (1.778 m)   Wt 85.8 kg   SpO2 100%   BMI 27.14 kg/m  SpO2: SpO2: 100 % O2 Device: O2 Device: Room Air O2 Flow Rate: O2 Flow Rate (L/min): 6 L/min  Intake/output summary:  Intake/Output Summary (Last 24 hours) at 05/10/2024 1228 Last data filed at 05/10/2024 9391 Gross per 24 hour  Intake 470.98 ml  Output --  Net 470.98 ml   LBM: Last BM Date : 05/10/24 Baseline Weight: Weight: 79.5 kg Most recent weight: Weight: 85.8 kg    Patient Active Problem List   Diagnosis Date Noted   Malnutrition of moderate degree 04/29/2024   Ischemic bowel disease 04/27/2024   Closed loop obstruction of intestine (HCC) 04/27/2024   Chronic combined systolic and diastolic CHF (congestive heart failure) (HCC) 04/27/2024   Leukocytosis 04/27/2024   HLD (hyperlipidemia) 04/27/2024   CAD (coronary artery disease) 04/27/2024   Depression 04/27/2024   Chronic kidney disease, stage 3a (HCC) 04/27/2024   Hypertensive urgency 04/27/2024   QT prolongation 04/27/2024   Hypokalemia  10/07/2023   Sepsis, severe, due to pneumonia (HCC) 10/06/2023   RSV (respiratory syncytial virus pneumonia) 10/06/2023   Acute respiratory failure with hypoxia (HCC) 10/06/2023   Rhabdomyolysis, traumatic 08/03/2023   Hypoglycemia 08/03/2023   S/p R nephrectomy for RCC 08/03/2023   History of pulmonary embolism 08/03/2023   History of CVA (cerebrovascular accident) 08/03/2023   Dementia (HCC)    Chronic anticoagulation due to history of pulmonary embolism 05/25/2017   CVA (cerebral vascular accident) (HCC) 05/25/2017   Hyperlipidemia 05/25/2017   Obesity (BMI 30.0-34.9) 10/22/2016   Type 2 diabetes mellitus without complication, without long-term current use of insulin  (HCC) 11/14/2014   Acquired hypothyroidism 02/19/2014   Allergic rhinitis 02/19/2014   Neuropathy 02/19/2014   Primary osteoarthritis of both knees 02/19/2014   Severe episode of recurrent major depressive disorder, without psychotic features (HCC) 02/19/2014   Vitamin B 12 deficiency 02/19/2014   Vitamin D  deficiency 02/19/2014   PE (pulmonary thromboembolism) (HCC) 10/03/2012   Malignant neoplasm of kidney (HCC) 03/21/2012   Gallstones without obstruction of gallbladder 05/08/2009   Sleep apnea 07/05/2000   Essential hypertension 12/06/1996    Palliative Care Assessment & Plan   Recommendations/Plan: Kylie [name] was updated at bedside.  Surgery service and attending team notified of his presence.  Code Status:    Code Status Orders  (From admission, onward)           Start     Ordered   04/27/24 2311  Full code  Continuous       Question:  By:  Answer:  Consent: discussion documented in EHR   04/27/24 2311  Code Status History     Date Active Date Inactive Code Status Order ID Comments User Context   10/06/2023 2250 10/11/2023 1912 Full Code [id number]  Cleatus Delayne GAILS, MD ED   08/03/2023 0403 08/05/2023 2021 Full Code [id number]  Cleatus Delayne GAILS, MD ED      Advance Directive  Documentation    Flowsheet Row Most Recent Value  Type of Advance Directive Healthcare Power of Attorney  Pre-existing out of facility DNR order (yellow form or pink MOST form) --  MOST Form in Place? --    Prognosis: Poor  Kylie Lewis, NP  Please contact Palliative Medicine Team phone at [phone] for questions and concerns.

## 2024-05-10 NOTE — Progress Notes (Signed)
 Physical Therapy Treatment Patient Details Name: Kylie [name] MRN: [id number] DOB: Oct 29, 1955 Today's Date: 05/10/2024   History of Present Illness Pt is a 68 y/o F admitted on 04/27/24 after presenting with c/o abdominal pain. Imaging showed ischemic bowel disease. Pt is s/p ex lap & small bowel resection with cecectomy on 04/27/24. PMH: dementia, HTN, HLD, sCHF, CVA, CKD 3A, depression, DM, PE on xarelto , s/p R nephrectomy (2009) 2/2 renal cell carcinoma    PT Comments  Pt was long sitting in bed with supportive cousin at bedside. Pt resistive at first but eventually agreeable to OOB activity. Pt's cognition/orientation seems to be inconsistent in presentation. She required much less assist to exit bed, stand, and tolerate ambulation versus earlier in the day. Author questions pt's effort earlier due to pt's abilities to safely exit bed, stand to RW, and tolerate ambulation to BR and return. She stood from toilet height surface during this session without physical assistance. The only complaint  pt had throughout session was about a chronic (per cousin) knee pain. Pain did not limit session progression. Acute PT will continue to follow per current POC progressing as able per pt tolerance. DC recs remain appropriate.     If plan is discharge home, recommend the following: A little help with walking and/or transfers;A little help with bathing/dressing/bathroom     Equipment Recommendations  Rolling Riva (2 wheels)       Precautions / Restrictions Precautions Precautions: Fall Recall of Precautions/Restrictions: Impaired Precaution/Restrictions Comments: log roll for comfort 2/2 abdominal incision Restrictions Weight Bearing Restrictions Per Provider Order: No     Mobility  Bed Mobility Overal bed mobility: Needs Assistance Bed Mobility: Supine to Sit, Sit to Supine  Supine to sit: Supervision Sit to supine: Supervision   Transfers Overall transfer level: Needs  assistance Equipment used: Rolling Soulliere (2 wheels) Transfers: Sit to/from Stand Sit to Stand: Contact guard assist, From elevated surface, Supervision  General transfer comment: Pt was able to stand from elevated bed height with CGA. Later in session stood from standard height toilet (no BSC) with supervision. pt is very inconsistent in presentation    Ambulation/Gait Ambulation/Gait assistance: Contact guard assist, Min assist Gait Distance (Feet): 30 Feet Assistive device: Rolling Stull (2 wheels) Gait Pattern/deviations: Decreased step length - right, Decreased step length - left, Decreased stride length Gait velocity: decreased  General Gait Details: Pt ambulated 2 x 30 ft with RW. no LOB however CGA for safety. Vcs for more upright posture and improved sequencing    Balance Overall balance assessment: Needs assistance Sitting-balance support: No upper extremity supported, Feet supported Sitting balance-Leahy Scale: Good     Standing balance support: Bilateral upper extremity supported Standing balance-Leahy Scale: Fair       Hotel Manager: Impaired Factors Affecting Communication: Reduced clarity of speech  Cognition Arousal: Alert Behavior During Therapy: WFL for tasks assessed/performed   PT - Cognitive impairments: History of cognitive impairments    PT - Cognition Comments: Pt is alert but presents with inconsistent cognition throughout session. Needed alot of encouragement to perfrom desired task requested of her. Eventually agreeable and then remained more cooperative Following commands: Impaired Following commands impaired: Follows one step commands with increased time, Follows one step commands inconsistently    Cueing Cueing Techniques: Verbal cues, Tactile cues         Pertinent Vitals/Pain Pain Assessment Pain Assessment: 0-10 Pain Score: 3  Pain Location: R knee ( chronic issue per pt and pt's cousin) Pain Descriptors /  Indicators: Grimacing, Guarding, Moaning Pain Intervention(s): Limited activity within patient's tolerance, Monitored during session, Premedicated before session, Repositioned     PT Goals (current goals can now be found in the care plan section) Acute Rehab PT Goals Patient Stated Goal: decreased pain Progress towards PT goals: Progressing toward goals    Frequency    Min 2X/week           Co-evaluation     PT goals addressed during session: Mobility/safety with mobility;Balance;Proper use of DME;Strengthening/ROM        AM-PAC PT 6 Clicks Mobility   Outcome Measure  Help needed turning from your back to your side while in a flat bed without using bedrails?: A Little Help needed moving from lying on your back to sitting on the side of a flat bed without using bedrails?: A Little Help needed moving to and from a bed to a chair (including a wheelchair)?: A Little Help needed standing up from a chair using your arms (e.g., wheelchair or bedside chair)?: A Little Help needed to walk in hospital room?: A Little Help needed climbing 3-5 steps with a railing? : A Little 6 Click Score: 18    End of Session   Activity Tolerance: Patient tolerated treatment well Patient left: in bed;with call bell/phone within reach;with bed alarm set;with family/visitor present (cousin at bedside) Nurse Communication: Mobility status PT Visit Diagnosis: Muscle weakness (generalized) (M62.81);Unsteadiness on feet (R26.81);Pain     Time: 8491-8465 PT Time Calculation (min) (ACUTE ONLY): 26 min  Charges:    $Gait Training: 8-22 mins $Therapeutic Activity: 8-22 mins PT General Charges $$ ACUTE PT VISIT: 1 Visit                    Rankin Essex PTA 05/10/24, 4:59 PM

## 2024-05-10 NOTE — Progress Notes (Signed)
 Nutrition Follow-up  DOCUMENTATION CODES:   Non-severe (moderate) malnutrition in context of chronic illness  INTERVENTION:   -Continue 48 hour calorie count -Continue TPN management per pharmacy (half rate as of 05/09/24) -Monitor Mg, K, and Phos and replete as needed secondary to high refeeding risk -Continue daily weights -Continue soft diet -Continue Ensure Plus High Protein po to TID, each supplement provides 350 kcal and 20 grams of protein  -Continue Magic cup TID with meals, each supplement provides 290 kcal and 9 grams of protein  -Continue feeding assistance with meals   NUTRITION DIAGNOSIS:   Moderate Malnutrition related to chronic illness as evidenced by moderate fat depletion, moderate muscle depletion.  Ongoing  GOAL:   Patient will meet greater than or equal to 90% of their needs  Un,met   MONITOR:   Diet advancement, Labs, Weight trends, Skin, I & O's, Other (Comment) (TPN)  REASON FOR ASSESSMENT:   Consult New TPN/TNA  ASSESSMENT:   68 y.o. female with h/o dementia, HTN, HLD, CHF, CVA, CKD-3a, depression, DM, PE on Xarelto , RCC s/p right nephrectomy (2009), hypothryoidism, CAD and OSA who is admitted with SBO secondary to Internal hernia resulting in small bowel ischemia and necrosis now s/p exploratory laparotomy, reduction of internal hernia, small bowel resection with cecectomy (~110cm ileum along with IC valve) and ileocolic anastomosis 10/31.   89/68- s/p s/p exploratory laparotomy with small bowel resection, ileocecectomy, ileocolonic anastomosis and reduction of internal hernia, PICC placed, TPN initiated 11/1- NGT removed by patient, pulled out PICC line 11/2- PICC line replaced 11/3- IV thiamine ordered 11/6- advanced to clear liquid diet 11/7- advanced to full liquid diet, penrose drain removed 11/11- advanced to soft diet, TPN at half rate   Reviewed I/O's: +471 ml x 24 hours and +12.7 L since admission  Patient sitting up in bed at  time of visit. Patient is pleasant and in good spirits today. She reports sleeping well and improved appetite. She states that she is ready to eat breakfast, but tray is untouched. RD encouraged to eat and offered to set up, however, patient replied I'll get to it later. RD discussed importance of eating to help support healing. After much encouragement, she consumed two sips of protein shake. Patient complains that her stomach feels funny after eating, but denies pain when eating. Patient does says that her stomach hurts intermittently.  Case discussed with general surgery; informed plan of calorie count/.   Case discussed with RN and MD; discussed concerns of lack oral intake and calorie count.   Palliative care following for goals of care; plan for family meeting today to further address goals of care. Per general surgery notes, may need to consider PEG, but is a poor candidate given dementia.   No weight loss noted over the past week.   11/11-11/12 Breakfast: 25 kcals, 1 gram protein Lunch: 63 kcals, 3 grams protein Dinner: 0 kcals, 0 grams protein  Total intake: 88 kcal (4% of minimum estimated needs)  4 grams protein (4% of minimum estimated needs)  Medications reviewed and include melatonin.   Labs reviewed: CBGS: 79-135 (inpatient orders for glycemic control are 0-6 units insulin  aspart every 6 hours).    Diet Order:   Diet Order             DIET SOFT Room service appropriate? Yes; Fluid consistency: Thin  Diet effective now                   EDUCATION NEEDS:  Not appropriate for education at this time  Skin:  Skin Assessment: Skin Integrity Issues: Skin Integrity Issues:: Incisions Incisions: closed abdomen  Last BM:  05/08/24 (type 7)  Height:   Ht Readings from Last 1 Encounters:  04/28/24 5' 10 (1.778 m)    Weight:   Wt Readings from Last 1 Encounters:  05/09/24 85.8 kg    Ideal Body Weight:  68.1 kg  BMI:  Body mass index is 27.14  kg/m.  Estimated Nutritional Needs:   Kcal:  2000-2300kcal/day  Protein:  100-115g/day  Fluid:  1.8-2.1L/day    Margery ORN, RD, LDN, CDCES Registered Dietitian III Certified Diabetes Care and Education Specialist If unable to reach this RD, please use RD Inpatient group chat on secure chat between hours of 8am-4 pm daily

## 2024-05-10 NOTE — Plan of Care (Signed)
  Problem: Education: Goal: Ability to describe self-care measures that may prevent or decrease complications (Diabetes Survival Skills Education) will improve Outcome: Progressing Goal: Individualized Educational Video(s) Outcome: Progressing   Problem: Coping: Goal: Ability to adjust to condition or change in health will improve Outcome: Progressing   Problem: Fluid Volume: Goal: Ability to maintain a balanced intake and output will improve Outcome: Progressing   Problem: Health Behavior/Discharge Planning: Goal: Ability to identify and utilize available resources and services will improve Outcome: Progressing Goal: Ability to manage health-related needs will improve Outcome: Progressing   Problem: Metabolic: Goal: Ability to maintain appropriate glucose levels will improve Outcome: Progressing   Problem: Nutritional: Goal: Maintenance of adequate nutrition will improve Outcome: Progressing Goal: Progress toward achieving an optimal weight will improve Outcome: Progressing   Problem: Skin Integrity: Goal: Risk for impaired skin integrity will decrease Outcome: Progressing   Problem: Tissue Perfusion: Goal: Adequacy of tissue perfusion will improve Outcome: Progressing   Problem: Education: Goal: Knowledge of General Education information will improve Description: Including pain rating scale, medication(s)/side effects and non-pharmacologic comfort measures Outcome: Progressing   Problem: Clinical Measurements: Goal: Ability to maintain clinical measurements within normal limits will improve Outcome: Progressing Goal: Will remain free from infection Outcome: Progressing Goal: Diagnostic test results will improve Outcome: Progressing Goal: Respiratory complications will improve Outcome: Progressing Goal: Cardiovascular complication will be avoided Outcome: Progressing   Problem: Health Behavior/Discharge Planning: Goal: Ability to manage health-related needs  will improve Outcome: Progressing   Problem: Activity: Goal: Risk for activity intolerance will decrease Outcome: Progressing   Problem: Nutrition: Goal: Adequate nutrition will be maintained Outcome: Progressing   Problem: Coping: Goal: Level of anxiety will decrease Outcome: Progressing   Problem: Elimination: Goal: Will not experience complications related to bowel motility Outcome: Progressing Goal: Will not experience complications related to urinary retention Outcome: Progressing   Problem: Pain Managment: Goal: General experience of comfort will improve and/or be controlled Outcome: Progressing   Problem: Safety: Goal: Ability to remain free from injury will improve Outcome: Progressing   Problem: Skin Integrity: Goal: Risk for impaired skin integrity will decrease Outcome: Progressing

## 2024-05-10 NOTE — Progress Notes (Signed)
 PHARMACY - TOTAL PARENTERAL NUTRITION CONSULT NOTE   Indication: Small bowel obstruction  Patient Measurements: Height: 5' 10 (177.8 cm) Weight: 85.8 kg (189 lb 2.5 oz) IBW/kg (Calculated) : 68.5 TPN AdjBW (KG): 79.6 Body mass index is 27.14 kg/m. Usual Weight: 75.8 kg (02/07/24)  Assessment:  Kylie [name] is a 68yoF that presented with upper abdominal pain. Patient's past medical history notable for dementia, HTN, HLD, sCHF with EF 40-45% (10/08/23), CVA, CKD-3a, depression, DM, PE on apixaban, and s/p right nephrectomy in 2009 for right renal cell carcinoma.  Glucose / Insulin : BG 91-135. (No SSI in last 24 hours)  Electrolytes: K 3.7, Mag 2.0, Phos 3.1, Corrected Ca 9.6 Renal:  Scr 0.87 > 0.80 > 0.79 Hepatic: Alk phos 80, AST 39, ALT 62, total bili 0.7, albumin 2.2 Intake / Output; MIVF: Net since admission: (+) 12.7L GI Imaging: 10/30 CT Angio Impression: Closed loop small bowel obstruction likely secondary to an internal hernia with findings concerning for developing bowel ischemia. Clinical correlation and surgical consult is advised. Status post right nephrectomy. 11/5 CTABD: Dilated small-bowel loops up to 4.3 cm without focal obstruction, consistent with ileus. Small amount of free fluid. GI Surgeries / Procedures:  10/31: Exploratory Laparotomy, reduction of internal hernia, small bowel resection with cecectomy (length 110 cm), ileocolic anastomosis  ID Zosyn 10/31 >> 11/7  Central access: yes TPN start date: 04/28/2024  Nutritional Goals: Goal TPN rate is 75 mL/hr (provides 108 g of protein and 2005 kcals per day)  RD Assessment: Estimated Needs Total Energy Estimated Needs: 2000-2300kcal/day Total Protein Estimated Needs: 100-115g/day Total Fluid Estimated Needs: 1.8-2.1L/day  Current Nutrition:  Full liquid 11/7 >> soft foods 11/11  Plan:  Per surgery, patient having bowel function but not eating much Continue TPN at 40 ml/hr (~half goal rate) at  1800 Electrolytes in TPN (standard): Na 3mEq/L, K 50mEq/L, Ca 51mEq/L, Mg 5mEq/L, and Phos 15mmol/L Cl:Ac 1:1 (watch) Add standard MVI and trace elements to TPN Thiamine 100mg  IV daily x 7 days per dietician (completed) Continue Sensitive  q6h SSI and adjust as needed  Monitor TPN labs on Mon/Thurs  Thank you for involving pharmacy in this patient's care.   Damien Napoleon, PharmD Clinical Pharmacist 05/10/2024 9:40 AM

## 2024-05-11 ENCOUNTER — Inpatient Hospital Stay

## 2024-05-11 DIAGNOSIS — K559 Vascular disorder of intestine, unspecified: Secondary | ICD-10-CM | POA: Diagnosis not present

## 2024-05-11 DIAGNOSIS — Z7189 Other specified counseling: Secondary | ICD-10-CM | POA: Diagnosis not present

## 2024-05-11 LAB — GLUCOSE, CAPILLARY
Glucose-Capillary: 117 mg/dL — ABNORMAL HIGH (ref 70–99)
Glucose-Capillary: 118 mg/dL — ABNORMAL HIGH (ref 70–99)
Glucose-Capillary: 135 mg/dL — ABNORMAL HIGH (ref 70–99)
Glucose-Capillary: 152 mg/dL — ABNORMAL HIGH (ref 70–99)

## 2024-05-11 LAB — COMPREHENSIVE METABOLIC PANEL WITH GFR
ALT: 68 U/L — ABNORMAL HIGH (ref 0–44)
AST: 31 U/L (ref 15–41)
Albumin: 2.9 g/dL — ABNORMAL LOW (ref 3.5–5.0)
Alkaline Phosphatase: 105 U/L (ref 38–126)
Anion gap: 7 (ref 5–15)
BUN: 19 mg/dL (ref 8–23)
CO2: 22 mmol/L (ref 22–32)
Calcium: 8.3 mg/dL — ABNORMAL LOW (ref 8.9–10.3)
Chloride: 108 mmol/L (ref 98–111)
Creatinine, Ser: 0.73 mg/dL (ref 0.44–1.00)
GFR, Estimated: >60 mL/min (ref >60)
Glucose, Bld: 111 mg/dL — ABNORMAL HIGH (ref 70–99)
Potassium: 4.2 mmol/L (ref 3.5–5.1)
Sodium: 138 mmol/L (ref 135–145)
Total Bilirubin: 0.2 mg/dL (ref 0.0–1.2)
Total Protein: 5.5 g/dL — ABNORMAL LOW (ref 6.5–8.1)

## 2024-05-11 LAB — MAGNESIUM: Magnesium: 1.9 mg/dL (ref 1.7–2.4)

## 2024-05-11 LAB — CBC
HCT: 24.5 % — ABNORMAL LOW (ref 36.0–46.0)
Hemoglobin: 8 g/dL — ABNORMAL LOW (ref 12.0–15.0)
MCH: 31.7 pg (ref 26.0–34.0)
MCHC: 32.7 g/dL (ref 30.0–36.0)
MCV: 97.2 fL (ref 80.0–100.0)
Platelets: 302 K/uL (ref 150–400)
RBC: 2.52 MIL/uL — ABNORMAL LOW (ref 3.87–5.11)
RDW: 15 % (ref 11.5–15.5)
WBC: 12.6 K/uL — ABNORMAL HIGH (ref 4.0–10.5)
nRBC: 0 % (ref 0.0–0.2)

## 2024-05-11 LAB — PHOSPHORUS: Phosphorus: 3.2 mg/dL (ref 2.5–4.6)

## 2024-05-11 MED ORDER — TRACE MINERALS CU-MN-SE-ZN 300-55-60-3000 MCG/ML IV SOLN
INTRAVENOUS | Status: AC
  Filled 2024-05-11: qty 384

## 2024-05-11 MED ORDER — KETOROLAC TROMETHAMINE 15 MG/ML IJ SOLN
15.0000 mg | Freq: Four times a day (QID) | INTRAMUSCULAR | Status: AC | PRN
  Administered 2024-05-11 – 2024-05-16 (×9): 15 mg via INTRAVENOUS
  Filled 2024-05-11 (×10): qty 1

## 2024-05-11 MED ORDER — BOOST / RESOURCE BREEZE PO LIQD CUSTOM
1.0000 | Freq: Three times a day (TID) | ORAL | Status: DC
  Administered 2024-05-11 – 2024-05-15 (×10): 1 via ORAL

## 2024-05-11 MED ORDER — POLYETHYLENE GLYCOL 3350 17 G PO PACK
17.0000 g | PACK | Freq: Every day | ORAL | Status: DC
  Administered 2024-05-11 – 2024-05-19 (×9): 17 g via ORAL
  Filled 2024-05-11 (×9): qty 1

## 2024-05-11 NOTE — Progress Notes (Addendum)
 Daily Progress Note   Patient Name: Kylie [name]       Date: 05/11/2024 DOB: [birth year]  Age: 68 y.o. MRN#: [id number] Attending Physician: Marsa Edelman, DO Primary Care Physician: Care, [facility] Admit Date: 04/27/2024  Reason for Consultation/Follow-up: Establishing goals of care  Subjective: Notes and labs reviewed.  In to see patient.  She is currently resting in bed at this time with her cousin [name] at bedside.  He discusses that he has actively participated in her care for the past few years.  He discusses that patient's brother is HPOA, as he did not want to be patient's durable and H POA at the same time.  He has been updated by medical team.  We discussed her status and care moving forward.  We discussed the risks of a PEG tube if that decision is made in the future.  At this time, he is amenable to initiating an appetite stimulant when it is medically appropriate to do so.  Revisited previous discussion had by PMT team earlier in the admission, regarding any boundaries she may have on her care.  He confirms that he and the family continues to desire full code and full scope treatment.   [name] states he will be present at the bedside over the next few days.  Called to speak with brother [name] at the number listed.  He confirms that he is HPOA.  He states he and [name] work together closely on patient's health care decisions.  [name] states if he is not available to make healthcare decisions, he is amenable to cousin [name] making decisions, and is also amenable to having cousin [name] as point of contact between family and hospital.   Length of Stay: 14  Current Medications: Scheduled Meds:   sodium chloride    Intravenous Once   sodium chloride    Intravenous  Once   acetaminophen   650 mg Oral Q6H   amLODipine   10 mg Oral Daily   Chlorhexidine  Gluconate Cloth  6 each Topical Daily   cloNIDine   0.1 mg Transdermal Weekly   feeding supplement  237 mL Oral TID BM   insulin  aspart  0-6 Units Subcutaneous Q6H   irbesartan  150 mg Oral Daily   levothyroxine   100 mcg Oral q morning   melatonin  5 mg Oral QHS   polyethylene  glycol  17 g Oral Daily   sodium chloride  flush  10-40 mL Intracatheter Q12H    Continuous Infusions:  TPN ADULT (ION) 40 mL/hr at 05/10/24 1818   TPN ADULT (ION)      PRN Meds: OLANZapine  zydis, mouth rinse, oxyCODONE, sodium chloride  flush  Physical Exam Pulmonary:     Effort: Pulmonary effort is normal.  Skin:    General: Skin is warm and dry.  Neurological:     Mental Status: She is alert.             Vital Signs: BP 134/68 (BP Location: Right Arm)   Pulse 84   Temp 98.4 F (36.9 C)   Resp 19   Ht 5' 10 (1.778 m)   Wt 86.2 kg   SpO2 100%   BMI 27.27 kg/m  SpO2: SpO2: 100 % O2 Device: O2 Device: Room Air O2 Flow Rate: O2 Flow Rate (L/min): 6 L/min  Intake/output summary:  Intake/Output Summary (Last 24 hours) at 05/11/2024 1148 Last data filed at 05/11/2024 1045 Gross per 24 hour  Intake 1438.52 ml  Output --  Net 1438.52 ml   LBM: Last BM Date : 05/10/24 Baseline Weight: Weight: 79.5 kg Most recent weight: Weight: 86.2 kg   Patient Active Problem List   Diagnosis Date Noted   Malnutrition of moderate degree 04/29/2024   Ischemic bowel disease 04/27/2024   Closed loop obstruction of intestine (HCC) 04/27/2024   Chronic combined systolic and diastolic CHF (congestive heart failure) (HCC) 04/27/2024   Leukocytosis 04/27/2024   HLD (hyperlipidemia) 04/27/2024   CAD (coronary artery disease) 04/27/2024   Depression 04/27/2024   Chronic kidney disease, stage 3a (HCC) 04/27/2024   Hypertensive urgency 04/27/2024   QT prolongation 04/27/2024   Hypokalemia 10/07/2023   Sepsis, severe, due to  pneumonia (HCC) 10/06/2023   RSV (respiratory syncytial virus pneumonia) 10/06/2023   Acute respiratory failure with hypoxia (HCC) 10/06/2023   Rhabdomyolysis, traumatic 08/03/2023   Hypoglycemia 08/03/2023   S/p R nephrectomy for RCC 08/03/2023   History of pulmonary embolism 08/03/2023   History of CVA (cerebrovascular accident) 08/03/2023   Dementia (HCC)    Chronic anticoagulation due to history of pulmonary embolism 05/25/2017   CVA (cerebral vascular accident) (HCC) 05/25/2017   Hyperlipidemia 05/25/2017   Obesity (BMI 30.0-34.9) 10/22/2016   Type 2 diabetes mellitus without complication, without long-term current use of insulin  (HCC) 11/14/2014   Acquired hypothyroidism 02/19/2014   Allergic rhinitis 02/19/2014   Neuropathy 02/19/2014   Primary osteoarthritis of both knees 02/19/2014   Severe episode of recurrent major depressive disorder, without psychotic features (HCC) 02/19/2014   Vitamin B 12 deficiency 02/19/2014   Vitamin D  deficiency 02/19/2014   PE (pulmonary thromboembolism) (HCC) 10/03/2012   Malignant neoplasm of kidney (HCC) 03/21/2012   Gallstones without obstruction of gallbladder 05/08/2009   Sleep apnea 07/05/2000   Essential hypertension 12/06/1996    Palliative Care Assessment & Plan    Recommendations/Plan: Full code/ full scope H POA forms are present in patient's chart listing brother [name] as patient's H POA . Called to speak with brother [name] at the number listed.  He confirms that he is HPOA.  He states he and [name] work together closely on patient's health care decisions.  [name] states if he is not available to make healthcare decisions, he is amenable to cousin [name] making decisions, and is also amenable to having cousin [name] as point of contact between family and hospital.  Would consider Reglan to help with bowel  function.  Would consider weaning oxycodone and initiating Toradol  in addition to Tylenol  for pain.  Recommendations  relayed to attending team.  Code Status:    Code Status Orders  (From admission, onward)           Start     Ordered   04/27/24 2311  Full code  Continuous       Question:  By:  Answer:  Consent: discussion documented in EHR   04/27/24 2311           Code Status History     Date Active Date Inactive Code Status Order ID Comments User Context   10/06/2023 2250 10/11/2023 1912 Full Code [id number]  Cleatus Delayne GAILS, MD ED   08/03/2023 0403 08/05/2023 2021 Full Code [id number]  Cleatus Delayne GAILS, MD ED      Advance Directive Documentation    Flowsheet Row Most Recent Value  Type of Advance Directive Healthcare Power of Attorney  Pre-existing out of facility DNR order (yellow form or pink MOST form) --  MOST Form in Place? --    Camelia Lewis, NP  Please contact Palliative Medicine Team phone at [phone] for questions and concerns.

## 2024-05-11 NOTE — Plan of Care (Signed)

## 2024-05-11 NOTE — Progress Notes (Signed)
 05/11/2024  Subjective: Patient is 14 Days Post-Op.  Patient reports abdominal pain at the midline incision.  Denies flatus/BM, last BM recorded on 11/10.  Hgb slowly trending up, WBC lightly down today.  Continues on soft diet and half TPN, on calorie counts.  Vital signs: Temp:  [97.4 F (36.3 C)-98.4 F (36.9 C)] 97.6 F (36.4 C) (11/13 0510) Pulse Rate:  [79-91] 83 (11/13 0510) Resp:  [17-18] 17 (11/13 0510) BP: (124-146)/(68-75) 124/68 (11/13 0510) SpO2:  [100 %] 100 % (11/13 0510) Weight:  [86.2 kg] 86.2 kg (11/13 0510)   Intake/Output: 11/12 0701 - 11/13 0700 In: 1198.5 [P.O.:357; I.V.:841.5] Out: -  Last BM Date : 05/10/24  Physical Exam: Constitutional:  No acute distress Abdomen:  soft, does not appear distended, with localized tenderness in midline.  Incision clean, dry, intact, without evidence of infection.  Staples in place.  Labs:  Recent Labs    05/10/24 0453 05/11/24 0553  WBC 13.8* 12.6*  HGB 7.7* 8.0*  HCT 23.0* 24.5*  PLT 278 302   Recent Labs    05/08/24 0733 05/11/24 0553  NA 143 138  K 3.7 4.2  CL 114* 108  CO2 20* 22  GLUCOSE 86 111*  BUN 30* 19  CREATININE 0.81 0.73  CALCIUM  8.1* 8.3*   No results for input(s): LABPROT, INR in the last 72 hours.  Imaging: No results found.  Assessment/Plan: This is a 68 y.o. female s/p exlap, small bowel resection, cecectomy, with ileocolonic anastomosis.  --As a precaution, will order KUB this morning to evaluate for any further ileus.  She did have ileus on last CT scan a week ago.  Will also order Miralax . --Continue soft diet and TPN.  If calorie counts show that she's having enough po intake, would be feasible to d/c TPN. --Consider appetite stimulants.   Kylie [name], MD [facility]

## 2024-05-11 NOTE — Progress Notes (Signed)
 Nutrition Follow-up  DOCUMENTATION CODES:   Non-severe (moderate) malnutrition in context of chronic illness  INTERVENTION:   -D/c calorie count -Continue TPN management per pharmacy (increase to full rate on 05/12/24) -Monitor Mg, K, and Phos and replete as needed secondary to high refeeding risk -Continue daily weights -Downgrade diet to clear liquid per general surgery -D/c Ensure Plus High Protein po to TID, each supplement provides 350 kcal and 20 grams of protein  -D/c Magic cup TID with meals, each supplement provides 290 kcal and 9 grams of protein  -Continue feeding assistance with meals  -Boost Breeze po TID, each supplement provides 250 kcal and 9 grams of protein  -If PEG is desired, recommend:   Initiate Osmolite 1.5 @ 20 ml/hr and increase by 10 ml every 12 hours to goal rate of 50 ml/hr.   60 ml Prosource TF20 BID  Tube feeding regimen provides 1960 kcal (100% of needs), 115 grams of protein, and 914 ml of H2O.    NUTRITION DIAGNOSIS:   Moderate Malnutrition related to chronic illness as evidenced by moderate fat depletion, moderate muscle depletion.  Ongoing  GOAL:   Patient will meet greater than or equal to 90% of their needs  Progressing   MONITOR:   Diet advancement, Labs, Weight trends, Skin, I & O's, Other (Comment) (TPN)  REASON FOR ASSESSMENT:   Consult New TPN/TNA  ASSESSMENT:   68 y.o. female with h/o dementia, HTN, HLD, CHF, CVA, CKD-3a, depression, DM, PE on Xarelto , RCC s/p right nephrectomy (2009), hypothryoidism, CAD and OSA who is admitted with SBO secondary to Internal hernia resulting in small bowel ischemia and necrosis now s/p exploratory laparotomy, reduction of internal hernia, small bowel resection with cecectomy (~110cm ileum along with IC valve) and ileocolic anastomosis 10/31.  10/31- s/p s/p exploratory laparotomy with small bowel resection, ileocecectomy, ileocolonic anastomosis and reduction of internal hernia, PICC  placed, TPN initiated 11/1- NGT removed by patient, pulled out PICC line 11/2- PICC line replaced 11/3- IV thiamine ordered 11/6- advanced to clear liquid diet 11/7- advanced to full liquid diet, penrose drain removed 11/11- advanced to soft diet, TPN at half rate   Reviewed I/O's: +1.2 L x 24 hours and +13.9 L since admission  Plan for KUB to evaluate for ileus.   Spoke with patient at bedside. She reports she has not eaten anything today or yesterday. She continues to complain of stomach pain when she eats and overall pain where her staples are. She states she is ready to eat, but does not like the food. She states that she wants something good like a cheeseburger. RD liberalized diet to regular for wider variety of meal selections.   Per pharmacy, plan to continue TPN half rate  at 40 ml/hr, which provides 1069 kcals and 58 grams protein, meeting 53% of estimated kcal needs and 58% of estimated protein needs.   11/11-11/12 Breakfast: 25 kcals, 1 gram protein Lunch: 63 kcals, 3 grams protein Dinner: 0 kcals, 0 grams protein   Total intake: 88 kcal (4% of minimum estimated needs)  4 grams protein (4% of minimum estimated needs)  11/12-11/13 Breakfast: 0 kcals, 0 gram protein Lunch: 0 kcals, 0 grams protein Dinner: 0 kcals, 0 grams protein   Total intake: 0 kcal (0% of minimum estimated needs)  0 grams protein (0% of minimum estimated needs)  Average Total intake: 44 kcal (2% of minimum estimated needs)  2 grams protein (2% of minimum estimated needs)  Discussed calorie count results with RN,  MD, palliative care, and general surgery: patient is not meeting nutritional goals via oral intake. Reviewed interventions, liberalizing diet, and RD personally obtaining meal order for lunch. Patient is on multiple supplements with minimal acceptance. Unless TPN is continued of PEG is pursued, there is nothing further that RD can offer from a nutritional standpoint. RD acknowledges that  neither TPN nor TF would be a great option, as TPN would complicate hospital discharge (patient resides in a SNF) and RD would be hesitant to recommend PEG due to history of dementia.   ADDENDUM: per general surgery; KUB revealed ileus and plan to resume full strength TPN and decrease to clear liquids. Per RN, patient consumed some of her meal and Ensure today, with family assisting. RD decreased diet to clear liquids per surgery request and modified supplements per diet order.   Per pharmacy, TPN will resume at full rate tomorrow as TPN for today has already been ordered.   Per palliative care, family desires full scope care.   Medications reviewed and include melatonin and miralax .   Labs reviewed: CBGS: 79-129 (inpatient orders for glycemic control are 0-6 units insulin  aspart every 6 hours).    Diet Order:   Diet Order             Diet clear liquid Room service appropriate? Yes; Fluid consistency: Thin  Diet effective now                   EDUCATION NEEDS:   Not appropriate for education at this time  Skin:  Skin Assessment: Skin Integrity Issues: Skin Integrity Issues:: Incisions Incisions: closed abdomen  Last BM:  05/10/24  Height:   Ht Readings from Last 1 Encounters:  04/28/24 5' 10 (1.778 m)    Weight:   Wt Readings from Last 1 Encounters:  05/11/24 86.2 kg    Ideal Body Weight:  68.1 kg  BMI:  Body mass index is 27.27 kg/m.  Estimated Nutritional Needs:   Kcal:  2000-2300kcal/day  Protein:  100-115g/day  Fluid:  1.8-2.1L/day    Margery ORN, RD, LDN, CDCES Registered Dietitian III Certified Diabetes Care and Education Specialist If unable to reach this RD, please use RD Inpatient group chat on secure chat between hours of 8am-4 pm daily

## 2024-05-11 NOTE — Progress Notes (Addendum)
 PHARMACY - TOTAL PARENTERAL NUTRITION CONSULT NOTE   Indication: Small bowel obstruction  Patient Measurements: Height: 5' 10 (177.8 cm) Weight: 86.2 kg (190 lb 0.6 oz) IBW/kg (Calculated) : 68.5 TPN AdjBW (KG): 79.6 Body mass index is 27.27 kg/m. Usual Weight: 75.8 kg (02/07/24)  Assessment:  Kylie [name] is a 68yoF that presented with upper abdominal pain. Patient's past medical history notable for dementia, HTN, HLD, sCHF with EF 40-45% (10/08/23), CVA, CKD-3a, depression, DM, PE on apixaban, and s/p right nephrectomy in 2009 for right renal cell carcinoma.  Glucose / Insulin : BG 92-118. (No SSI in last 24 hours)  Electrolytes: K 4.2, Mag 1.9, Phos 3.2, Corrected Ca 9.2 Renal: Scr 0.80 > 0.79 > 0.79 Hepatic: Alk phos 105, AST 31, ALT 68, total bili 0.2, albumin 2.2 Intake / Output; MIVF: Net since admission: (+) 13.9L GI Imaging: 10/30 CT Angio Impression: Closed loop small bowel obstruction likely secondary to an internal hernia with findings concerning for developing bowel ischemia. Clinical correlation and surgical consult is advised. Status post right nephrectomy. 11/5 CTABD: Dilated small-bowel loops up to 4.3 cm without focal obstruction, consistent with ileus. Small amount of free fluid. GI Surgeries / Procedures:  10/31: Exploratory Laparotomy, reduction of internal hernia, small bowel resection with cecectomy (length 110 cm), ileocolic anastomosis  ID [email or web address] 10/31 >> 11/7  Central access: yes TPN start date: 04/28/2024  Nutritional Goals: Goal TPN rate is 75 mL/hr (provides 108 g of protein and 2005 kcals per day)  RD Assessment: Estimated Needs Total Energy Estimated Needs: 2000-2300kcal/day Total Protein Estimated Needs: 100-115g/day Total Fluid Estimated Needs: 1.8-2.1L/day  Current Nutrition:  Full liquid 11/7 >> soft foods 11/11  Plan:  Per surgery, patient having bowel function but not eating much Continue TPN at 40 ml/hr (~half goal rate) at  1800 Electrolytes in TPN (standard): Na 50mEq/L, K 50mEq/L, Ca 60mEq/L, Mg 64mEq/L, and Phos 15mmol/L Cl:Ac 1:1 (watch) Add standard MVI and trace elements to TPN Thiamine 100mg  IV daily x 7 days per dietician (completed) Continue Sensitive  q6h SSI and adjust as needed  Monitor TPN labs on Mon/Thurs - check Mag in AM  Thank you for involving pharmacy in this patient's care.   Damien Napoleon, PharmD Clinical Pharmacist 05/11/2024 9:05 AM

## 2024-05-11 NOTE — Progress Notes (Signed)
 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 Complaints of diffuse severe abdominal pain of unknown duration and hypertensive crisis. CTA C/A/P showed closed-loop SBO with developing ischemia. She received Kcentra for DOAC reversal and underwent emergent ex lap on 04/27/2024 with reduction of internal hernia, resection of approximately 110 cm distal small bowel with ileocecectomy, and ileocolic anastomosis. Status post transfusion of 1 unit pRBC on 11/3 for Hgb 6.4. Course complicated by postop ileus as of 11/05. IV Zosyn was stopped on 11/07 after 8 days JP drain removed on 11/08. Had hematochezia x 7 episodes on 11/9-11/10 AM with Hgb drop to nadir of 5.4. Transfused 2 units pRBC. Surgery aware, suspect bleeding at anastomosis site, inaccessible endoscopically. Has been on TPN and recently started on po diet but her intake has been poor.      Consultants:  General Surgery   Procedures/Surgeries: 04/27/24: exploratory laparotomy with small bowel resection, ileocecectomy, ileocolonic anastomosis and reduction of internal hernia       ASSESSMENT & PLAN:   # Ischemic bowel status post ex lap with SB resection plus ileocecectomy and ileocolic anastomosis (10/30) # Course complicated by post-op ileus (11/05) # Persistent/recurrent ileus (KUB 11/13) General surgery following  remains on TPN Poor po intake, had decent intake w/ lunch today, KUB concerning for recurrent ileus, per surgery restart on CLD for now  Question for feeding tube but given dementia she is a  poor candidate for this - I have discussed w/ family that if she is not able to meet her caloric needs will need to discuss further    # Acute blood loss anemia - resolved # Hematochezia - resolved  Monitor with serial H/H Continue holding Eliquis   # History of PE s/p IVC filter Eliquis held due to hematochezia and acute blood loss anemia, continue hold d/t anemia and also in case needing further procedures    # Hypertensive urgency - resolved # Primary hypertension - stable  BP stable Continue amlodipine  10 mg daily and clonidine  patch   # AKI on CKD 3a - resolved Monitor BMP   #Chronic 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 Not oriented to situation.    # GOC Has no insight into the severity of her medical condition.  Has no capacity to make her own medical decisions Decision-maker(s): Pt's cousin [name] is usually at bedside - he is her financial POA and pt's brother Koren is her HCPOA. Koren assigns [name] as education officer, environmental if we cannot reach him, he asks we do NOT involve her children so I have removed them from her contacts per his request Palliative and TOC following  goal to wean off TPN and advance diet - if not able to accomplish this such that pt is meeting nutritional requirements, then would need to talk about alternative plan and likely hospice Not a good candidate for feeding tube  D/w [name] and Koren (by phone in the room) at bedside  today - confirmed for FULL CODE, I did advise that in situations of severe weakness/malnourishment, I strongly recommend against CPR. I do not believe patient is actively dying at this point, my greatest concern is if she is not improving her po intake then she will certainly continue to decline. Time will tell. Koren and [name] state that when she was lucid years ago she mentioned not wanting to  be a DNR so they are going by her wishes but are willing to revisit the conversation if she is getting worse / has terminal illness.     overweight based on BMI: Body mass index is 27.14 kg/m.SABRA Significantly low or high BMI is associated with higher medical risk.  Underweight - under 18  overweight - 25 to 29 obese - 30 or more Class 1 obesity: BMI of 30.0 to 34 Class 2 obesity: BMI of 35.0 to 39 Class 3 obesity: BMI of 40.0 to 49 Super Morbid Obesity: BMI 50-59 Super-super Morbid Obesity: BMI 60+ Healthy nutrition and physical activity advised as adjunct to other disease management and risk reduction treatments    DVT prophylaxis: holding d/t bleed risk IV fluids: no continuous IV fluids  Nutrition: TPN and advancing po as tolerate but intake has been poor as above. CLD today deescalated from solids  Central lines / other devices: PICC placed 05/08/24  Code Status: FULL CODE ACP documentation reviewed:  none on file in [facility]  TOC needs: TBD, potential hospice / back to SNF w/ PT/OT if able  Medical barriers to dispo: TPN. Expected medical readiness for discharge pending po intake / GOC.              Subjective / Brief ROS:  Patient has no complaitns. She is not oriented but she is pleasant    Family Communication: [name], pt's cousin at bedside and he called Koren, pt's brother, we all spoke on speakerphone     Objective Findings:  Vitals:   05/10/24 1700 05/10/24 1939 05/11/24 0510 05/11/24 0827  BP: (!) 141/75 (!) 146/69 124/68 134/68  Pulse: 90 91 83 84  Resp: 18 18 17 19   Temp:  98.4 F (36.9 C) 97.6 F (36.4 C) 98.4 F (36.9 C)  TempSrc:  Oral Oral   SpO2: 100% 100% 100% 100%  Weight:   86.2 kg   Height:        Intake/Output Summary (Last 24 hours) at 05/11/2024 1351 Last data filed at 05/11/2024 1045 Gross per 24 hour  Intake 1438.52 ml  Output --  Net 1438.52 ml   Filed Weights   05/08/24 0500 05/09/24 0406 05/11/24 0510  Weight: 84 kg  85.8 kg 86.2 kg    Examination:  Physical Exam Constitutional:      General: She is not in acute distress. Cardiovascular:     Rate and Rhythm: Normal rate and regular rhythm.     Heart sounds: Murmur heard.  Pulmonary:     Effort: Pulmonary effort is normal.     Breath sounds: Normal breath sounds.  Abdominal:     General: Bowel sounds are normal.     Palpations: Abdomen is soft.     Tenderness: There is generalized abdominal tenderness.  Neurological:     Mental Status: She is alert. She is disoriented.  Psychiatric:        Mood and Affect: Mood normal.        Behavior: Behavior normal.          Scheduled Medications:   sodium chloride   Intravenous Once   sodium chloride    Intravenous Once   acetaminophen   650 mg Oral Q6H   amLODipine   10 mg Oral Daily   Chlorhexidine  Gluconate Cloth  6 each Topical Daily   cloNIDine   0.1 mg Transdermal Weekly   feeding supplement  1 Container Oral TID BM   insulin  aspart  0-6 Units Subcutaneous Q6H   irbesartan  150 mg Oral Daily   levothyroxine   100 mcg Oral q morning   melatonin  5 mg Oral QHS   polyethylene glycol  17 g Oral Daily   sodium chloride  flush  10-40 mL Intracatheter Q12H    Continuous Infusions:  TPN ADULT (ION) 40 mL/hr at 05/10/24 1818   TPN ADULT (ION)      PRN Medications:  OLANZapine  zydis, mouth rinse, oxyCODONE, sodium chloride  flush  Antimicrobials from admission:  Anti-infectives (From admission, onward)    Start     Dose/Rate Route Frequency Ordered Stop   04/28/24 0600  piperacillin-tazobactam (ZOSYN) IVPB 3.375 g  Status:  Discontinued        3.375 g 12.5 mL/hr over 240 Minutes Intravenous Every 8 hours 04/28/24 0256 05/05/24 1340   04/27/24 2300  cefoTEtan (CEFOTAN) 2 g in sodium chloride  0.9 % 100 mL IVPB        2 g 200 mL/hr over 30 Minutes Intravenous On call to O.R. 04/27/24 2255 04/28/24 1319           Data Reviewed:  I have personally reviewed the  following...  CBC: Recent Labs  Lab 05/07/24 0857 05/07/24 1518 05/07/24 2249 05/08/24 9361 05/08/24 1001 05/09/24 0536 05/10/24 0453 05/11/24 0553  WBC 13.8*  --   --  14.4*  --  13.8* 13.8* 12.6*  HGB 5.4*   < > 8.8* 7.9* 8.0* 7.5* 7.7* 8.0*  HCT 17.7*  --  26.7* 23.9*  --  22.5* 23.0* 24.5*  MCV 102.9*  --   --  96.0  --  96.2 96.2 97.2  PLT 273  --   --  261  --  280 278 302   < > = values in this interval not displayed.   Basic Metabolic Panel: Recent Labs  Lab 05/07/24 0535 05/07/24 0857 05/07/24 1020 05/08/24 0638 05/08/24 0733 05/11/24 0553  NA 137 141  --  145 143 138  K 6.8* 4.2  --  3.6 3.7 4.2  CL 110 114*  --  116* 114* 108  CO2 18* 21*  --  20* 20* 22  GLUCOSE 908* 151*  --  90 86 111*  BUN 31* 31*  --  31* 30* 19  CREATININE 0.87 0.80  --  0.79 0.81 0.73  CALCIUM  8.1* 7.9*  --  8.2* 8.1* 8.3*  MG 2.3  --   --  2.0  --  1.9  PHOS 6.1*  --  3.5 3.1  --  3.2   GFR: Estimated Creatinine Clearance: 78.1 mL/min (by C-G formula based on SCr of 0.73 mg/dL). Liver Function Tests: Recent Labs  Lab 05/08/24 9361 05/11/24 0553  AST 39 31  ALT 62* 68*  ALKPHOS 80 105  BILITOT 0.7 0.2  PROT 5.4* 5.5*  ALBUMIN 2.2* 2.9*   No results for input(s): LIPASE, AMYLASE in the last 168 hours. No results for input(s): AMMONIA in the last 168 hours. Coagulation Profile: Recent Labs  Lab 05/07/24 0855  INR 1.1   Cardiac Enzymes: No results for input(s): CKTOTAL, CKMB, CKMBINDEX, TROPONINI in the last 168 hours. BNP (last  3 results) No results for input(s): PROBNP in the last 8760 hours. HbA1C: No results for input(s): HGBA1C in the last 72 hours. CBG: Recent Labs  Lab 05/10/24 1710 05/10/24 2023 05/11/24 0028 05/11/24 0604 05/11/24 1130  GLUCAP 129* 118* 135* 118* 152*   Lipid Profile: No results for input(s): CHOL, HDL, LDLCALC, TRIG, CHOLHDL, LDLDIRECT in the last 72 hours.  Thyroid Function Tests: No results for  input(s): TSH, T4TOTAL, FREET4, T3FREE, THYROIDAB in the last 72 hours. Anemia Panel: No results for input(s): VITAMINB12, FOLATE, FERRITIN, TIBC, IRON, RETICCTPCT in the last 72 hours. Most Recent Urinalysis On File:     Component Value Date/Time   COLORURINE STRAW (A) 04/27/2024 2041   APPEARANCEUR CLEAR (A) 04/27/2024 2041   APPEARANCEUR Clear 04/01/2013 1228   LABSPEC 1.017 04/27/2024 2041   LABSPEC 1.014 04/01/2013 1228   PHURINE 6.0 04/27/2024 2041   GLUCOSEU >=500 (A) 04/27/2024 2041   GLUCOSEU Negative 04/01/2013 1228   HGBUR NEGATIVE 04/27/2024 2041   BILIRUBINUR NEGATIVE 04/27/2024 2041   BILIRUBINUR Negative 04/01/2013 1228   KETONESUR 5 (A) 04/27/2024 2041   PROTEINUR 30 (A) 04/27/2024 2041   NITRITE NEGATIVE 04/27/2024 2041   LEUKOCYTESUR TRACE (A) 04/27/2024 2041   LEUKOCYTESUR Negative 04/01/2013 1228   Sepsis Labs: @LABRCNTIP (procalcitonin:4,lacticidven:4) Microbiology: No results found for this or any previous visit (from the past 240 hours).    Radiology Studies last 3 days: DG Abd 2 Views Result Date: 05/11/2024 CLINICAL DATA:  Abdominal pain. Status post recent laparotomy with bowel resection. EXAM: DG ABDOMEN 2V COMPARISON:  CT 05/03/2024 FINDINGS: Gaseous small bowel in the left abdomen persist, small bowel distention up to 4.4 cm. Enteric sutures noted in the right abdomen. Small volume formed stool in the colon. Cholecystectomy clips. IVC filter in place. Vascular calcifications. Anterior skin staples. Drains on prior CT are not confidently demonstrated by radiograph. IMPRESSION: Persistent gaseous small bowel distention in the left abdomen, small bowel distention up to 4.4 cm. Favor postoperative ileus, although obstruction is also considered in the appropriate clinical setting. Electronically Signed   By: Andrea Gasman M.D.   On: 05/11/2024 12:03   US  EKG SITE RITE Result Date: 05/08/2024 If Site Rite image not attached, placement  could not be confirmed due to current cardiac rhythm.      Time spent: 50 min     Isahi Godwin, DO [facility] 05/11/2024, 1:51 PM    Dictation software may have been used to generate the above note. Typos may occur and escape review in typed/dictated notes. Please contact Dr Marsa directly for clarity if needed.  Staff may message me via secure chat in Epic  but this may not receive an immediate response,  please page me for urgent matters!  If 7PM-7AM, please contact night coverage [email or web address]

## 2024-05-11 NOTE — Progress Notes (Signed)
 Occupational Therapy Treatment Patient Details Name: Kylie [name] MRN: [id number] DOB: [birth year] Today's Date: 05/11/2024   History of present illness Pt is a 68 y/o F admitted on 04/27/24 after presenting with c/o abdominal pain. Imaging showed ischemic bowel disease. Pt is s/p ex lap & small bowel resection with cecectomy on 04/27/24. PMH: dementia, HTN, HLD, sCHF, CVA, CKD 3A, depression, DM, PE on xarelto , s/p R nephrectomy (2009) 2/2 renal cell carcinoma   OT comments  Pt seen for OT re-assessment/updating goals. Pt is making slow progress towards OT goals. She ambulated within the room using RW with CGA, increased time/slow pace and occasional Min A for RW management around obstacles/during turns. Toileting tasks performed with CGA/SBA for safety with cues for cognition to ensure thoroughness with cleaning after cont BM on toilet and for hand hygiene standing at sink. Pt complaining of leg pain throughout transfers/ambulation and wishing to return to bed. OT educated on importance of time spent OOB and pt willing to sit up in recliner at this time. Pt is weak, high fall risk and limited by cognition. Will cont to benefit from skilled acute OT services to promote safety and IND.      If plan is discharge home, recommend the following:  A lot of help with bathing/dressing/bathroom;Supervision due to cognitive status;A little help with bathing/dressing/bathroom;A little help with walking and/or transfers   Equipment Recommendations  Other (comment) (defer)    Recommendations for Other Services      Precautions / Restrictions Precautions Precautions: Fall Recall of Precautions/Restrictions: Impaired Precaution/Restrictions Comments: log roll for comfort 2/2 abdominal incision Restrictions Weight Bearing Restrictions Per Provider Order: No       Mobility Bed Mobility               General bed mobility comments: sitting on toilet as OT entered with mobility tech     Transfers Overall transfer level: Needs assistance Equipment used: Rolling Dancy (2 wheels) Transfers: Sit to/from Stand Sit to Stand: Contact guard assist, From elevated surface, Supervision           General transfer comment: able to stand from toilet with CGA/SBA for safety, ambulated using RW with slow pace, c/o leg pain throughout with no LOB, heavy UE reliance on RW prior to sitting in recliner     Balance Overall balance assessment: Needs assistance Sitting-balance support: No upper extremity supported, Feet supported Sitting balance-Leahy Scale: Good     Standing balance support: Bilateral upper extremity supported Standing balance-Leahy Scale: Fair Standing balance comment: CGA for safety, RW use                           ADL either performed or assessed with clinical judgement   ADL Overall ADL's : Needs assistance/impaired     Grooming: Wash/dry hands;Standing;Contact guard assist;Cueing for sequencing Grooming Details (indicate cue type and reason): cues for sequencing/task performance                 Toilet Transfer: Minimal assistance;Rolling Arroyo (2 wheels);Regular Teacher, Adult Education Details (indicate cue type and reason): cues for safety and technique Toileting- Clothing Manipulation and Hygiene: Contact guard assist;Sit to/from stand Toileting - Clothing Manipulation Details (indicate cue type and reason): after cont BM on toilet in standing with cues to throw paper in toilet     Functional mobility during ADLs: Minimal assistance;Contact guard assist;Rolling Kalter (2 wheels) General ADL Comments: increased time to perform all tasks with step by step  cues for direction following    Extremity/Trunk Assessment Upper Extremity Assessment Upper Extremity Assessment: Generalized weakness   Lower Extremity Assessment Lower Extremity Assessment: Generalized weakness        Vision       Perception     Praxis      Communication Communication Communication: Impaired Factors Affecting Communication: Reduced clarity of speech   Cognition Arousal: Alert Behavior During Therapy: WFL for tasks assessed/performed                                 Following commands: Impaired Following commands impaired: Follows one step commands with increased time, Follows one step commands inconsistently      Cueing   Cueing Techniques: Verbal cues, Tactile cues  Exercises Other Exercises Other Exercises: Edu on importance of sitting up and getting OOB daily.    Shoulder Instructions       General Comments      Pertinent Vitals/ Pain       Pain Assessment Pain Assessment: 0-10 Faces Pain Scale: Hurts little more Pain Location: leg and stomach Pain Descriptors / Indicators: Grimacing, Moaning Pain Intervention(s): Monitored during session, Repositioned, Limited activity within patient's tolerance  Home Living                                          Prior Functioning/Environment              Frequency  Min 2X/week        Progress Toward Goals  OT Goals(current goals can now be found in the care plan section)  Progress towards OT goals: Progressing toward goals  Acute Rehab OT Goals Patient Stated Goal: get better OT Goal Formulation: With patient Time For Goal Achievement: 05/25/24 Potential to Achieve Goals: Good ADL Goals Pt Will Perform Grooming: with supervision;standing Pt Will Perform Lower Body Dressing: sit to/from stand;sitting/lateral leans;with supervision Pt Will Transfer to Toilet: with supervision;ambulating  Plan      Co-evaluation                 AM-PAC OT 6 Clicks Daily Activity     Outcome Measure   Help from another person eating meals?: A Little Help from another person taking care of personal grooming?: A Little Help from another person toileting, which includes using toliet, bedpan, or urinal?: A Little Help from  another person bathing (including washing, rinsing, drying)?: A Lot Help from another person to put on and taking off regular upper body clothing?: A Little Help from another person to put on and taking off regular lower body clothing?: A Lot 6 Click Score: 16    End of Session Equipment Utilized During Treatment: Rolling Ballinger (2 wheels);Gait belt  OT Visit Diagnosis: Muscle weakness (generalized) (M62.81);Other abnormalities of gait and mobility (R26.89);Pain   Activity Tolerance Patient tolerated treatment well   Patient Left in chair;with call bell/phone within reach;with chair alarm set;with family/visitor present   Nurse Communication Mobility status        Time: 8545-8485 OT Time Calculation (min): 20 min  Charges: OT General Charges $OT Visit: 1 Visit OT Evaluation $OT Re-eval: 1 Re-eval  Thierno Hun, OTR/L  05/11/24, 3:40 PM   Coulson Wehner E Theophile Harvie 05/11/2024, 3:30 PM

## 2024-05-11 NOTE — Progress Notes (Signed)
 PT Cancellation Note  Patient Details Name: Kylie [name] MRN: [id number] DOB: 1956/06/15   Cancelled Treatment:    Pt refused treatment due to abdominal pain. PCP was in the room at the time. Acute care will continue POC.  Signe Shenetta Schnackenberg SPTA    [name] 05/11/2024, 3:48 PM

## 2024-05-12 ENCOUNTER — Other Ambulatory Visit: Payer: Self-pay

## 2024-05-12 DIAGNOSIS — R630 Anorexia: Secondary | ICD-10-CM

## 2024-05-12 DIAGNOSIS — K559 Vascular disorder of intestine, unspecified: Secondary | ICD-10-CM | POA: Diagnosis not present

## 2024-05-12 LAB — CBC
HCT: 23.3 % — ABNORMAL LOW (ref 36.0–46.0)
Hemoglobin: 7.7 g/dL — ABNORMAL LOW (ref 12.0–15.0)
MCH: 32.2 pg (ref 26.0–34.0)
MCHC: 33 g/dL (ref 30.0–36.0)
MCV: 97.5 fL (ref 80.0–100.0)
Platelets: 317 K/uL (ref 150–400)
RBC: 2.39 MIL/uL — ABNORMAL LOW (ref 3.87–5.11)
RDW: 15.1 % (ref 11.5–15.5)
WBC: 10.2 K/uL (ref 4.0–10.5)
nRBC: 0 % (ref 0.0–0.2)

## 2024-05-12 LAB — GLUCOSE, CAPILLARY
Glucose-Capillary: 109 mg/dL — ABNORMAL HIGH (ref 70–99)
Glucose-Capillary: 117 mg/dL — ABNORMAL HIGH (ref 70–99)
Glucose-Capillary: 138 mg/dL — ABNORMAL HIGH (ref 70–99)
Glucose-Capillary: 135 mg/dL — ABNORMAL HIGH (ref 70–99)

## 2024-05-12 LAB — MAGNESIUM: Magnesium: 1.8 mg/dL (ref 1.7–2.4)

## 2024-05-12 MED ORDER — LORAZEPAM 2 MG/ML IJ SOLN
0.5000 mg | INTRAMUSCULAR | Status: DC | PRN
  Administered 2024-05-12: 0.5 mg via INTRAMUSCULAR
  Filled 2024-05-12: qty 1

## 2024-05-12 MED ORDER — OXYCODONE HCL 5 MG PO TABS
5.0000 mg | ORAL_TABLET | ORAL | Status: DC | PRN
  Administered 2024-05-14 – 2024-05-19 (×13): 5 mg via ORAL
  Filled 2024-05-12 (×13): qty 1

## 2024-05-12 MED ORDER — ACETAMINOPHEN 500 MG PO TABS
1000.0000 mg | ORAL_TABLET | Freq: Four times a day (QID) | ORAL | Status: DC
  Administered 2024-05-13 – 2024-05-15 (×9): 1000 mg via ORAL
  Filled 2024-05-12 (×10): qty 2

## 2024-05-12 MED ORDER — LORAZEPAM 2 MG/ML IJ SOLN: 0.5000 mg | INTRAMUSCULAR | Status: DC | PRN

## 2024-05-12 MED ORDER — ZIPRASIDONE MESYLATE 20 MG IM SOLR
10.0000 mg | Freq: Four times a day (QID) | INTRAMUSCULAR | Status: DC | PRN
  Administered 2024-05-12: 10 mg via INTRAMUSCULAR
  Filled 2024-05-12 (×2): qty 20

## 2024-05-12 MED ORDER — LORAZEPAM 0.5 MG PO TABS: 0.5000 mg | ORAL_TABLET | ORAL | Status: DC | PRN

## 2024-05-12 MED ORDER — MAGNESIUM SULFATE 2 GM/50ML IV SOLN
2.0000 g | Freq: Once | INTRAVENOUS | Status: AC
  Administered 2024-05-12: 2 g via INTRAVENOUS
  Filled 2024-05-12: qty 50

## 2024-05-12 MED ORDER — TRACE MINERALS CU-MN-SE-ZN 300-55-60-3000 MCG/ML IV SOLN
INTRAVENOUS | Status: AC
  Filled 2024-05-12: qty 720

## 2024-05-12 NOTE — Progress Notes (Signed)
 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 Complaints of diffuse severe abdominal pain of unknown duration and hypertensive crisis. CTA C/A/P showed closed-loop SBO with developing ischemia. She received Kcentra for DOAC reversal and underwent emergent ex lap on 04/27/2024 with reduction of internal hernia, resection of approximately 110 cm distal small bowel with ileocecectomy, and ileocolic anastomosis. Status post transfusion of 1 unit pRBC on 11/3 for Hgb 6.4. Course complicated by postop ileus as of 11/05. IV Zosyn was stopped on 11/07 after 8 days JP drain removed on 11/08. Had hematochezia x 7 episodes on 11/9-11/10 AM with Hgb drop to nadir of 5.4. Transfused 2 units pRBC. Surgery aware, suspect bleeding at anastomosis site, inaccessible endoscopically. Has been on TPN and recently started on po diet but her intake has been poor.      Consultants:  General Surgery   Procedures/Surgeries: 04/27/24: exploratory laparotomy with small bowel resection, ileocecectomy, ileocolonic anastomosis and reduction of internal hernia       ASSESSMENT & PLAN:   # Ischemic bowel status post ex lap with SB resection plus ileocecectomy and ileocolic anastomosis (10/30) # Course complicated by post-op ileus (11/05) # Persistent/recurrent ileus (KUB 11/13) General surgery following  remains on TPN Poor po intake, had decent intake w/ lunch today, KUB concerning for recurrent ileus, per surgery restart on CLD 11/13 and remains on this into 11/14, consider advancing  tomorrow 11/15 KUB repeat 11/15 Question for feeding tube but given dementia she is a poor candidate for this - I have discussed w/ family that if she is not able to meet her caloric needs will need to discuss further  Miralax  daily Needs staples out but pt was not cooperating with this today, will try again w/ family present    # Acute blood loss anemia - resolved # Hematochezia - resolved  Monitor with serial H/H Continue holding Eliquis   # History of PE s/p IVC filter Eliquis held due to hematochezia and acute blood loss anemia, continue hold d/t anemia and also in case needing further procedures    # Hypertensive urgency - resolved # Primary hypertension - stable  BP stable Continue amlodipine  10 mg daily and clonidine  patch   # AKI on CKD 3a - resolved Monitor BMP   #Chronic 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 Not oriented to situation.    # GOC Has no insight into the severity of her medical condition.  Has no capacity to make her own medical decisions Decision-maker(s): Pt's cousin [name] is usually at bedside - he is her financial POA and pt's brother Koren is her HCPOA. Koren assigns [name] as education officer, environmental if we cannot reach him, he asks we do NOT involve patient's children so I have removed them from her contacts per his request.  goal to wean off TPN and advance diet - if not able to accomplish this such that pt is meeting nutritional requirements, then would need  to talk about alternative plan and likely hospice Not a good candidate for feeding tube  D/w [name] and Koren (by phone in the room) at bedside 11/13 - confirmed for FULL CODE, I did advise that in situations of severe weakness/malnourishment, I strongly recommend against CPR. I do not believe patient is actively dying at this point, my greatest concern is if  she is not improving her po intake then she will certainly continue to decline. Time will tell. Koren and [name] state that when she was lucid years ago she mentioned ~NOT~ wanting to be a DNR so they are going by her wishes, but are open to revisit the conversation if she is getting worse / has terminal illness.     overweight based on BMI: Body mass index is 27.14 kg/m.SABRA Significantly low or high BMI is associated with higher medical risk.  Underweight - under 18  overweight - 25 to 29 obese - 30 or more Class 1 obesity: BMI of 30.0 to 34 Class 2 obesity: BMI of 35.0 to 39 Class 3 obesity: BMI of 40.0 to 49 Super Morbid Obesity: BMI 50-59 Super-super Morbid Obesity: BMI 60+ Healthy nutrition and physical activity advised as adjunct to other disease management and risk reduction treatments    DVT prophylaxis: holding d/t bleed risk IV fluids: no continuous IV fluids  Nutrition: TPN and advancing po as tolerate but intake has been poor as above. CLD for now  Unumprovident / other devices: PICC placed 05/08/24  Code Status: FULL CODE ACP documentation reviewed:  none on file in [facility]  TOC needs: TBD, potential hospice / back to SNF w/ PT/OT if able  Medical barriers to dispo: TPN. Expected medical readiness for discharge pending po intake / GOC.              Subjective / Brief ROS:  Patient has no complaitns. She is not oriented but she is pleasant    Family Communication: Florence Koren 05/12/24 1:54 PM to give update - all questions answered and plan was reviewed     Objective Findings:  Vitals:   05/11/24 1525 05/11/24 2025 05/12/24 0557 05/12/24 0751  BP: 121/68 132/64 130/74 136/70  Pulse: 98 85 88 88  Resp: 16 15 18 17   Temp: 98.7 F (37.1 C) 98.2 F (36.8 C) 98.4 F (36.9 C) 98 F (36.7 C)  TempSrc: Oral  Oral   SpO2: 100% 100% 100% 100%  Weight:   86.2 kg   Height:        Intake/Output Summary (Last 24 hours) at 05/12/2024 1352 Last data filed at  05/12/2024 0600 Gross per 24 hour  Intake 981.83 ml  Output --  Net 981.83 ml   Filed Weights   05/09/24 0406 05/11/24 0510 05/12/24 0557  Weight: 85.8 kg 86.2 kg 86.2 kg    Examination:  Physical Exam Constitutional:      General: She is not in acute distress. Cardiovascular:     Rate and Rhythm: Normal rate and regular rhythm.     Heart sounds: Murmur heard.  Pulmonary:     Effort: Pulmonary effort is normal.     Breath sounds: Normal breath sounds.  Abdominal:     General: Bowel sounds are normal.     Palpations: Abdomen is soft.     Tenderness: There is generalized abdominal tenderness.  Neurological:     Mental Status: She is alert. She is disoriented.  Psychiatric:        Mood and Affect: Mood  normal.        Behavior: Behavior normal.          Scheduled Medications:   sodium chloride    Intravenous Once   sodium chloride    Intravenous Once   acetaminophen   650 mg Oral Q6H   amLODipine   10 mg Oral Daily   Chlorhexidine  Gluconate Cloth  6 each Topical Daily   cloNIDine   0.1 mg Transdermal Weekly   feeding supplement  1 Container Oral TID BM   insulin  aspart  0-6 Units Subcutaneous Q6H   irbesartan  150 mg Oral Daily   levothyroxine   100 mcg Oral q morning   melatonin  5 mg Oral QHS   polyethylene glycol  17 g Oral Daily   sodium chloride  flush  10-40 mL Intracatheter Q12H    Continuous Infusions:  magnesium  sulfate bolus IVPB 2 g (05/12/24 1256)   TPN ADULT (ION) 40 mL/hr at 05/11/24 1722   TPN ADULT (ION)      PRN Medications:  ketorolac , OLANZapine  zydis, mouth rinse, sodium chloride  flush  Antimicrobials from admission:  Anti-infectives (From admission, onward)    Start     Dose/Rate Route Frequency Ordered Stop   04/28/24 0600  piperacillin-tazobactam (ZOSYN) IVPB 3.375 g  Status:  Discontinued        3.375 g 12.5 mL/hr over 240 Minutes Intravenous Every 8 hours 04/28/24 0256 05/05/24 1340   04/27/24 2300  cefoTEtan (CEFOTAN) 2 g in sodium  chloride 0.9 % 100 mL IVPB        2 g 200 mL/hr over 30 Minutes Intravenous On call to O.R. 04/27/24 2255 04/28/24 1319           Data Reviewed:  I have personally reviewed the following...  CBC: Recent Labs  Lab 05/08/24 0638 05/08/24 1001 05/09/24 0536 05/10/24 0453 05/11/24 0553 05/12/24 0336  WBC 14.4*  --  13.8* 13.8* 12.6* 10.2  HGB 7.9* 8.0* 7.5* 7.7* 8.0* 7.7*  HCT 23.9*  --  22.5* 23.0* 24.5* 23.3*  MCV 96.0  --  96.2 96.2 97.2 97.5  PLT 261  --  280 278 302 317   Basic Metabolic Panel: Recent Labs  Lab 05/07/24 0535 05/07/24 0857 05/07/24 1020 05/08/24 0638 05/08/24 0733 05/11/24 0553 05/12/24 0336  NA 137 141  --  145 143 138  --   K 6.8* 4.2  --  3.6 3.7 4.2  --   CL 110 114*  --  116* 114* 108  --   CO2 18* 21*  --  20* 20* 22  --   GLUCOSE 908* 151*  --  90 86 111*  --   BUN 31* 31*  --  31* 30* 19  --   CREATININE 0.87 0.80  --  0.79 0.81 0.73  --   CALCIUM  8.1* 7.9*  --  8.2* 8.1* 8.3*  --   MG 2.3  --   --  2.0  --  1.9 1.8  PHOS 6.1*  --  3.5 3.1  --  3.2  --    GFR: Estimated Creatinine Clearance: 78.1 mL/min (by C-G formula based on SCr of 0.73 mg/dL). Liver Function Tests: Recent Labs  Lab 05/08/24 9361 05/11/24 0553  AST 39 31  ALT 62* 68*  ALKPHOS 80 105  BILITOT 0.7 0.2  PROT 5.4* 5.5*  ALBUMIN 2.2* 2.9*   No results for input(s): LIPASE, AMYLASE in the last 168 hours. No results for input(s): AMMONIA in the last 168 hours. Coagulation Profile: Recent Labs  Lab 05/07/24 0855  INR 1.1   Cardiac Enzymes: No results for input(s): CKTOTAL, CKMB, CKMBINDEX, TROPONINI in the last 168 hours. BNP (last 3 results) No results for input(s): PROBNP in the last 8760 hours. HbA1C: No results for input(s): HGBA1C in the last 72 hours. CBG: Recent Labs  Lab 05/11/24 1130 05/11/24 1637 05/12/24 0110 05/12/24 0556 05/12/24 1133  GLUCAP 152* 117* 109* 117* 138*   Lipid Profile: No results for input(s):  CHOL, HDL, LDLCALC, TRIG, CHOLHDL, LDLDIRECT in the last 72 hours.  Thyroid Function Tests: No results for input(s): TSH, T4TOTAL, FREET4, T3FREE, THYROIDAB in the last 72 hours. Anemia Panel: No results for input(s): VITAMINB12, FOLATE, FERRITIN, TIBC, IRON, RETICCTPCT in the last 72 hours. Most Recent Urinalysis On File:     Component Value Date/Time   COLORURINE STRAW (A) 04/27/2024 2041   APPEARANCEUR CLEAR (A) 04/27/2024 2041   APPEARANCEUR Clear 04/01/2013 1228   LABSPEC 1.017 04/27/2024 2041   LABSPEC 1.014 04/01/2013 1228   PHURINE 6.0 04/27/2024 2041   GLUCOSEU >=500 (A) 04/27/2024 2041   GLUCOSEU Negative 04/01/2013 1228   HGBUR NEGATIVE 04/27/2024 2041   BILIRUBINUR NEGATIVE 04/27/2024 2041   BILIRUBINUR Negative 04/01/2013 1228   KETONESUR 5 (A) 04/27/2024 2041   PROTEINUR 30 (A) 04/27/2024 2041   NITRITE NEGATIVE 04/27/2024 2041   LEUKOCYTESUR TRACE (A) 04/27/2024 2041   LEUKOCYTESUR Negative 04/01/2013 1228   Sepsis Labs: @LABRCNTIP (procalcitonin:4,lacticidven:4) Microbiology: No results found for this or any previous visit (from the past 240 hours).    Radiology Studies last 3 days: DG Abd 2 Views Result Date: 05/11/2024 CLINICAL DATA:  Abdominal pain. Status post recent laparotomy with bowel resection. EXAM: DG ABDOMEN 2V COMPARISON:  CT 05/03/2024 FINDINGS: Gaseous small bowel in the left abdomen persist, small bowel distention up to 4.4 cm. Enteric sutures noted in the right abdomen. Small volume formed stool in the colon. Cholecystectomy clips. IVC filter in place. Vascular calcifications. Anterior skin staples. Drains on prior CT are not confidently demonstrated by radiograph. IMPRESSION: Persistent gaseous small bowel distention in the left abdomen, small bowel distention up to 4.4 cm. Favor postoperative ileus, although obstruction is also considered in the appropriate clinical setting. Electronically Signed   By: Andrea Gasman M.D.   On: 05/11/2024 12:03   US  EKG SITE RITE Result Date: 05/08/2024 If Site Rite image not attached, placement could not be confirmed due to current cardiac rhythm.      Time spent: 50 min     Mcdaniel Ohms, DO [facility] 05/12/2024, 1:52 PM    Dictation software may have been used to generate the above note. Typos may occur and escape review in typed/dictated notes. Please contact Dr Marsa directly for clarity if needed.  Staff may message me via secure chat in Epic  but this may not receive an immediate response,  please page me for urgent matters!  If 7PM-7AM, please contact night coverage [email or web address]

## 2024-05-12 NOTE — Progress Notes (Signed)
 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 She presented on 04/27/2024 from her long-term care facility [facility]) with severe diffuse abdominal pain of unclear duration and hypertensive crisis. CTA chest/abdomen/pelvis revealed a closed-loop small bowel obstruction with evolving ischemia. She received Kcentra for DOAC reversal and underwent emergent exploratory laparotomy on 04/27/2024, which included reduction of an internal hernia, resection of approximately 110 cm of distal small bowel with ileocecectomy, and ileocolic anastomosis.  KUB dated 11/13 consistent with persistent/recurrent ileus. She is being followed by GI team. She is currently on clear liquid diet and plans to advance diet as tolerated on 05/13/2024. Plan do do another KUB on 05/13/2024.   Today's Discussion 05/12/2024   Chart reviewed inclusive of vital signs, progress notes, laboratory results, and diagnostic images. KUB result reviewed from 05/11/2024, favors post-operative ileus, will repeat KUB on 11/15.  Patient was seen sitting comfortably in a recliner, in no apparent distress. She is pleasantly confused. Reports awareness of an abdominal incision, describing mild, tolerable aching pain at the site (3/10). Intermittent confusion noted, at one point she believed she was at home and was unable to correctly answer orientation questions. However, she expressed feeling "a lot better than previous days" and stated, "I do what they told me to do to get better." Her appetite remains sub optimal, noted only 10% of her lunch meal was eaten yesterday and today she is on clear liquid diet. She spoke positively about her cousin and mentioned that  everyone she interacts with has been nice. Noted for ongoing continuous TPN at 40 ml /hr.   Received bedside report from RN, patient ambulated within her room and tolerated it well. RN also reported a bowel movement described as "sausage-like." No other significant findings.  Attempted to contact patient's brother, call was answered but disconnected shortly after, and subsequent attempts were unsuccessful. Successfully reached cousin [name], who expressed appreciation for the call. Reviewed goals of care for Miss Teya, he confirmed that goals remain unchanged, with continued efforts toward medical stabilization. [name] noted that the patient appears much improved compared to previous days.  Discussed potential addition of an appetite stimulant to improve oral intake,  [name] agreed with this plan. Also addressed the possibility of PEG tube placement if caloric needs cannot be met orally. Explained that due to advanced dementia, PEG or gastric tube placement is generally not recommended because of poor candidacy and risk of complications. [name] agreed to proceed conservatively, stating, "Let's take it one day at a time."  I confirmed availability for follow-up over the next two days and encouraged him to reach out with any questions. Conversation concluded with [name] expressing gratitude for the care provided.  Created space and opportunity for patient to explore thoughts feelings and fears regarding current medical situation.  Patient and her family face treatment option decisions, advanced directive decisions and anticipatory care needs.   Questions and concerns addressed.  Palliative Support Provided.   Objective Assessment: Vital Signs Vitals:   05/12/24 0557 05/12/24 0751  BP: 130/74 136/70  Pulse: 88 88  Resp: 18 17  Temp: 98.4 F (36.9 C) 98 F (36.7 C)  SpO2: 100% 100%    Intake/Output Summary (Last 24 hours) at 05/12/2024 1539 Last data filed at 05/12/2024 0600  Gross per 24  hour  Intake 981.83 ml  Output --  Net 981.83 ml   Last Weight  Most recent update: 05/12/2024  6:54 AM    Weight  86.2 kg (190 lb 0.6 oz)             Physical Exam Constitutional:      General: Frail-looking, she is not in acute distress. Cardiovascular:     Rate and Rhythm: Normal rate and regular rhythm.  Pulmonary:     Effort: Pulmonary effort is normal.     Breath sounds: Normal breath sounds.  Abdominal:     General: Bowel sounds are normal.     Palpations: Abdomen is soft.  Neurological:     Mental Status: She is alert. She is disoriented.  Psychiatric:        Mood and Affect: Mood normal.       SUMMARY OF RECOMMENDATIONS   Code Status: Maintain Full Code Goal of care is medical stabilization and recovery to the extent this is possible Continue with full scope of care per patient/family's wish/desire. Consider adding an appetite stimulant to help meet caloric requirements when diet is advanced, possibly tomorrow, 05/13/2024.  Spiritual care consult Continue to provide psycho-social and emotional support to patient and family Palliative medicine team will continue to follow.   Symptom Management: Per Primary team Palliative medicine is available to assist as needed.    Time Spent: 35 minutes  Detailed review of medical records including labs, imaging and vital signs,  medically appropriate exam, discussed with treatment team, counseling and education to patient, family, & staff, documenting clinical information, coordination of care.   ______________________________________________________________________________________ Kathlyne Bolder NP-C [place] Palliative Medicine Team Team Cell Phone: [phone] Please utilize secure chat with additional questions, if there is no response within 30 minutes please call the above phone number  Palliative Medicine Team providers are available by phone from 7am to 7pm daily and can be reached through the team cell  phone.  Should this patient require assistance outside of these hours, please call the patient's attending physician.

## 2024-05-12 NOTE — Progress Notes (Addendum)
 IV team came in to place PICC but pt is resisting. Pt is swinging and kicking at staff. Pt has a TPN continuous ordered. MD Mansy made aware.  Update 1956: See new order. RN staff requested sitter case for pt, CN and AC made aware. Per [facility] no available staff to do the  sitter case at this time.  Updated: Geodon IM administered. IV team made aware.   Update 2126: Pt still swinging at IV team and staff. MD Mansy made aware.  Update 2133: See new order.  Update 2240: Sitter case placed.

## 2024-05-12 NOTE — Progress Notes (Signed)
 Nutrition Follow-up  DOCUMENTATION CODES:   Non-severe (moderate) malnutrition in context of chronic illness  INTERVENTION:   -Continue TPN management per pharmacy (increase to full rate on 05/12/24) -Monitor Mg, K, and Phos and replete as needed secondary to high refeeding risk -Continue daily weights -Continue clear liquid diet per general surgery; RD will follow for diet advancement and adjust supplement regimen as appropriate  -Continue feeding assistance with meals  -Continue Boost Breeze po TID, each supplement provides 250 kcal and 9 grams of protein  -If PEG is desired, recommend:    Initiate Osmolite 1.5 @ 20 ml/hr and increase by 10 ml every 12 hours to goal rate of 50 ml/hr.    60 ml Prosource TF20 BID   Tube feeding regimen provides 1960 kcal (100% of needs), 115 grams of protein, and 914 ml of H2O.     NUTRITION DIAGNOSIS:   Moderate Malnutrition related to chronic illness as evidenced by moderate fat depletion, moderate muscle depletion.  Ongoing  GOAL:   Patient will meet greater than or equal to 90% of their needs  Met with TPN  MONITOR:   Diet advancement, Labs, Weight trends, Skin, I & O's, Other (Comment) (TPN)  REASON FOR ASSESSMENT:   Consult New TPN/TNA  ASSESSMENT:   68 y.o. female with h/o dementia, HTN, HLD, CHF, CVA, CKD-3a, depression, DM, PE on Xarelto , RCC s/p right nephrectomy (2009), hypothryoidism, CAD and OSA who is admitted with SBO secondary to Internal hernia resulting in small bowel ischemia and necrosis now s/p exploratory laparotomy, reduction of internal hernia, small bowel resection with cecectomy (~110cm ileum along with IC valve) and ileocolic anastomosis 10/31.  10/31- s/p s/p exploratory laparotomy with small bowel resection, ileocecectomy, ileocolonic anastomosis and reduction of internal hernia, PICC placed, TPN initiated 11/1- NGT removed by patient, pulled out PICC line 11/2- PICC line replaced 11/3- IV thiamine  ordered 11/6- advanced to clear liquid diet 11/7- advanced to full liquid diet, penrose drain removed 11/11- advanced to soft diet, TPN at half rate  11/13- downgraded to clear liquid diet, calorie count completed (meeting 4% of needs) 11/14- TPN advanced to full rate  Reviewed I/O's: +1.2 L x 24 hours and +13.6 L since 04/28/24  Per general surgery notes, diet was downgraded to clear liquids yesterday due to concern for ileus (KUB still showing dilated loops of small bowel on 05/11/24). Plan to repeat KUB on 05/13/24 and continue bowel regimen. Per surgery, may advance diet tomorrow if KUB is improved. Staples will be removed today.   Patient sleeping soundly at time of visit. She did awake to voice or touch. No family at bedside. She consumed 100% of Boost Breeze supplement.   Patient continues on TPN at 40 ml/hr, which provides 1069 kcals and 58 grams protein, meeting 53% of estimated kcal needs and 58% of estimated protein needs. Plan to increase to full rate today at 1800 (75 ml/hr, which provides 2005 kcals and 108 grams protein, which provides 100% of estimated kcal and protein needs).   Palliative care following for goals of care discussions. Unless TPN is continued of PEG is pursued, there is nothing further that RD can offer from a nutritional standpoint. RD acknowledges that neither TPN nor TF would be a great option, as TPN would complicate hospital discharge (patient resides in a SNF) and RD would be hesitant to recommend PEG due to history of dementia.   Weight has been stable over the past week.   Medications reviewed and include melatonin and  miralax .   Labs reviewed: K, Mg, and Phos. CBGS: 109-152 (inpatient orders for glycemic control are 0-6 units insulin  aspart every 6 hours).    Diet Order:   Diet Order             Diet clear liquid Room service appropriate? Yes; Fluid consistency: Thin  Diet effective now                   EDUCATION NEEDS:   Not appropriate  for education at this time  Skin:  Skin Assessment: Skin Integrity Issues: Skin Integrity Issues:: Incisions Incisions: closed abdomen  Last BM:  05/12/24 (type 4)  Height:   Ht Readings from Last 1 Encounters:  04/28/24 5' 10 (1.778 m)    Weight:   Wt Readings from Last 1 Encounters:  05/12/24 86.2 kg    Ideal Body Weight:  68.1 kg  BMI:  Body mass index is 27.27 kg/m.  Estimated Nutritional Needs:   Kcal:  2000-2300kcal/day  Protein:  100-115g/day  Fluid:  1.8-2.1L/day    Margery ORN, RD, LDN, CDCES Registered Dietitian III Certified Diabetes Care and Education Specialist If unable to reach this RD, please use RD Inpatient group chat on secure chat between hours of 8am-4 pm daily

## 2024-05-12 NOTE — Progress Notes (Addendum)
 05/12/2024  Subjective: Patient is 15 Days Post-Op.  Patient reports continued abdominal pain at the midline.  Denies BM although there is one recorded in her chart in the afternoon yesterday.  She does report having flatus. Due to KUB still showing dilated loops of small bowel yesterday, her diet was regressed to clear liquids. WBC normalized today, down to 10.2, and Hgb stable at 7.7.    Vital signs: Temp:  [98 F (36.7 C)-98.7 F (37.1 C)] 98 F (36.7 C) (11/14 0751) Pulse Rate:  [84-98] 88 (11/14 0751) Resp:  [15-19] 17 (11/14 0751) BP: (121-136)/(64-74) 136/70 (11/14 0751) SpO2:  [100 %] 100 % (11/14 0751) Weight:  [86.2 kg] 86.2 kg (11/14 0557)   Intake/Output: 11/13 0701 - 11/14 0700 In: 982 [P.O.:600; I.V.:382] Out: -  Last BM Date : 05/11/24  Physical Exam: Constitutional:  No acute distress Abdomen:  soft, non-distended, with localized tenderness in midline.  The incision itself is intact, healing well, without any erythema or induration.  Staples in place.    Labs:  Recent Labs    05/11/24 0553 05/12/24 0336  WBC 12.6* 10.2  HGB 8.0* 7.7*  HCT 24.5* 23.3*  PLT 302 317   Recent Labs    05/11/24 0553  NA 138  K 4.2  CL 108  CO2 22  GLUCOSE 111*  BUN 19  CREATININE 0.73  CALCIUM  8.3*   No results for input(s): LABPROT, INR in the last 72 hours.  Imaging: DG Abd 2 Views Result Date: 05/11/2024 CLINICAL DATA:  Abdominal pain. Status post recent laparotomy with bowel resection. EXAM: DG ABDOMEN 2V COMPARISON:  CT 05/03/2024 FINDINGS: Gaseous small bowel in the left abdomen persist, small bowel distention up to 4.4 cm. Enteric sutures noted in the right abdomen. Small volume formed stool in the colon. Cholecystectomy clips. IVC filter in place. Vascular calcifications. Anterior skin staples. Drains on prior CT are not confidently demonstrated by radiograph. IMPRESSION: Persistent gaseous small bowel distention in the left abdomen, small bowel distention up  to 4.4 cm. Favor postoperative ileus, although obstruction is also considered in the appropriate clinical setting. Electronically Signed   By: Andrea Gasman M.D.   On: 05/11/2024 12:03    Assessment/Plan: This is a 68 y.o. female s/p exlap, small bowel resection, cecectomy, with ileocolonic anastomosis.   --Patient had BM yesterday, but her KUB was still showing dilated loops of small bowel consistent with ileus.  Would recommend continuing with clear liquids today and if her bowel function continues to improve, could start advancing her diet again tomorrow.   --Will order repeat KUB tomorrow to further evaluate. --Continue bowel regimen with miralax  daily. --Would do full rate TPN today. --Will remove midline incision staples today.   Aloysius [name], MD [facility]

## 2024-05-12 NOTE — Progress Notes (Signed)
 Arrived to place PICC for TPN. When checking name band, patient suddenly retracted her arm and refused. Even after explanation patient still was resisting any intervention. Ceola Beals RN notified.

## 2024-05-12 NOTE — Progress Notes (Signed)
 Physical Therapy Treatment Patient Details Name: Kylie [name] MRN: [id number] DOB: [birth year] Today's Date: 05/12/2024   History of Present Illness Pt is a 68 y/o F admitted on 04/27/24 after presenting with c/o abdominal pain. Imaging showed ischemic bowel disease. Pt is s/p ex lap & small bowel resection with cecectomy on 04/27/24. PMH: dementia, HTN, HLD, sCHF, CVA, CKD 3A, depression, DM, PE on xarelto , s/p R nephrectomy (2009) 2/2 renal cell carcinoma    PT Comments  Pt was alert however only truly oriented to self. Pleasantly confused. Did not want to participate however with a little encouragement did agree. She was seated in recliner pre/post session. Does c/o severe stomach pain throughout session. RN gave pain medication at conclusion.  Pt was able to stand and tolerate ambulation with RW a short distance. Gait distance limited by pain and fatigue but overall no LOB. She will continue to benefit from skilled PT at DC to maximize independence and safety with all ADLs. DC recs remain.    If plan is discharge home, recommend the following: A little help with walking and/or transfers;A little help with bathing/dressing/bathroom     Equipment Recommendations  Rolling Lunn (2 wheels)       Precautions / Restrictions Precautions Precautions: Fall Recall of Precautions/Restrictions: Impaired Precaution/Restrictions Comments: log roll for comfort 2/2 abdominal incision Restrictions Weight Bearing Restrictions Per Provider Order: No     Mobility  Bed Mobility Overal bed mobility: Needs Assistance Bed Mobility: Supine to Sit, Sit to Supine  Supine to sit: Supervision Sit to supine: Supervision   Transfers Overall transfer level: Needs assistance Equipment used: Rolling Hegarty (2 wheels) Transfers: Sit to/from Stand Sit to Stand: From elevated surface, Contact guard assist  General transfer comment: CGA for tactle cueing    Ambulation/Gait Ambulation/Gait assistance:  Contact guard assist Gait Distance (Feet): 35 Feet Assistive device: Rolling Lafever (2 wheels) Gait Pattern/deviations: Step-through pattern, Trunk flexed Gait velocity: decreased  General Gait Details: Pt ambulated ~ 35 ft with RW + extremely slow cautious gait cadence.     Balance Overall balance assessment: Needs assistance Sitting-balance support: No upper extremity supported, Feet supported Sitting balance-Leahy Scale: Good     Standing balance support: Bilateral upper extremity supported Standing balance-Leahy Scale: Fair         Hotel Manager: No apparent difficulties  Cognition Arousal: Alert Behavior During Therapy: WFL for tasks assessed/performed   PT - Cognitive impairments: History of cognitive impairments      PT - Cognition Comments: Pt is alert and able to follow commands however is disoriented x 3. Only truely oriuented to self. Pleasantly confused Following commands: Intact      Cueing Cueing Techniques: Verbal cues, Tactile cues         Pertinent Vitals/Pain Pain Assessment Pain Assessment: 0-10 Pain Score: 8  Pain Location: stomach Pain Descriptors / Indicators: Grimacing, Moaning Pain Intervention(s): Limited activity within patient's tolerance, Monitored during session, Repositioned, Patient requesting pain meds-RN notified     PT Goals (current goals can now be found in the care plan section) Acute Rehab PT Goals Patient Stated Goal: decreased pain Progress towards PT goals: Progressing toward goals    Frequency    Min 2X/week       Co-evaluation     PT goals addressed during session: Mobility/safety with mobility;Balance;Proper use of DME;Strengthening/ROM        AM-PAC PT 6 Clicks Mobility   Outcome Measure  Help needed turning from your back to your side while in  a flat bed without using bedrails?: A Little Help needed moving from lying on your back to sitting on the side of a flat bed  without using bedrails?: A Little Help needed moving to and from a bed to a chair (including a wheelchair)?: A Little Help needed standing up from a chair using your arms (e.g., wheelchair or bedside chair)?: A Little Help needed to walk in hospital room?: A Little Help needed climbing 3-5 steps with a railing? : A Lot 6 Click Score: 17    End of Session Equipment Utilized During Treatment: Gait belt Activity Tolerance: Patient tolerated treatment well Patient left: in bed;with call bell/phone within reach;with chair alarm set Nurse Communication: Mobility status PT Visit Diagnosis: Muscle weakness (generalized) (M62.81);Unsteadiness on feet (R26.81);Pain     Time: 8965-8953 PT Time Calculation (min) (ACUTE ONLY): 12 min  Charges:    $Gait Training: 8-22 mins PT General Charges $$ ACUTE PT VISIT: 1 Visit                     Rankin Essex PTA 05/12/24, 10:57 AM

## 2024-05-12 NOTE — Progress Notes (Signed)
 05/12/24  Went to patient's room to remove her midline incision staples.  When explaining to the patient what I needed to do, she refused to have anything done.  Explained to the patient that this would not hurt and no cuts are needed, but she still refused.  She would try to push my arm out of the way and also pull her sheet/blanket up.    Staples remain in place.  These need to be removed at some point to prevent skin overgrowth over the staples.   Aloysius Plant, MD

## 2024-05-12 NOTE — Plan of Care (Signed)

## 2024-05-12 NOTE — Progress Notes (Addendum)
 Peripherally Inserted Central Catheter Placement  The IV Nurse has discussed with the patient and/or persons authorized to consent for the patient, the purpose of this procedure and the potential benefits and risks involved with this procedure.  The benefits include less needle sticks, lab draws from the catheter, and the patient may be discharged home with the catheter. Risks include, but not limited to, infection, bleeding, blood clot (thrombus formation), and puncture of an artery; nerve damage and irregular heartbeat and possibility to perform a PICC exchange if needed/ordered by physician.  Alternatives to this procedure were also discussed.  Bard Power PICC patient education guide, fact sheet on infection prevention and patient information card has been provided to patient /or left at bedside. Insertion required 2 person assist to prevent disruption of sterile field.    PICC Placement Documentation  PICC Double Lumen 05/12/24 Left Brachial 40 cm 0 cm (Active)  Indication for Insertion or Continuance of Line Administration of hyperosmolar/irritating solutions (i.e. TPN, Vancomycin, etc.) 05/12/24 2225  Exposed Catheter (cm) 0 cm 05/12/24 2225  Site Assessment Clean, Dry, Intact 05/12/24 2225  Lumen #1 Status Blood return noted;Flushed;Saline locked 05/12/24 2225  Lumen #2 Status Blood return noted;Flushed;Saline locked 05/12/24 2225  Dressing Type Transparent;Securing device 05/12/24 2225  Dressing Status Antimicrobial disc/dressing in place 05/12/24 2225  Line Care Connections checked and tightened 05/12/24 2225  Line Adjustment (NICU/IV Team Only) No 05/12/24 2225  Dressing Intervention New dressing;Adhesive placed at insertion site (IV team only) 05/12/24 2225  Dressing Change Due 05/19/24 05/12/24 2225       Debby Salomon CROME 05/12/2024, 10:40 PM

## 2024-05-12 NOTE — Plan of Care (Signed)
  Problem: Coping: Goal: Ability to adjust to condition or change in health will improve Outcome: Progressing   Problem: Coping: Goal: Level of anxiety will decrease Outcome: Progressing   Problem: Safety: Goal: Ability to remain free from injury will improve Outcome: Progressing

## 2024-05-12 NOTE — Progress Notes (Signed)
 PHARMACY - TOTAL PARENTERAL NUTRITION CONSULT NOTE   Indication: Small bowel obstruction  Patient Measurements: Height: 5' 10 (177.8 cm) Weight: 86.2 kg (190 lb 0.6 oz) IBW/kg (Calculated) : 68.5 TPN AdjBW (KG): 79.6 Body mass index is 27.27 kg/m. Usual Weight: 75.8 kg (02/07/24)  Assessment:  Kylie [name] is a 68yoF that presented with upper abdominal pain. Patient's past medical history notable for dementia, HTN, HLD, sCHF with EF 40-45% (10/08/23), CVA, CKD-3a, depression, DM, PE on apixaban, and s/p right nephrectomy in 2009 for right renal cell carcinoma.  Glucose / Insulin : BG 111-152. (1u SSI in last 24 hours)  Electrolytes: K 4.2, Mag 1.8, Phos 3.2, Corrected Ca 9.2 Renal: Scr 0.80 > 0.79 > 0.79 Hepatic: Alk phos 105, AST 31, ALT 68, total bili 0.2, albumin 2.2 Intake / Output; MIVF: Net since admission: (+) 13.6L GI Imaging: 10/30 CT Angio Impression: Closed loop small bowel obstruction likely secondary to an internal hernia with findings concerning for developing bowel ischemia. Clinical correlation and surgical consult is advised. Status post right nephrectomy. 11/5 CTABD: Dilated small-bowel loops up to 4.3 cm without focal obstruction, consistent with ileus. Small amount of free fluid. 11/13 Abd XR: Persistent small bowel distention suggestive of continued post-op ileus. GI Surgeries / Procedures:  10/31: Exploratory Laparotomy, reduction of internal hernia, small bowel resection with cecectomy (length 110 cm), ileocolic anastomosis  ID Zosyn 10/31 >> 11/7  Central access: yes TPN start date: 04/28/2024  Nutritional Goals: Goal TPN rate is 75 mL/hr (provides 108 g of protein and 2005 kcals per day)  RD Assessment: Estimated Needs Total Energy Estimated Needs: 2000-2300kcal/day Total Protein Estimated Needs: 100-115g/day Total Fluid Estimated Needs: 1.8-2.1L/day  Current Nutrition:  Full liquid 11/7 >> soft foods 11/11 >> clear liquids 11/13  Plan:  Per  surgery, patient having bowel function but not eating much Advance TPN to 75 ml/hr (goal rate) at 1800 Electrolytes in TPN (standard): Na 50mEq/L, K 50mEq/L, Ca 86mEq/L, Mg 42mEq/L, and Phos 15mmol/L Cl:Ac 1:1 (watch) Add standard MVI and trace elements to TPN Give magnesium  sulfate 2 g IV x1 today Thiamine 100mg  IV daily x 7 days per dietician (completed) Continue Sensitive  q6h SSI and adjust as needed  Monitor TPN labs on Mon/Thurs - check Mag in AM  Thank you for involving pharmacy in this patient's care.   Damien Napoleon, PharmD Clinical Pharmacist 05/12/2024 9:35 AM

## 2024-05-13 ENCOUNTER — Inpatient Hospital Stay

## 2024-05-13 DIAGNOSIS — K559 Vascular disorder of intestine, unspecified: Secondary | ICD-10-CM | POA: Diagnosis not present

## 2024-05-13 LAB — GLUCOSE, CAPILLARY
Glucose-Capillary: 112 mg/dL — ABNORMAL HIGH (ref 70–99)
Glucose-Capillary: 125 mg/dL — ABNORMAL HIGH (ref 70–99)
Glucose-Capillary: 129 mg/dL — ABNORMAL HIGH (ref 70–99)
Glucose-Capillary: 136 mg/dL — ABNORMAL HIGH (ref 70–99)

## 2024-05-13 LAB — CBC
HCT: 24.8 % — ABNORMAL LOW (ref 36.0–46.0)
Hemoglobin: 8 g/dL — ABNORMAL LOW (ref 12.0–15.0)
MCH: 32.1 pg (ref 26.0–34.0)
MCHC: 32.3 g/dL (ref 30.0–36.0)
MCV: 99.6 fL (ref 80.0–100.0)
Platelets: 354 K/uL (ref 150–400)
RBC: 2.49 MIL/uL — ABNORMAL LOW (ref 3.87–5.11)
RDW: 15.6 % — ABNORMAL HIGH (ref 11.5–15.5)
WBC: 10.8 K/uL — ABNORMAL HIGH (ref 4.0–10.5)
nRBC: 0 % (ref 0.0–0.2)

## 2024-05-13 LAB — BASIC METABOLIC PANEL WITH GFR
Anion gap: 6 (ref 5–15)
BUN: 22 mg/dL (ref 8–23)
CO2: 22 mmol/L (ref 22–32)
Calcium: 8.3 mg/dL — ABNORMAL LOW (ref 8.9–10.3)
Chloride: 113 mmol/L — ABNORMAL HIGH (ref 98–111)
Creatinine, Ser: 0.74 mg/dL (ref 0.44–1.00)
GFR, Estimated: >60 mL/min (ref >60)
Glucose, Bld: 118 mg/dL — ABNORMAL HIGH (ref 70–99)
Potassium: 4.4 mmol/L (ref 3.5–5.1)
Sodium: 140 mmol/L (ref 135–145)

## 2024-05-13 MED ORDER — LORAZEPAM 2 MG/ML IJ SOLN
0.5000 mg | INTRAMUSCULAR | Status: DC | PRN
  Administered 2024-05-15 – 2024-05-18 (×3): 0.5 mg via INTRAVENOUS
  Filled 2024-05-13 (×4): qty 1

## 2024-05-13 MED ORDER — TRACE MINERALS CU-MN-SE-ZN 300-55-60-3000 MCG/ML IV SOLN
INTRAVENOUS | Status: AC
  Filled 2024-05-13: qty 720

## 2024-05-13 NOTE — Progress Notes (Signed)
 PHARMACY - TOTAL PARENTERAL NUTRITION CONSULT NOTE   Indication: Small bowel obstruction  Patient Measurements: Height: 5' 10 (177.8 cm) Weight: 81.5 kg (179 lb 10.8 oz) (SCD machine was removed from bed to get pts more accurate weight) IBW/kg (Calculated) : 68.5 TPN AdjBW (KG): 79.6 Body mass index is 25.78 kg/m. Usual Weight: 75.8 kg (02/07/24)  Assessment:  Kylie [name] is a 68yoF that presented with upper abdominal pain. Patient's past medical history notable for dementia, HTN, HLD, sCHF with EF 40-45% (10/08/23), CVA, CKD-3a, depression, DM, PE on apixaban, and s/p right nephrectomy in 2009 for right renal cell carcinoma.  Glucose / Insulin : BG 111-152. (1u SSI in last 24 hours)  Electrolytes: K 4.2, Mag 1.8, Phos 3.2, Corrected Ca 9.2 Renal: Scr 0.80 > 0.79 > 0.79 Hepatic: Alk phos 105, AST 31, ALT 68, total bili 0.2, albumin 2.2 Intake / Output; MIVF: Net since admission: (+) 13.6L GI Imaging: 10/30 CT Angio Impression: Closed loop small bowel obstruction likely secondary to an internal hernia with findings concerning for developing bowel ischemia. Clinical correlation and surgical consult is advised. Status post right nephrectomy. 11/5 CTABD: Dilated small-bowel loops up to 4.3 cm without focal obstruction, consistent with ileus. Small amount of free fluid. 11/13 Abd XR: Persistent small bowel distention suggestive of continued post-op ileus. GI Surgeries / Procedures:  10/31: Exploratory Laparotomy, reduction of internal hernia, small bowel resection with cecectomy (length 110 cm), ileocolic anastomosis  ID Zosyn 10/31 >> 11/7  Central access: yes TPN start date: 04/28/2024  Nutritional Goals: Goal TPN rate is 75 mL/hr (provides 108 g of protein and 2005 kcals per day)  RD Assessment: Estimated Needs Total Energy Estimated Needs: 2000-2300kcal/day Total Protein Estimated Needs: 100-115g/day Total Fluid Estimated Needs: 1.8-2.1L/day  Current Nutrition:  Full  liquid 11/7 >> soft foods 11/11 >> clear liquids 11/13  Plan:  Per surgery, patient having bowel function but not eating much Continue TPN to 75 ml/hr (goal rate) at 1800 Electrolytes in TPN (standard): Na 50mEq/L, K 50mEq/L, Ca 83mEq/L, Mg 18mEq/L, and Phos 15mmol/L Cl:Ac 1:1 (watch) Add standard MVI and trace elements to TPN Give magnesium  sulfate 2 g IV x1 today Thiamine 100mg  IV daily x 7 days per dietician (completed) Continue Sensitive  q6h SSI and adjust as needed  Monitor TPN labs on Mon/Thurs - check Mag in AM  Thank you for involving pharmacy in this patient's care.   Siddiq Kaluzny A Briawna Carver, PharmD Clinical Pharmacist 05/13/2024 11:15 AM

## 2024-05-13 NOTE — Progress Notes (Signed)
 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 She presented on 04/27/2024 from her long-term care facility [facility] with severe diffuse abdominal pain of unclear duration and hypertensive crisis. CTA chest/abdomen/pelvis revealed a closed-loop small bowel obstruction with evolving ischemia. She received Kcentra for DOAC reversal and underwent emergent exploratory laparotomy on 04/27/2024, which included reduction of an internal hernia, resection of approximately 110 cm of distal small bowel with ileocecectomy, and ileocolic anastomosis.   Today's Discussion 05/13/2024   Chart reviewed inclusive of vital signs, progress notes, laboratory results, and diagnostic images. KUB result reviewed from 05/13/2024, still favors persistent ileus,  no significant improvement when compared to previous imaging.   Visited patient at bedside today. Noted patient to be sleeping comfortably. Not in any form of acute distress. She is easily awakened. She denies any pain or discomfort. She is tolerating  liquid diet. She denies passing gas. No nausea or vomiting. She is pleasantly confused. Unable to accurately answer orientation questions due to underlying dementia. Noted to have safety sitter at bedside. Unable to accurately answer orientation questions due to underlying dementia.   I received a report from the charge nurse. Patient remains confused. They noted that patient had pulled the PICC line three times now, and that's the reason for the safety sitter to be in place. No significant event happened overnight.  I reviewed patient's abdominal X-ray and surgery note. It appears that X-ray is still consistent with persistent  ileus. No significant improvement from prior X-ray result. The plan is for her to continue on clear liquid diet and to continue with TPN at full rate for now.   Shortly after, I reached out to patient's cousin [name] telephonically to revisit goals of care and to update current medical state of the patient. I shared with [name] that patient's most recent abdominal X-ray is still showing persistent ileus. Therefore, patient is still on clear liquid diet. I mentioned to him that we will plan to start patient on appetite stimulant whenever we can advance her diet. I shared with [name] that patient continues to be confused and that she has already pulled out three PICC lines due to her confusion. [name] mentioned that the goals of care remain unchanged. They desire for the medical team to continue with current medical treatment, allow time for outcomes, and hope for clinical improvement.  Created space and opportunity for patient to explore thoughts feelings and fears regarding current medical situation.  Patient and her family face treatment option decisions, advanced directive decisions and anticipatory care needs.   Questions and concerns addressed.  Palliative Support Provided.   Objective Assessment: Vital Signs Vitals:   05/13/24 0406 05/13/24 0759  BP: (!) 118/57 125/69  Pulse: 86 82  Resp: 17 17  Temp: 99.5 F (37.5 C)   SpO2: 100% 100%    Intake/Output Summary (Last 24 hours) at 05/13/2024 1334 Last data filed at 05/13/2024 0653 Gross per 24 hour  Intake 634.17 ml  Output --  Net 634.17 ml   Last Weight  Most recent update: 05/13/2024  4:11 AM    Weight  81.5 kg (179 lb 10.8 oz)             Physical Exam Constitutional:      General:  Frail-looking, she is not in acute distress. Cardiovascular:     Rate and Rhythm: Normal rate and regular rhythm.  Pulmonary:     Effort: Pulmonary effort is normal.     Breath sounds: Normal breath sounds.  Abdominal:     General:  Bowel sounds are normal.     Palpations: Abdomen is soft.  Neurological:     Mental Status: She is alert. She is disoriented.  Psychiatric:        Mood and Affect: Mood normal.       SUMMARY OF RECOMMENDATIONS   Code Status: Maintain Full Code Goal of care is medical stabilization and recovery to the extent this is possible Continue with full scope of care per patient/family's wish/desire. Consider adding an appetite stimulant to help meet caloric requirements when diet is advanced. Continue to provide psycho-social and emotional support to patient and family Palliative medicine team will continue to follow.   Symptom Management: Per Primary team Palliative medicine is available to assist as needed.    Time Spent: 35 minutes  Detailed review of medical records including labs, imaging and vital signs,  medically appropriate exam, discussed with treatment team, counseling and education to patient, family, & staff, documenting clinical information, coordination of care.   ______________________________________________________________________________________ Kathlyne Bolder NP-C [place] Palliative Medicine Team Team Cell Phone: [phone] Please utilize secure chat with additional questions, if there is no response within 30 minutes please call the above phone number  Palliative Medicine Team providers are available by phone from 7am to 7pm daily and can be reached through the team cell phone.  Should this patient require assistance outside of these hours, please call the patient's attending physician.

## 2024-05-13 NOTE — Progress Notes (Signed)
 Brief note  Staple removal no complications. No blood loss. Covered w/ short steri strips to keep patient from scratching skin  Patient was unhappy about the staples coming out complaining of pinching pain but she sat and tolerated the removal well, aide was holding her hand and she was appreciative

## 2024-05-13 NOTE — Plan of Care (Signed)
  Problem: Education: Goal: Ability to describe self-care measures that may prevent or decrease complications (Diabetes Survival Skills Education) will improve Outcome: Progressing   Problem: Coping: Goal: Ability to adjust to condition or change in health will improve Outcome: Progressing   Problem: Health Behavior/Discharge Planning: Goal: Ability to identify and utilize available resources and services will improve Outcome: Progressing   Problem: Metabolic: Goal: Ability to maintain appropriate glucose levels will improve Outcome: Progressing   Problem: Nutritional: Goal: Maintenance of adequate nutrition will improve Outcome: Progressing   Problem: Skin Integrity: Goal: Risk for impaired skin integrity will decrease Outcome: Progressing   Problem: Education: Goal: Knowledge of General Education information will improve Description: Including pain rating scale, medication(s)/side effects and non-pharmacologic comfort measures Outcome: Progressing   Problem: Health Behavior/Discharge Planning: Goal: Ability to manage health-related needs will improve Outcome: Progressing   Problem: Activity: Goal: Risk for activity intolerance will decrease Outcome: Progressing   Problem: Nutrition: Goal: Adequate nutrition will be maintained Outcome: Progressing   Problem: Coping: Goal: Level of anxiety will decrease Outcome: Progressing   Problem: Elimination: Goal: Will not experience complications related to bowel motility Outcome: Progressing   Problem: Pain Managment: Goal: General experience of comfort will improve and/or be controlled Outcome: Progressing   Problem: Safety: Goal: Ability to remain free from injury will improve Outcome: Progressing   Problem: Skin Integrity: Goal: Risk for impaired skin integrity will decrease Outcome: Progressing

## 2024-05-13 NOTE — Progress Notes (Signed)
 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 Complaints of diffuse severe abdominal pain of unknown duration and hypertensive crisis. CTA C/A/P showed closed-loop SBO with developing ischemia. She received Kcentra for DOAC reversal and underwent emergent ex lap on 04/27/2024 with reduction of internal hernia, resection of approximately 110 cm distal small bowel with ileocecectomy, and ileocolic anastomosis. Status post transfusion of 1 unit pRBC on 11/3 for Hgb 6.4. Course complicated by postop ileus as of 11/05. IV Zosyn was stopped on 11/07 after 8 days JP drain removed on 11/08. Had hematochezia x 7 episodes on 11/9-11/10 AM with Hgb drop to nadir of 5.4. Transfused 2 units pRBC. Surgery aware, suspect bleeding at anastomosis site, inaccessible endoscopically. Has been on TPN and recently started on po diet but her intake has been poor.   Has pulled PICC line few times now. Persistent Ileus and abd pain, have had to deescalate diet back to clears. Needs staples removed and isn't allowing surgery team to do this.      Consultants:  General Surgery   Procedures/Surgeries: 04/27/24: exploratory laparotomy with small bowel resection, ileocecectomy, ileocolonic anastomosis and reduction of internal hernia       ASSESSMENT & PLAN:   # Ischemic bowel status post ex lap with SB resection plus ileocecectomy and ileocolic anastomosis (10/30) # Course complicated by post-op ileus (11/05) # Persistent/recurrent ileus (KUB 11/13) General surgery following  remains on  TPN Poor po intake, had decent intake w/ lunch today, KUB concerning for recurrent ileus, per surgery restart on CLD 11/13 and remains on this into 11/14, consider advancing tomorrow 11/15 KUB repeat 11/15 Question for feeding tube but given dementia she is a poor candidate for this - I have discussed w/ family that if she is not able to meet her caloric needs will need to discuss further  Miralax  daily Needs staples out but pt was not cooperating with this today, will try again w/ family present today, pt also seems to respond better to women so I (Dr Marsa) am happy to attempt removal ideally w/ family present and pt premedicated, RN messaged about the plan    # Acute blood loss anemia - resolved # Hematochezia - resolved  Monitor with serial H/H Continue holding Eliquis   # History of PE s/p IVC filter Eliquis held due to hematochezia and acute blood loss anemia, continue hold d/t anemia and also in case needing further procedures    # Hypertensive urgency - resolved # Primary hypertension - stable  BP stable Continue amlodipine  10 mg daily and clonidine  patch   # AKI on CKD 3a - resolved Monitor BMP   #Chronic 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 Not oriented to situation.  Complicates her care - has pulled PICC several times which is needed for TPN at this point. She's needing sitter. Surely will pull a feeding tube. Will discuss further with family. If dementia is limiting her ability  to receive safe care / if treatments are causing distress, would deescalate to focus on treating as able and prioritizing QOL   # GOC Has no insight into the severity of her medical condition.  Has no capacity to make her own medical decisions Decision-maker(s): Pt's cousin [name] is usually at bedside - he is her financial POA and pt's  brother Koren is her HCPOA. Koren assigns [name] as education officer, environmental if we cannot reach him, he asks we do NOT involve patient's children so I have removed them from her contacts per his request.  goal to wean off TPN and advance diet - if not able to accomplish this such that pt is meeting nutritional requirements, then would need to talk about alternative plan and likely hospice Not a good candidate for feeding tube  D/w [name] and Koren (by phone in the room) at bedside 11/13 - confirmed for FULL CODE, I did advise that in situations of severe weakness/malnourishment, I strongly recommend against CPR. I do not believe patient is actively dying at this point, my greatest concern is if she is not improving her po intake then she will certainly continue to decline. Time will tell. Koren and [name] state that when she was lucid years ago she mentioned ~NOT~ wanting to be a DNR so they are going by her wishes, but are open to revisit the conversation if she is getting worse / has terminal illness.  See above re dementia     overweight based on BMI: Body mass index is 27.14 kg/m.SABRA Significantly low or high BMI is associated with higher medical risk.  Underweight - under 18  overweight - 25 to 29 obese - 30 or more Class 1 obesity: BMI of 30.0 to 34 Class 2 obesity: BMI of 35.0 to 39 Class 3 obesity: BMI of 40.0 to 49 Super Morbid Obesity: BMI 50-59 Super-super Morbid Obesity: BMI 60+ Healthy nutrition and physical activity advised as adjunct to other disease management and risk reduction treatments    DVT prophylaxis: holding d/t bleed risk IV fluids: no continuous IV fluids  Nutrition: TPN and advancing po as tolerate but intake has been poor as above. CLD for now  Unumprovident / other devices: PICC   Code Status: FULL CODE ACP documentation reviewed:  none on file in [facility]  TOC needs: TBD, potential hospice / back to SNF w/ PT/OT if able  Medical barriers to dispo: TPN. Expected medical  readiness for discharge pending po intake / GOC.              Subjective / Brief ROS:  She is not oriented but she is pleasant. She reports abd pain, se does not want me to remove her staples right now    Family Communication:will update later today     Objective Findings:  Vitals:   05/12/24 1943 05/13/24 0406 05/13/24 0409 05/13/24 0759  BP: (!) 140/79 (!) 118/57  125/69  Pulse: (!) 103 86  82  Resp: 16 17  17   Temp: 98.3 F (36.8 C) 99.5 F (37.5 C)    TempSrc:  Axillary    SpO2: 100% 100%  100%  Weight:   81.5 kg   Height:        Intake/Output Summary (Last 24 hours) at 05/13/2024 1152 Last data filed at 05/13/2024 0653 Gross per 24 hour  Intake 634.17 ml  Output --  Net 634.17 ml   Filed Weights   05/11/24 0510 05/12/24 0557 05/13/24 0409  Weight: 86.2 kg 86.2  kg 81.5 kg    Examination:  Physical Exam Constitutional:      General: She is not in acute distress. Cardiovascular:     Rate and Rhythm: Normal rate and regular rhythm.     Heart sounds: Murmur heard.  Pulmonary:     Effort: Pulmonary effort is normal.     Breath sounds: Normal breath sounds.  Abdominal:     General: Bowel sounds are normal.     Palpations: Abdomen is soft.     Tenderness: There is generalized abdominal tenderness.  Neurological:     Mental Status: She is alert. She is disoriented.  Psychiatric:        Mood and Affect: Mood normal.        Behavior: Behavior normal.          Scheduled Medications:   sodium chloride    Intravenous Once   sodium chloride    Intravenous Once   acetaminophen   1,000 mg Oral Q6H   amLODipine   10 mg Oral Daily   Chlorhexidine  Gluconate Cloth  6 each Topical Daily   cloNIDine   0.1 mg Transdermal Weekly   feeding supplement  1 Container Oral TID BM   insulin  aspart  0-6 Units Subcutaneous Q6H   irbesartan  150 mg Oral Daily   levothyroxine   100 mcg Oral q morning   melatonin  5 mg Oral QHS   polyethylene glycol  17 g Oral Daily    sodium chloride  flush  10-40 mL Intracatheter Q12H    Continuous Infusions:  TPN ADULT (ION) 75 mL/hr at 05/13/24 0720   TPN ADULT (ION)      PRN Medications:  ketorolac , LORazepam, OLANZapine  zydis, mouth rinse, oxyCODONE, sodium chloride  flush, ziprasidone  Antimicrobials from admission:  Anti-infectives (From admission, onward)    Start     Dose/Rate Route Frequency Ordered Stop   04/28/24 0600  piperacillin-tazobactam (ZOSYN) IVPB 3.375 g  Status:  Discontinued        3.375 g 12.5 mL/hr over 240 Minutes Intravenous Every 8 hours 04/28/24 0256 05/05/24 1340   04/27/24 2300  cefoTEtan (CEFOTAN) 2 g in sodium chloride  0.9 % 100 mL IVPB        2 g 200 mL/hr over 30 Minutes Intravenous On call to O.R. 04/27/24 2255 04/28/24 1319           Data Reviewed:  I have personally reviewed the following...  CBC: Recent Labs  Lab 05/09/24 0536 05/10/24 0453 05/11/24 0553 05/12/24 0336 05/13/24 0630  WBC 13.8* 13.8* 12.6* 10.2 10.8*  HGB 7.5* 7.7* 8.0* 7.7* 8.0*  HCT 22.5* 23.0* 24.5* 23.3* 24.8*  MCV 96.2 96.2 97.2 97.5 99.6  PLT 280 278 302 317 354   Basic Metabolic Panel: Recent Labs  Lab 05/07/24 0535 05/07/24 0857 05/07/24 1020 05/08/24 0638 05/08/24 0733 05/11/24 0553 05/12/24 0336 05/13/24 0630  NA 137 141  --  145 143 138  --  140  K 6.8* 4.2  --  3.6 3.7 4.2  --  4.4  CL 110 114*  --  116* 114* 108  --  113*  CO2 18* 21*  --  20* 20* 22  --  22  GLUCOSE 908* 151*  --  90 86 111*  --  118*  BUN 31* 31*  --  31* 30* 19  --  22  CREATININE 0.87 0.80  --  0.79 0.81 0.73  --  0.74  CALCIUM  8.1* 7.9*  --  8.2* 8.1* 8.3*  --  8.3*  MG 2.3  --   --  2.0  --  1.9 1.8  --   PHOS 6.1*  --  3.5 3.1  --  3.2  --   --    GFR: Estimated Creatinine Clearance: 70.8 mL/min (by C-G formula based on SCr of 0.74 mg/dL). Liver Function Tests: Recent Labs  Lab 05/08/24 9361 05/11/24 0553  AST 39 31  ALT 62* 68*  ALKPHOS 80 105  BILITOT 0.7 0.2  PROT 5.4* 5.5*   ALBUMIN 2.2* 2.9*   No results for input(s): LIPASE, AMYLASE in the last 168 hours. No results for input(s): AMMONIA in the last 168 hours. Coagulation Profile: Recent Labs  Lab 05/07/24 0855  INR 1.1   Cardiac Enzymes: No results for input(s): CKTOTAL, CKMB, CKMBINDEX, TROPONINI in the last 168 hours. BNP (last 3 results) No results for input(s): PROBNP in the last 8760 hours. HbA1C: No results for input(s): HGBA1C in the last 72 hours. CBG: Recent Labs  Lab 05/12/24 0556 05/12/24 1133 05/12/24 1706 05/13/24 0005 05/13/24 0547  GLUCAP 117* 138* 135* 125* 129*   Lipid Profile: No results for input(s): CHOL, HDL, LDLCALC, TRIG, CHOLHDL, LDLDIRECT in the last 72 hours.  Thyroid Function Tests: No results for input(s): TSH, T4TOTAL, FREET4, T3FREE, THYROIDAB in the last 72 hours. Anemia Panel: No results for input(s): VITAMINB12, FOLATE, FERRITIN, TIBC, IRON, RETICCTPCT in the last 72 hours. Most Recent Urinalysis On File:     Component Value Date/Time   COLORURINE STRAW (A) 04/27/2024 2041   APPEARANCEUR CLEAR (A) 04/27/2024 2041   APPEARANCEUR Clear 04/01/2013 1228   LABSPEC 1.017 04/27/2024 2041   LABSPEC 1.014 04/01/2013 1228   PHURINE 6.0 04/27/2024 2041   GLUCOSEU >=500 (A) 04/27/2024 2041   GLUCOSEU Negative 04/01/2013 1228   HGBUR NEGATIVE 04/27/2024 2041   BILIRUBINUR NEGATIVE 04/27/2024 2041   BILIRUBINUR Negative 04/01/2013 1228   KETONESUR 5 (A) 04/27/2024 2041   PROTEINUR 30 (A) 04/27/2024 2041   NITRITE NEGATIVE 04/27/2024 2041   LEUKOCYTESUR TRACE (A) 04/27/2024 2041   LEUKOCYTESUR Negative 04/01/2013 1228   Sepsis Labs: @LABRCNTIP (procalcitonin:4,lacticidven:4) Microbiology: No results found for this or any previous visit (from the past 240 hours).    Radiology Studies last 3 days: DG Abd 2 Views Result Date: 05/13/2024 CLINICAL DATA:  Ileus. EXAM: ABDOMEN - 2 VIEW COMPARISON:  05/11/2024  FINDINGS: Gas distended small bowel in the left abdomen is similar to prior. Decreased gas in the transverse colon with residual gas visible in the nondilated splenic flexure, descending colon, and rectum. IVC filter in embolization coils overlie the medial right abdomen. IMPRESSION: Gas distended small bowel in the left abdomen is similar to prior. Gas is seen in the nondilated left colon and rectum. Electronically Signed   By: Camellia Candle M.D.   On: 05/13/2024 07:29   US  EKG SITE RITE Result Date: 05/12/2024 If Site Rite image not attached, placement could not be confirmed due to current cardiac rhythm.  DG Abd 2 Views Result Date: 05/11/2024 CLINICAL DATA:  Abdominal pain. Status post recent laparotomy with bowel resection. EXAM: DG ABDOMEN 2V COMPARISON:  CT 05/03/2024 FINDINGS: Gaseous small bowel in the left abdomen persist, small bowel distention up to 4.4 cm. Enteric sutures noted in the right abdomen. Small volume formed stool in the colon. Cholecystectomy clips. IVC filter in place. Vascular calcifications. Anterior skin staples. Drains on prior CT are not confidently demonstrated by radiograph. IMPRESSION: Persistent gaseous small bowel distention in the left abdomen, small bowel distention up to  4.4 cm. Favor postoperative ileus, although obstruction is also considered in the appropriate clinical setting. Electronically Signed   By: Andrea Gasman M.D.   On: 05/11/2024 12:03       Time spent: 50 min     Aubery Douthat, DO [facility] 05/13/2024, 11:52 AM    Dictation software may have been used to generate the above note. Typos may occur and escape review in typed/dictated notes. Please contact Dr Marsa directly for clarity if needed.  Staff may message me via secure chat in Epic  but this may not receive an immediate response,  please page me for urgent matters!  If 7PM-7AM, please contact night coverage [email or web address]

## 2024-05-13 NOTE — Progress Notes (Signed)
 Patient ID: Kylie [name], female   DOB: [birth year], 68 y.o.   MRN: [id number]     SURGICAL PROGRESS NOTE   Hospital Day(s): 16.   Interval History: Patient seen and examined, no acute events or new complaints overnight. Patient was found sleeping comfortably.  She easily woke up upon entering the room.  She was not too communicative just saying yes or no to simple questions.  She denies any significant abdominal pain.  She endorses not passing gas but denies any nausea.  She also said no when asked if she drank anything for leg or liquid diet.  Vital signs in last 24 hours: [min-max] current  Temp:  [98.3 F (36.8 C)-99.5 F (37.5 C)] 99.5 F (37.5 C) (11/15 0406) Pulse Rate:  [82-103] 82 (11/15 0759) Resp:  [16-18] 17 (11/15 0759) BP: (118-140)/(57-79) 125/69 (11/15 0759) SpO2:  [100 %] 100 % (11/15 0759) Weight:  [81.5 kg] 81.5 kg (11/15 0409)     Height: 5' 10 (177.8 cm) Weight: 81.5 kg (SCD machine was removed from bed to get pts more accurate weight) BMI (Calculated): 25.78   Physical Exam:  Constitutional: No distress, drowsy Respiratory: breathing non-labored at rest  Cardiovascular: regular rate and sinus rhythm  Gastrointestinal: soft, non-tender, and non-distended.  Incision dry and clean  Labs:     Latest Ref Rng & Units 05/13/2024    6:30 AM 05/12/2024    3:36 AM 05/11/2024    5:53 AM  CBC  WBC 4.0 - 10.5 K/uL 10.8  10.2  12.6   Hemoglobin 12.0 - 15.0 g/dL 8.0  7.7  8.0   Hematocrit 36.0 - 46.0 % 24.8  23.3  24.5   Platelets 150 - 400 K/uL 354  317  302       Latest Ref Rng & Units 05/13/2024    6:30 AM 05/11/2024    5:53 AM 05/08/2024    7:33 AM  CMP  Glucose 70 - 99 mg/dL 881  888  86   BUN 8 - 23 mg/dL 22  19  30    Creatinine 0.44 - 1.00 mg/dL 9.25  9.26  9.18   Sodium 135 - 145 mmol/L 140  138  143   Potassium 3.5 - 5.1 mmol/L 4.4  4.2  3.7   Chloride 98 - 111 mmol/L 113  108  114   CO2 22 - 32 mmol/L 22  22  20    Calcium  8.9 - 10.3 mg/dL 8.3  8.3   8.1   Total Protein 6.5 - 8.1 g/dL  5.5    Total Bilirubin 0.0 - 1.2 mg/dL  0.2    Alkaline Phos 38 - 126 U/L  105    AST 15 - 41 U/L  31    ALT 0 - 44 U/L  68      Imaging studies: Abdominal x-ray this morning shows persistent left abdomen small bowel dilation.  Less gas on the large intestine compared to previous imaging.  I personally evaluated the images.   Assessment/Plan:  68 y.o. female with intestinal ischemia 16 Days Post-Op s/p small bowel resection, sick ectomy with ileocolonic anastomosis, complicated by pertinent comorbidities including postoperative ileus.  - No significant improvement clinically or by imaging.  Abdominal x-ray consistent with persistent ileus. - As per patient she did not drink anything for likely liquid diet.  Will continue with current care liquid diet - Will continue TPN, full rate - Continue pain management - Encouraged the patient to mobilize  Lucas Petrin, MD

## 2024-05-13 NOTE — Plan of Care (Signed)
  Problem: Clinical Measurements: Goal: Respiratory complications will improve Outcome: Progressing   Problem: Activity: Goal: Risk for activity intolerance will decrease Outcome: Progressing   Problem: Nutrition: Goal: Adequate nutrition will be maintained Outcome: Progressing   Problem: Pain Managment: Goal: General experience of comfort will improve and/or be controlled Outcome: Progressing   Problem: Safety: Goal: Ability to remain free from injury will improve Outcome: Progressing

## 2024-05-13 NOTE — Plan of Care (Signed)
  Problem: Education: Goal: Ability to describe self-care measures that may prevent or decrease complications (Diabetes Survival Skills Education) will improve 05/13/2024 1419 by Kateri Caprice HERO, RN Outcome: Progressing 05/13/2024 1416 by Kateri Caprice HERO, RN Outcome: Progressing   Problem: Coping: Goal: Ability to adjust to condition or change in health will improve 05/13/2024 1419 by Kateri Caprice HERO, RN Outcome: Progressing 05/13/2024 1416 by Kateri Caprice HERO, RN Outcome: Progressing   Problem: Fluid Volume: Goal: Ability to maintain a balanced intake and output will improve Outcome: Progressing   Problem: Health Behavior/Discharge Planning: Goal: Ability to identify and utilize available resources and services will improve 05/13/2024 1419 by Kateri Caprice HERO, RN Outcome: Progressing 05/13/2024 1416 by Kateri Caprice HERO, RN Outcome: Progressing   Problem: Metabolic: Goal: Ability to maintain appropriate glucose levels will improve 05/13/2024 1419 by Kateri Caprice HERO, RN Outcome: Progressing 05/13/2024 1416 by Kateri Caprice HERO, RN Outcome: Progressing   Problem: Nutritional: Goal: Maintenance of adequate nutrition will improve 05/13/2024 1419 by Kateri Caprice HERO, RN Outcome: Progressing 05/13/2024 1416 by Kateri Caprice HERO, RN Outcome: Progressing   Problem: Skin Integrity: Goal: Risk for impaired skin integrity will decrease 05/13/2024 1419 by Kateri Caprice HERO, RN Outcome: Progressing 05/13/2024 1416 by Kateri Caprice HERO, RN Outcome: Progressing   Problem: Tissue Perfusion: Goal: Adequacy of tissue perfusion will improve Outcome: Progressing   Problem: Education: Goal: Knowledge of General Education information will improve Description: Including pain rating scale, medication(s)/side effects and non-pharmacologic comfort measures Outcome: Progressing   Problem: Health Behavior/Discharge Planning: Goal: Ability to manage health-related needs will improve Outcome: Progressing    Problem: Activity: Goal: Risk for activity intolerance will decrease 05/13/2024 1419 by Kateri Caprice HERO, RN Outcome: Progressing 05/13/2024 1416 by Kateri Caprice HERO, RN Outcome: Progressing   Problem: Nutrition: Goal: Adequate nutrition will be maintained 05/13/2024 1419 by Kateri Caprice HERO, RN Outcome: Progressing 05/13/2024 1416 by Kateri Caprice HERO, RN Outcome: Progressing   Problem: Coping: Goal: Level of anxiety will decrease 05/13/2024 1419 by Kateri Caprice HERO, RN Outcome: Progressing 05/13/2024 1416 by Kateri Caprice HERO, RN Outcome: Progressing   Problem: Elimination: Goal: Will not experience complications related to bowel motility 05/13/2024 1419 by Kateri Caprice HERO, RN Outcome: Progressing 05/13/2024 1416 by Kateri Caprice HERO, RN Outcome: Progressing   Problem: Pain Managment: Goal: General experience of comfort will improve and/or be controlled 05/13/2024 1419 by Kateri Caprice HERO, RN Outcome: Progressing 05/13/2024 1416 by Kateri Caprice HERO, RN Outcome: Progressing   Problem: Safety: Goal: Ability to remain free from injury will improve 05/13/2024 1419 by Kateri Caprice HERO, RN Outcome: Progressing 05/13/2024 1416 by Kateri Caprice HERO, RN Outcome: Progressing   Problem: Skin Integrity: Goal: Risk for impaired skin integrity will decrease 05/13/2024 1419 by Kateri Caprice HERO, RN Outcome: Progressing 05/13/2024 1416 by Kateri Caprice HERO, RN Outcome: Progressing

## 2024-05-14 ENCOUNTER — Inpatient Hospital Stay

## 2024-05-14 DIAGNOSIS — K559 Vascular disorder of intestine, unspecified: Secondary | ICD-10-CM | POA: Diagnosis not present

## 2024-05-14 DIAGNOSIS — Z515 Encounter for palliative care: Secondary | ICD-10-CM | POA: Diagnosis not present

## 2024-05-14 LAB — GLUCOSE, CAPILLARY
Glucose-Capillary: 112 mg/dL — ABNORMAL HIGH (ref 70–99)
Glucose-Capillary: 118 mg/dL — ABNORMAL HIGH (ref 70–99)
Glucose-Capillary: 121 mg/dL — ABNORMAL HIGH (ref 70–99)
Glucose-Capillary: 134 mg/dL — ABNORMAL HIGH (ref 70–99)

## 2024-05-14 MED ORDER — TRACE MINERALS CU-MN-SE-ZN 300-55-60-3000 MCG/ML IV SOLN
INTRAVENOUS | Status: AC
  Filled 2024-05-14: qty 720

## 2024-05-14 NOTE — Progress Notes (Signed)
 Patient ID: Kylie [name], female   DOB: 10-11-1955, 68 y.o.   MRN: [id number]     SURGICAL PROGRESS NOTE   Hospital Day(s): 17.   Interval History: Patient seen and examined, no acute events or new complaints overnight. Patient reports she is feeling sick.  She told me this morning that she generally tried a liquid diet.  She called her phrase when I got into the room with the bed sheets.  I was only able to examine her abdomen through the bed sheets.  She was complaining that she was uncomfortable but did not complain of severe abdominal pain or tenderness.  Vital signs in last 24 hours: [min-max] current  Temp:  [98.1 F (36.7 C)-99.1 F (37.3 C)] 99.1 F (37.3 C) (11/16 0742) Pulse Rate:  [91-98] 91 (11/16 0742) Resp:  [16-20] 16 (11/16 0742) BP: (120-139)/(60-79) 123/70 (11/16 0742) SpO2:  [100 %] 100 % (11/16 0742) Weight:  [82.1 kg] 82.1 kg (11/16 0500)     Height: 5' 10 (177.8 cm) Weight: 82.1 kg BMI (Calculated): 25.97   Physical Exam:  Constitutional: alert, cooperative and no distress  Respiratory: breathing non-labored at rest  Cardiovascular: regular rate and sinus rhythm  Gastrointestinal: soft, non-tender, and non-distended  Labs:     Latest Ref Rng & Units 05/13/2024    6:30 AM 05/12/2024    3:36 AM 05/11/2024    5:53 AM  CBC  WBC 4.0 - 10.5 K/uL 10.8  10.2  12.6   Hemoglobin 12.0 - 15.0 g/dL 8.0  7.7  8.0   Hematocrit 36.0 - 46.0 % 24.8  23.3  24.5   Platelets 150 - 400 K/uL 354  317  302       Latest Ref Rng & Units 05/13/2024    6:30 AM 05/11/2024    5:53 AM 05/08/2024    7:33 AM  CMP  Glucose 70 - 99 mg/dL 881  888  86   BUN 8 - 23 mg/dL 22  19  30    Creatinine 0.44 - 1.00 mg/dL 9.25  9.26  9.18   Sodium 135 - 145 mmol/L 140  138  143   Potassium 3.5 - 5.1 mmol/L 4.4  4.2  3.7   Chloride 98 - 111 mmol/L 113  108  114   CO2 22 - 32 mmol/L 22  22  20    Calcium  8.9 - 10.3 mg/dL 8.3  8.3  8.1   Total Protein 6.5 - 8.1 g/dL  5.5    Total Bilirubin  0.0 - 1.2 mg/dL  0.2    Alkaline Phos 38 - 126 U/L  105    AST 15 - 41 U/L  31    ALT 0 - 44 U/L  68      Imaging studies: No new pertinent imaging studies   Assessment/Plan:  68 y.o. female with intestinal ischemia 16 Days Post-Op s/p small bowel resection, sick ectomy with ileocolonic anastomosis, complicated by pertinent comorbidities including postoperative ileus.   -Clinically difficult to assess this morning since patient cover herself with the bed sheets.  Physical exam was over the blankets. -As per chart review there were multiple bowel movements reported last night.  This should be a good sign. -Upon asking the patient she was complaining that she was feeling sick and wanted to get liquids.  If that is the case we will not be able to advance her to full liquids.  If she feels more comfortable during the day and she want to try full  liquids, she can be advanced if she continue passing gas or having bowel movements. - Will need to continue TPN full rate until we know that she is getting enough calories - Continue pain management - Encouraged the patient to mobilize - Appreciate hospitalist management with this patient.  Lucas Petrin, MD

## 2024-05-14 NOTE — Plan of Care (Signed)
  Problem: Fluid Volume: Goal: Ability to maintain a balanced intake and output will improve Outcome: Progressing   Problem: Tissue Perfusion: Goal: Adequacy of tissue perfusion will improve Outcome: Progressing   Problem: Clinical Measurements: Goal: Will remain free from infection Outcome: Progressing   Problem: Clinical Measurements: Goal: Diagnostic test results will improve Outcome: Progressing   Problem: Clinical Measurements: Goal: Respiratory complications will improve Outcome: Progressing

## 2024-05-14 NOTE — Progress Notes (Incomplete)
 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 Complaints of diffuse severe abdominal pain of unknown duration and hypertensive crisis. CTA C/A/P showed closed-loop SBO with developing ischemia. She received Kcentra for DOAC reversal and underwent emergent ex lap on 04/27/2024 with reduction of internal hernia, resection of approximately 110 cm distal small bowel with ileocecectomy, and ileocolic anastomosis. Status post transfusion of 1 unit pRBC on 11/3 for Hgb 6.4. Course complicated by postop ileus as of 11/05. IV Zosyn was stopped on 11/07 after 8 days JP drain removed on 11/08. Had hematochezia x 7 episodes on 11/9-11/10 AM with Hgb drop to nadir of 5.4. Transfused 2 units pRBC. Surgery aware, suspect bleeding at anastomosis site, inaccessible endoscopically. Has been on TPN and recently started on po diet but her intake has been poor. Has pulled PICC line few times now. Persistent Ileus and abd pain worse 11/13 into 11/16, have had to deescalate diet back to clears and *** advance per surgery.     Consultants:  General Surgery   Procedures/Surgeries: 04/27/24: exploratory laparotomy with small bowel resection, ileocecectomy, ileocolonic anastomosis and reduction of internal hernia      ASSESSMENT & PLAN:   # Ischemic bowel status post ex lap with SB resection plus ileocecectomy and ileocolic anastomosis (10/30) # Course complicated by post-op ileus (11/05) # Persistent/recurrent ileus (KUB 11/13, 11/15) General surgery following  remains on TPN restart on  CLD 11/13 and remains on this into 11/16, advance per surgery  Question for feeding tube but given dementia she is a poor candidate for this - I have discussed w/ family that if she is not able to meet her caloric needs will need to discuss further but given has pulled PICC multiple times she will surely do same with tube.   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 Not oriented to situation.  Complicates her care - has pulled PICC several times which is needed for TPN at this point. She's needing sitter. Surely will pull a feeding tube. Will discuss further with family. If dementia is limiting her ability to receive safe care / if treatments are causing distress, would deescalate to focus on treating as able and prioritizing QOL   # GOC Patient has no insight into the severity of her medical condition. Has no capacity to make her own medical decisions Decision-maker(s): Pt's cousin [name] is usually at bedside -  he is her financial POA and pt's brother Koren is her HCPOA. If we are unable to reach Koren, he assigns [employer] as education officer, environmental and he asks we do NOT involve patient's children so I have inactivated them in her contacts per his request.  goal to wean off TPN and advance diet - if not able to accomplish this such that pt is meeting  nutritional requirements, then would need to talk about alternative plan and likely hospice Not a good candidate for feeding tube  D/w [name] and Koren (by phone in the room) at bedside 11/13 - confirmed for FULL CODE, I did advise that in situations of severe weakness/malnourishment, I strongly recommend against CPR. I do not believe patient is actively dying at this point, my greatest concern is if she is not improving her po intake then she will certainly continue to decline. Time will tell. Koren and [name] state that when she was lucid years ago she mentioned ~NOT~ wanting to be a DNR so they are going by her wishes, but are open to revisit the conversation if she is getting worse / has terminal illness.  See above re dementia     overweight based on BMI: Body mass index is 27.14 kg/m.SABRA Significantly low or high BMI is associated with higher medical risk.  Underweight - under 18  overweight - 25 to 29 obese - 30 or more Class 1 obesity: BMI of 30.0 to 34 Class 2 obesity: BMI of 35.0 to 39 Class 3 obesity: BMI of 40.0 to 49 Super Morbid Obesity: BMI 50-59 Super-super Morbid Obesity: BMI 60+ Healthy nutrition and physical activity advised as adjunct to other disease management and risk reduction treatments    DVT prophylaxis: holding d/t bleed risk IV fluids: no continuous IV fluids  Nutrition: TPN and advancing po as tolerate but intake has been poor as above. CLD for now  Unumprovident / other devices: PICC   Code Status: FULL CODE ACP documentation reviewed:  none on file in [facility]  TOC needs: TBD, potential hospice / back to SNF w/ PT/OT if able  Medical barriers to dispo: TPN. Expected medical readiness for discharge pending po intake / GOC.              Subjective / Brief ROS:  She is not oriented but she is pleasant. She reports abd pain, se does not want me to remove her staples right now    Family Communication:will update later today     Objective  Findings:  Vitals:   05/13/24 1843 05/14/24 0411 05/14/24 0500 05/14/24 0742  BP: 120/60 132/75  123/70  Pulse: 94 98  91  Resp: 18 20  16   Temp:  98.1 F (36.7 C)  99.1 F (37.3 C)  TempSrc:  Oral    SpO2: 100% 100%  100%  Weight:   82.1 kg   Height:        Intake/Output Summary (Last 24 hours) at 05/14/2024 0848 Last data filed at 05/14/2024 0600 Gross per 24 hour  Intake 1319.04 ml  Output --  Net 1319.04 ml   Filed Weights   05/12/24 0557 05/13/24 0409 05/14/24 0500  Weight: 86.2 kg 81.5 kg 82.1 kg    Examination:  Physical Exam Constitutional:      General: She is not in acute distress. Cardiovascular:     Rate and Rhythm: Normal rate and regular rhythm.     Heart sounds: Murmur heard.  Pulmonary:     Effort: Pulmonary effort is  normal.     Breath sounds: Normal breath sounds.  Abdominal:     General: Bowel sounds are normal.     Palpations: Abdomen is soft.     Tenderness: There is generalized abdominal tenderness.  Neurological:     Mental Status: She is alert. She is disoriented.  Psychiatric:        Mood and Affect: Mood normal.        Behavior: Behavior normal.          Scheduled Medications:   sodium chloride    Intravenous Once   sodium chloride    Intravenous Once   acetaminophen   1,000 mg Oral Q6H   amLODipine   10 mg Oral Daily   Chlorhexidine  Gluconate Cloth  6 each Topical Daily   cloNIDine   0.1 mg Transdermal Weekly   feeding supplement  1 Container Oral TID BM   insulin  aspart  0-6 Units Subcutaneous Q6H   irbesartan  150 mg Oral Daily   levothyroxine   100 mcg Oral q morning   melatonin  5 mg Oral QHS   polyethylene glycol  17 g Oral Daily   sodium chloride  flush  10-40 mL Intracatheter Q12H    Continuous Infusions:  TPN ADULT (ION) 75 mL/hr at 05/13/24 1735    PRN Medications:  ketorolac , LORazepam, OLANZapine  zydis, mouth rinse, oxyCODONE, sodium chloride  flush, ziprasidone  Antimicrobials from admission:   Anti-infectives (From admission, onward)    Start     Dose/Rate Route Frequency Ordered Stop   04/28/24 0600  piperacillin-tazobactam (ZOSYN) IVPB 3.375 g  Status:  Discontinued        3.375 g 12.5 mL/hr over 240 Minutes Intravenous Every 8 hours 04/28/24 0256 05/05/24 1340   04/27/24 2300  cefoTEtan (CEFOTAN) 2 g in sodium chloride  0.9 % 100 mL IVPB        2 g 200 mL/hr over 30 Minutes Intravenous On call to O.R. 04/27/24 2255 04/28/24 1319           Data Reviewed:  I have personally reviewed the following...  CBC: Recent Labs  Lab 05/09/24 0536 05/10/24 0453 05/11/24 0553 05/12/24 0336 05/13/24 0630  WBC 13.8* 13.8* 12.6* 10.2 10.8*  HGB 7.5* 7.7* 8.0* 7.7* 8.0*  HCT 22.5* 23.0* 24.5* 23.3* 24.8*  MCV 96.2 96.2 97.2 97.5 99.6  PLT 280 278 302 317 354   Basic Metabolic Panel: Recent Labs  Lab 05/07/24 0857 05/07/24 1020 05/08/24 0638 05/08/24 0733 05/11/24 0553 05/12/24 0336 05/13/24 0630  NA 141  --  145 143 138  --  140  K 4.2  --  3.6 3.7 4.2  --  4.4  CL 114*  --  116* 114* 108  --  113*  CO2 21*  --  20* 20* 22  --  22  GLUCOSE 151*  --  90 86 111*  --  118*  BUN 31*  --  31* 30* 19  --  22  CREATININE 0.80  --  0.79 0.81 0.73  --  0.74  CALCIUM  7.9*  --  8.2* 8.1* 8.3*  --  8.3*  MG  --   --  2.0  --  1.9 1.8  --   PHOS  --  3.5 3.1  --  3.2  --   --    GFR: Estimated Creatinine Clearance: 70.8 mL/min (by C-G formula based on SCr of 0.74 mg/dL). Liver Function Tests: Recent Labs  Lab 05/08/24 0638 05/11/24 0553  AST 39 31  ALT 62* 68*  ALKPHOS  80 105  BILITOT 0.7 0.2  PROT 5.4* 5.5*  ALBUMIN 2.2* 2.9*   No results for input(s): LIPASE, AMYLASE in the last 168 hours. No results for input(s): AMMONIA in the last 168 hours. Coagulation Profile: Recent Labs  Lab 05/07/24 0855  INR 1.1   Cardiac Enzymes: No results for input(s): CKTOTAL, CKMB, CKMBINDEX, TROPONINI in the last 168 hours. BNP (last 3 results) No results  for input(s): PROBNP in the last 8760 hours. HbA1C: No results for input(s): HGBA1C in the last 72 hours. CBG: Recent Labs  Lab 05/13/24 0547 05/13/24 1210 05/13/24 1626 05/14/24 0034 05/14/24 0633  GLUCAP 129* 112* 136* 134* 118*   Lipid Profile: No results for input(s): CHOL, HDL, LDLCALC, TRIG, CHOLHDL, LDLDIRECT in the last 72 hours.  Thyroid Function Tests: No results for input(s): TSH, T4TOTAL, FREET4, T3FREE, THYROIDAB in the last 72 hours. Anemia Panel: No results for input(s): VITAMINB12, FOLATE, FERRITIN, TIBC, IRON, RETICCTPCT in the last 72 hours. Most Recent Urinalysis On File:     Component Value Date/Time   COLORURINE STRAW (A) 04/27/2024 2041   APPEARANCEUR CLEAR (A) 04/27/2024 2041   APPEARANCEUR Clear 04/01/2013 1228   LABSPEC 1.017 04/27/2024 2041   LABSPEC 1.014 04/01/2013 1228   PHURINE 6.0 04/27/2024 2041   GLUCOSEU >=500 (A) 04/27/2024 2041   GLUCOSEU Negative 04/01/2013 1228   HGBUR NEGATIVE 04/27/2024 2041   BILIRUBINUR NEGATIVE 04/27/2024 2041   BILIRUBINUR Negative 04/01/2013 1228   KETONESUR 5 (A) 04/27/2024 2041   PROTEINUR 30 (A) 04/27/2024 2041   NITRITE NEGATIVE 04/27/2024 2041   LEUKOCYTESUR TRACE (A) 04/27/2024 2041   LEUKOCYTESUR Negative 04/01/2013 1228   Sepsis Labs: @LABRCNTIP (procalcitonin:4,lacticidven:4) Microbiology: No results found for this or any previous visit (from the past 240 hours).    Radiology Studies last 3 days: DG Chest Port 1 View Result Date: 05/14/2024 CLINICAL DATA:  PICC line placement EXAM: PORTABLE CHEST 1 VIEW COMPARISON:  05/03/2024 FINDINGS: Left-sided PICC line tip overlies the mid to distal SVC. Lungs are clear bilaterally. The cardiopericardial silhouette is within normal limits for size. No acute bony abnormality. IMPRESSION: Left-sided PICC line tip overlies the mid to distal SVC. Electronically Signed   By: Camellia Candle M.D.   On: 05/14/2024 04:55   DG Abd 2  Views Result Date: 05/13/2024 CLINICAL DATA:  Ileus. EXAM: ABDOMEN - 2 VIEW COMPARISON:  05/11/2024 FINDINGS: Gas distended small bowel in the left abdomen is similar to prior. Decreased gas in the transverse colon with residual gas visible in the nondilated splenic flexure, descending colon, and rectum. IVC filter in embolization coils overlie the medial right abdomen. IMPRESSION: Gas distended small bowel in the left abdomen is similar to prior. Gas is seen in the nondilated left colon and rectum. Electronically Signed   By: Camellia Candle M.D.   On: 05/13/2024 07:29   US  EKG SITE RITE Result Date: 05/12/2024 If Site Rite image not attached, placement could not be confirmed due to current cardiac rhythm.  DG Abd 2 Views Result Date: 05/11/2024 CLINICAL DATA:  Abdominal pain. Status post recent laparotomy with bowel resection. EXAM: DG ABDOMEN 2V COMPARISON:  CT 05/03/2024 FINDINGS: Gaseous small bowel in the left abdomen persist, small bowel distention up to 4.4 cm. Enteric sutures noted in the right abdomen. Small volume formed stool in the colon. Cholecystectomy clips. IVC filter in place. Vascular calcifications. Anterior skin staples. Drains on prior CT are not confidently demonstrated by radiograph. IMPRESSION: Persistent gaseous small bowel distention in the left abdomen, small  bowel distention up to 4.4 cm. Favor postoperative ileus, although obstruction is also considered in the appropriate clinical setting. Electronically Signed   By: Andrea Gasman M.D.   On: 05/11/2024 12:03       Time spent: 50 min     Shaquill Iseman, DO [facility] 05/14/2024, 8:48 AM    Dictation software may have been used to generate the above note. Typos may occur and escape review in typed/dictated notes. Please contact Dr Marsa directly for clarity if needed.  Staff may message me via secure chat in Epic  but this may not receive an immediate response,  please page me for urgent  matters!  If 7PM-7AM, please contact night coverage [email or web address]

## 2024-05-14 NOTE — Progress Notes (Signed)
 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 Complaints of diffuse severe abdominal pain of unknown duration and hypertensive crisis. CTA C/A/P showed closed-loop SBO with developing ischemia. She received Kcentra for DOAC reversal and underwent emergent ex lap on 04/27/2024 with reduction of internal hernia, resection of approximately 110 cm distal small bowel with ileocecectomy, and ileocolic anastomosis. Status post transfusion of 1 unit pRBC on 11/3 for Hgb 6.4. Course complicated by postop ileus as of 11/05. IV Zosyn was stopped on 11/07 after 8 days JP drain removed on 11/08. Had hematochezia x 7 episodes on 11/9-11/10 AM with Hgb drop to nadir of 5.4. Transfused 2 units pRBC. Surgery aware, suspect bleeding at anastomosis site, inaccessible endoscopically. Has been on TPN and recently started on po diet but her intake has been poor. Has pulled PICC line few times now. Persistent Ileus and abd pain worse 11/13 and persistent into 11/16, have had to deescalate diet back to clears and consider advance as able     Consultants:  General Surgery   Procedures/Surgeries: 04/27/24: exploratory laparotomy with small bowel resection, ileocecectomy, ileocolonic anastomosis and reduction of internal hernia      ASSESSMENT & PLAN:   # Ischemic bowel status post ex lap with SB resection plus ileocecectomy and ileocolic anastomosis (10/30) # Course complicated by post-op ileus (11/05) # Persistent/recurrent ileus (KUB 11/13, 11/15) General surgery following  remains on  TPN restart on CLD 11/13 and remains on this into 11/16, advance as able Question for feeding tube but given dementia she is a poor candidate for this - I have discussed w/ family that if she is not able to meet her caloric needs will need to discuss further but given has pulled PICC multiple times she will surely do same with tube.   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 Not oriented to situation.  Complicates her care - has pulled PICC several times which is needed for TPN at this point. She's needing sitter. Surely will pull a feeding tube. Will discuss further with family. If dementia is limiting her ability to receive safe care / if treatments are causing distress, would deescalate to focus on treating as able and prioritizing QOL   # GOC Patient has no insight into the severity of her medical condition. Has no capacity to make her own medical decisions Decision-maker(s): Pt's cousin [name] is usually at bedside -  he is her financial POA and pt's brother Koren is her HCPOA. If we are unable to reach Koren, he assigns [employer] as education officer, environmental and he asks we do NOT involve patient's children so I have inactivated them in her contacts per his request.  goal to wean off TPN and advance diet - if not able to accomplish this such that pt is meeting  nutritional requirements, then would need to talk about alternative plan and likely hospice Not a good candidate for feeding tube  D/w [name] and Koren (by phone in the room) at bedside 11/13 - confirmed for FULL CODE, I did advise that in situations of severe weakness/malnourishment, I strongly recommend against CPR. I do not believe patient is actively dying at this point, my greatest concern is if she is not improving her po intake then she will certainly continue to decline. Time will tell. Koren and [name] state that when she was lucid years ago she mentioned ~NOT~ wanting to be a DNR so they are going by her wishes, but are open to revisit the conversation if she is getting worse / has terminal illness.  See above re dementia     overweight based on BMI: Body mass index is 27.14 kg/m.SABRA Significantly low or high BMI is associated with higher medical risk.  Underweight - under 18  overweight - 25 to 29 obese - 30 or more Class 1 obesity: BMI of 30.0 to 34 Class 2 obesity: BMI of 35.0 to 39 Class 3 obesity: BMI of 40.0 to 49 Super Morbid Obesity: BMI 50-59 Super-super Morbid Obesity: BMI 60+ Healthy nutrition and physical activity advised as adjunct to other disease management and risk reduction treatments     DVT prophylaxis: holding d/t bleed risk IV fluids: no continuous IV fluids  Nutrition: TPN and advancing po as tolerate but intake has been poor as above. CLD for now  Unumprovident / other devices: PICC   Code Status: FULL CODE ACP documentation reviewed:  none on file in [facility]  TOC needs: TBD, potential hospice / back to SNF w/ PT/OT if able  Medical barriers to dispo: TPN. Expected medical readiness for discharge pending po intake / GOC.              Subjective / Brief ROS:  She is not oriented but she is pleasant. She reports abd pain   Family Communication: spoke to son over the phone yesterday. Unable to reach him today so I called [name]    Objective  Findings:  Vitals:   05/14/24 0411 05/14/24 0500 05/14/24 0742 05/14/24 1456  BP: 132/75  123/70 120/69  Pulse: 98  91 92  Resp: 20  16 18   Temp: 98.1 F (36.7 C)  99.1 F (37.3 C) 98.5 F (36.9 C)  TempSrc: Oral     SpO2: 100%  100% 100%  Weight:  82.1 kg    Height:        Intake/Output Summary (Last 24 hours) at 05/14/2024 1811 Last data filed at 05/14/2024 1530 Gross per 24 hour  Intake 899.04 ml  Output 260 ml  Net 639.04 ml   Filed Weights   05/12/24 0557 05/13/24 0409 05/14/24 0500  Weight: 86.2 kg 81.5 kg 82.1 kg    Examination:  Physical Exam Constitutional:      General: She is not in acute distress. Cardiovascular:     Rate and Rhythm: Normal rate and regular rhythm.     Heart sounds: Murmur heard.  Pulmonary:  Effort: Pulmonary effort is normal.     Breath sounds: Normal breath sounds.  Abdominal:     General: Bowel sounds are normal.     Palpations: Abdomen is soft.     Tenderness: There is generalized abdominal tenderness.  Neurological:     Mental Status: She is alert. She is disoriented.  Psychiatric:        Mood and Affect: Mood normal.        Behavior: Behavior normal.          Scheduled Medications:   sodium chloride    Intravenous Once   sodium chloride    Intravenous Once   acetaminophen   1,000 mg Oral Q6H   amLODipine   10 mg Oral Daily   Chlorhexidine  Gluconate Cloth  6 each Topical Daily   cloNIDine   0.1 mg Transdermal Weekly   feeding supplement  1 Container Oral TID BM   insulin  aspart  0-6 Units Subcutaneous Q6H   irbesartan  150 mg Oral Daily   levothyroxine   100 mcg Oral q morning   melatonin  5 mg Oral QHS   polyethylene glycol  17 g Oral Daily   sodium chloride  flush  10-40 mL Intracatheter Q12H    Continuous Infusions:  TPN ADULT (ION) 75 mL/hr at 05/14/24 1700    PRN Medications:  ketorolac , LORazepam, OLANZapine  zydis, mouth rinse, oxyCODONE, sodium chloride  flush, ziprasidone  Antimicrobials from  admission:  Anti-infectives (From admission, onward)    Start     Dose/Rate Route Frequency Ordered Stop   04/28/24 0600  piperacillin-tazobactam (ZOSYN) IVPB 3.375 g  Status:  Discontinued        3.375 g 12.5 mL/hr over 240 Minutes Intravenous Every 8 hours 04/28/24 0256 05/05/24 1340   04/27/24 2300  cefoTEtan (CEFOTAN) 2 g in sodium chloride  0.9 % 100 mL IVPB        2 g 200 mL/hr over 30 Minutes Intravenous On call to O.R. 04/27/24 2255 04/28/24 1319           Data Reviewed:  I have personally reviewed the following...  CBC: Recent Labs  Lab 05/09/24 0536 05/10/24 0453 05/11/24 0553 05/12/24 0336 05/13/24 0630  WBC 13.8* 13.8* 12.6* 10.2 10.8*  HGB 7.5* 7.7* 8.0* 7.7* 8.0*  HCT 22.5* 23.0* 24.5* 23.3* 24.8*  MCV 96.2 96.2 97.2 97.5 99.6  PLT 280 278 302 317 354   Basic Metabolic Panel: Recent Labs  Lab 05/08/24 0638 05/08/24 0733 05/11/24 0553 05/12/24 0336 05/13/24 0630  NA 145 143 138  --  140  K 3.6 3.7 4.2  --  4.4  CL 116* 114* 108  --  113*  CO2 20* 20* 22  --  22  GLUCOSE 90 86 111*  --  118*  BUN 31* 30* 19  --  22  CREATININE 0.79 0.81 0.73  --  0.74  CALCIUM  8.2* 8.1* 8.3*  --  8.3*  MG 2.0  --  1.9 1.8  --   PHOS 3.1  --  3.2  --   --    GFR: Estimated Creatinine Clearance: 70.8 mL/min (by C-G formula based on SCr of 0.74 mg/dL). Liver Function Tests: Recent Labs  Lab 05/08/24 9361 05/11/24 0553  AST 39 31  ALT 62* 68*  ALKPHOS 80 105  BILITOT 0.7 0.2  PROT 5.4* 5.5*  ALBUMIN 2.2* 2.9*   No results for input(s): LIPASE, AMYLASE in the last 168 hours. No results for input(s): AMMONIA in the last 168 hours. Coagulation Profile: No results for  input(s): INR, PROTIME in the last 168 hours.  Cardiac Enzymes: No results for input(s): CKTOTAL, CKMB, CKMBINDEX, TROPONINI in the last 168 hours. BNP (last 3 results) No results for input(s): PROBNP in the last 8760 hours. HbA1C: No results for input(s): HGBA1C in  the last 72 hours. CBG: Recent Labs  Lab 05/13/24 1626 05/14/24 0034 05/14/24 0633 05/14/24 1133 05/14/24 1655  GLUCAP 136* 134* 118* 112* 121*   Lipid Profile: No results for input(s): CHOL, HDL, LDLCALC, TRIG, CHOLHDL, LDLDIRECT in the last 72 hours.  Thyroid Function Tests: No results for input(s): TSH, T4TOTAL, FREET4, T3FREE, THYROIDAB in the last 72 hours. Anemia Panel: No results for input(s): VITAMINB12, FOLATE, FERRITIN, TIBC, IRON, RETICCTPCT in the last 72 hours. Most Recent Urinalysis On File:     Component Value Date/Time   COLORURINE STRAW (A) 04/27/2024 2041   APPEARANCEUR CLEAR (A) 04/27/2024 2041   APPEARANCEUR Clear 04/01/2013 1228   LABSPEC 1.017 04/27/2024 2041   LABSPEC 1.014 04/01/2013 1228   PHURINE 6.0 04/27/2024 2041   GLUCOSEU >=500 (A) 04/27/2024 2041   GLUCOSEU Negative 04/01/2013 1228   HGBUR NEGATIVE 04/27/2024 2041   BILIRUBINUR NEGATIVE 04/27/2024 2041   BILIRUBINUR Negative 04/01/2013 1228   KETONESUR 5 (A) 04/27/2024 2041   PROTEINUR 30 (A) 04/27/2024 2041   NITRITE NEGATIVE 04/27/2024 2041   LEUKOCYTESUR TRACE (A) 04/27/2024 2041   LEUKOCYTESUR Negative 04/01/2013 1228   Sepsis Labs: @LABRCNTIP (procalcitonin:4,lacticidven:4) Microbiology: No results found for this or any previous visit (from the past 240 hours).    Radiology Studies last 3 days: DG Chest Port 1 View Result Date: 05/14/2024 CLINICAL DATA:  PICC line placement EXAM: PORTABLE CHEST 1 VIEW COMPARISON:  05/03/2024 FINDINGS: Left-sided PICC line tip overlies the mid to distal SVC. Lungs are clear bilaterally. The cardiopericardial silhouette is within normal limits for size. No acute bony abnormality. IMPRESSION: Left-sided PICC line tip overlies the mid to distal SVC. Electronically Signed   By: Camellia Candle M.D.   On: 05/14/2024 04:55   DG Abd 2 Views Result Date: 05/13/2024 CLINICAL DATA:  Ileus. EXAM: ABDOMEN - 2 VIEW COMPARISON:   05/11/2024 FINDINGS: Gas distended small bowel in the left abdomen is similar to prior. Decreased gas in the transverse colon with residual gas visible in the nondilated splenic flexure, descending colon, and rectum. IVC filter in embolization coils overlie the medial right abdomen. IMPRESSION: Gas distended small bowel in the left abdomen is similar to prior. Gas is seen in the nondilated left colon and rectum. Electronically Signed   By: Camellia Candle M.D.   On: 05/13/2024 07:29   US  EKG SITE RITE Result Date: 05/12/2024 If Site Rite image not attached, placement could not be confirmed due to current cardiac rhythm.  DG Abd 2 Views Result Date: 05/11/2024 CLINICAL DATA:  Abdominal pain. Status post recent laparotomy with bowel resection. EXAM: DG ABDOMEN 2V COMPARISON:  CT 05/03/2024 FINDINGS: Gaseous small bowel in the left abdomen persist, small bowel distention up to 4.4 cm. Enteric sutures noted in the right abdomen. Small volume formed stool in the colon. Cholecystectomy clips. IVC filter in place. Vascular calcifications. Anterior skin staples. Drains on prior CT are not confidently demonstrated by radiograph. IMPRESSION: Persistent gaseous small bowel distention in the left abdomen, small bowel distention up to 4.4 cm. Favor postoperative ileus, although obstruction is also considered in the appropriate clinical setting. Electronically Signed   By: Andrea Gasman M.D.   On: 05/11/2024 12:03       Time spent:  50 min     Vannessa Godown, DO [facility] 05/14/2024, 6:11 PM    Dictation software may have been used to generate the above note. Typos may occur and escape review in typed/dictated notes. Please contact Dr Marsa directly for clarity if needed.  Staff may message me via secure chat in Epic  but this may not receive an immediate response,  please page me for urgent matters!  If 7PM-7AM, please contact night coverage [email or web address]

## 2024-05-14 NOTE — Progress Notes (Signed)
 PHARMACY - TOTAL PARENTERAL NUTRITION CONSULT NOTE   Indication: Small bowel obstruction  Patient Measurements: Height: 5' 10 (177.8 cm) Weight: 82.1 kg (181 lb) IBW/kg (Calculated) : 68.5 TPN AdjBW (KG): 79.6 Body mass index is 25.97 kg/m. Usual Weight: 75.8 kg (02/07/24)  Assessment:  Kylie [name] is a 68yoF that presented with upper abdominal pain. Patient's past medical history notable for dementia, HTN, HLD, sCHF with EF 40-45% (10/08/23), CVA, CKD-3a, depression, DM, PE on apixaban, and s/p right nephrectomy in 2009 for right renal cell carcinoma.  Glucose / Insulin : BG 112-136. (No SSI used in last 24 hours)  Electrolytes: K 4.4, Mag 1.8, Phos 3.2, Corrected Ca 9.2 Renal: Scr 0.80>0.79-->0.74 Hepatic: Alk phos 105, AST 31, ALT 68, total bili 0.2, albumin 2.2 Intake / Output; MIVF: Net since admission: (+) 12.9L GI Imaging: 10/30 CT Angio Impression: Closed loop small bowel obstruction likely secondary to an internal hernia with findings concerning for developing bowel ischemia. Clinical correlation and surgical consult is advised. Status post right nephrectomy. 11/5 CTABD: Dilated small-bowel loops up to 4.3 cm without focal obstruction, consistent with ileus. Small amount of free fluid. 11/13 Abd XR: Persistent small bowel distention suggestive of continued post-op ileus. GI Surgeries / Procedures:  10/31: Exploratory Laparotomy, reduction of internal hernia, small bowel resection with cecectomy (length 110 cm), ileocolic anastomosis  ID Zosyn 10/31 >> 11/7  Central access: yes TPN start date: 04/28/2024  Nutritional Goals: Goal TPN rate is 75 mL/hr (provides 108 g of protein and 2005 kcals per day)  RD Assessment: Estimated Needs Total Energy Estimated Needs: 2000-2300kcal/day Total Protein Estimated Needs: 100-115g/day Total Fluid Estimated Needs: 1.8-2.1L/day  Current Nutrition:  Full liquid 11/7 >> soft foods 11/11 >> clear liquids 11/13  Plan:  Per  surgery, patient having bowel function but not eating much Continue TPN to 75 ml/hr (goal rate) at 1800 Electrolytes in TPN (standard): Na 50mEq/L, K 50mEq/L, Ca 48mEq/L, Mg 18mEq/L, and Phos 15mmol/L Cl:Ac 1:1 (watch) Add standard MVI and trace elements to TPN Give magnesium  sulfate 2 g IV x1 today Thiamine 100mg  IV daily x 7 days per dietician (completed) Continue Sensitive  q6h SSI and adjust as needed  Monitor TPN labs on Mon/Thurs - check Mag in AM  Thank you for involving pharmacy in this patient's care.   Rosamae Rocque A Meer Reindl, PharmD Clinical Pharmacist 05/14/2024 11:23 AM

## 2024-05-14 NOTE — Plan of Care (Signed)

## 2024-05-14 NOTE — Progress Notes (Signed)
 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 She presented on 04/27/2024 from her long-term care facility [facility]) with severe diffuse abdominal pain of unclear duration and hypertensive crisis. CTA chest/abdomen/pelvis revealed a closed-loop small bowel obstruction with evolving ischemia. She received Kcentra for DOAC reversal and underwent emergent exploratory laparotomy on 04/27/2024, which included reduction of an internal hernia, resection of approximately 110 cm of distal small bowel with ileocecectomy, and ileocolic anastomosis.  Today is her 17th day in the hospital. Hospital stay complicated by significant co morbidities and development of postoperative ileus.    Today's Discussion 05/14/2024   Chart review included assessment of vital signs, progress notes, laboratory data, and diagnostic imaging. The most recent KUB from 05/13/2024 was reviewed and continues to favor a persistent ileus, with no significant improvement compared to prior imaging.  Visited patient at bedside today. She is pleasantly confused, chronically-ill appearing, not in any acute distress. She denies pain and discomfort. She reports tolerating liquid diet. She reports passing gas and has had bowel movements over the last 24 hours. EMR shows patient has 2 BM occurrences to support this. She however reports that she is not at her best today, she is feeling sick, but denies nausea. Unable to accurately answer orientation questions due to underlying dementia.   Reviewed the notes from Surgery, patient is to maintain clear liquid today, and potentially advance to full liquid as tolerated. TPN is being continued until such time diet is  advanced and when it is certain that she is able to get enough calories.   Goals of care remain unchanged. They desire for the medical team to continue with current medical treatment, allow time for outcomes, and hope for clinical improvement. We recommend and to which family agrees to add an appetite stimulant once her diet is advanced.   Created space and opportunity for patient to explore thoughts feelings and fears regarding current medical situation.  Patient and her family face treatment option decisions, advanced directive decisions and anticipatory care needs.   Palliative Support Provided.   Objective Assessment: Vital Signs Vitals:   05/14/24 0411 05/14/24 0742  BP: 132/75 123/70  Pulse: 98 91  Resp: 20 16  Temp: 98.1 F (36.7 C) 99.1 F (37.3 C)  SpO2: 100% 100%    Intake/Output Summary (Last 24 hours) at 05/14/2024 9081 Last data filed at 05/14/2024 0600 Gross per 24 hour  Intake 1319.04 ml  Output --  Net 1319.04 ml   Last Weight  Most recent update: 05/14/2024  6:33 AM    Weight  82.1 kg (181 lb)             Physical Exam Constitutional:      General: Frail-looking, she is not in acute distress. Cardiovascular:     Rate and Rhythm: Normal rate and regular rhythm.  Pulmonary:     Effort: Pulmonary effort is normal.     Breath sounds: Normal breath sounds.  Abdominal:     General: Bowel sounds are normal.     Palpations: Abdomen is soft.  Neurological:     Mental Status: She is alert. She is disoriented.  Psychiatric:        Mood and Affect: Mood normal.       SUMMARY  OF RECOMMENDATIONS   Code Status: Maintain Full Code Goal of care is medical stabilization and recovery to the extent this is possible Continue with full scope of care per patient/family's wish/desire. Consider adding an appetite stimulant to help meet caloric requirements when diet is advanced. Continue to provide psycho-social and emotional support to patient and  family Palliative medicine team will continue to follow.   Symptom Management: Per Primary team Palliative medicine is available to assist as needed.    Time Spent: 35 minutes  Detailed review of medical records including labs, imaging and vital signs,  medically appropriate exam, discussed with treatment team, counseling and education to patient, family, & staff, documenting clinical information, coordination of care.   ______________________________________________________________________________________ Kathlyne Bolder NP-C [facility] Palliative Medicine Team Team Cell Phone: [phone] Please utilize secure chat with additional questions, if there is no response within 30 minutes please call the above phone number  Palliative Medicine Team providers are available by phone from 7am to 7pm daily and can be reached through the team cell phone.  Should this patient require assistance outside of these hours, please call the patient's attending physician.

## 2024-05-15 DIAGNOSIS — Z7189 Other specified counseling: Secondary | ICD-10-CM | POA: Diagnosis not present

## 2024-05-15 DIAGNOSIS — Z515 Encounter for palliative care: Secondary | ICD-10-CM | POA: Diagnosis not present

## 2024-05-15 DIAGNOSIS — K559 Vascular disorder of intestine, unspecified: Secondary | ICD-10-CM | POA: Diagnosis not present

## 2024-05-15 LAB — COMPREHENSIVE METABOLIC PANEL WITH GFR
ALT: 22 U/L (ref 0–44)
AST: 14 U/L — ABNORMAL LOW (ref 15–41)
Albumin: 2.6 g/dL — ABNORMAL LOW (ref 3.5–5.0)
Alkaline Phosphatase: 82 U/L (ref 38–126)
Anion gap: 5 (ref 5–15)
BUN: 28 mg/dL — ABNORMAL HIGH (ref 8–23)
CO2: 21 mmol/L — ABNORMAL LOW (ref 22–32)
Calcium: 8.4 mg/dL — ABNORMAL LOW (ref 8.9–10.3)
Chloride: 114 mmol/L — ABNORMAL HIGH (ref 98–111)
Creatinine, Ser: 0.8 mg/dL (ref 0.44–1.00)
GFR, Estimated: >60 mL/min (ref >60)
Glucose, Bld: 108 mg/dL — ABNORMAL HIGH (ref 70–99)
Potassium: 4.8 mmol/L (ref 3.5–5.1)
Sodium: 140 mmol/L (ref 135–145)
Total Bilirubin: 0.2 mg/dL (ref 0.0–1.2)
Total Protein: 5 g/dL — ABNORMAL LOW (ref 6.5–8.1)

## 2024-05-15 LAB — GLUCOSE, CAPILLARY
Glucose-Capillary: 111 mg/dL — ABNORMAL HIGH (ref 70–99)
Glucose-Capillary: 117 mg/dL — ABNORMAL HIGH (ref 70–99)
Glucose-Capillary: 125 mg/dL — ABNORMAL HIGH (ref 70–99)
Glucose-Capillary: 126 mg/dL — ABNORMAL HIGH (ref 70–99)

## 2024-05-15 LAB — MAGNESIUM: Magnesium: 2 mg/dL (ref 1.7–2.4)

## 2024-05-15 LAB — TRIGLYCERIDES: Triglycerides: 82 mg/dL (ref <150)

## 2024-05-15 LAB — PHOSPHORUS: Phosphorus: 3.7 mg/dL (ref 2.5–4.6)

## 2024-05-15 MED ORDER — SENNOSIDES-DOCUSATE SODIUM 8.6-50 MG PO TABS
2.0000 | ORAL_TABLET | Freq: Every day | ORAL | Status: DC
  Administered 2024-05-15 – 2024-05-18 (×3): 2 via ORAL
  Filled 2024-05-15 (×3): qty 2

## 2024-05-15 MED ORDER — ACETAMINOPHEN 500 MG PO TABS
1000.0000 mg | ORAL_TABLET | Freq: Three times a day (TID) | ORAL | Status: DC
  Administered 2024-05-15 – 2024-05-19 (×12): 1000 mg via ORAL
  Filled 2024-05-15 (×12): qty 2

## 2024-05-15 MED ORDER — MEGESTROL ACETATE 20 MG PO TABS
40.0000 mg | ORAL_TABLET | Freq: Every day | ORAL | Status: DC
  Administered 2024-05-15 – 2024-05-19 (×5): 40 mg via ORAL
  Filled 2024-05-15 (×5): qty 2

## 2024-05-15 MED ORDER — TRACE MINERALS CU-MN-SE-ZN 300-55-60-3000 MCG/ML IV SOLN
INTRAVENOUS | Status: AC
  Filled 2024-05-15: qty 720

## 2024-05-15 MED ORDER — ENSURE PLUS HIGH PROTEIN PO LIQD
237.0000 mL | Freq: Three times a day (TID) | ORAL | Status: DC
  Administered 2024-05-15 – 2024-05-18 (×9): 237 mL via ORAL

## 2024-05-15 NOTE — Progress Notes (Addendum)
 Daily Progress Note   Patient Name: Kylie [name]       Date: 05/15/2024 DOB: [birth year]  Age: 68 y.o. MRN#: [id number] Attending Physician: Marsa Edelman, DO Primary Care Physician: [facility], [facility] Admit Date: 04/27/2024  Reason for Consultation/Follow-up: Establishing goals of care  Subjective: Notes and labs reviewed, in to see patient.  Sitter is at bedside.  Patient is currently alert, resting in bed at this time.  Lights are out and the room is dark.  She appears content watching television.  She denies complaint.  Sitter states she is eating and drinking bites and sips before stating she is full.  Patient states that she is not passing flatus.  OT and to work with patient.  Discussed importance of sleep-wake cycles with patient and sitter.  Length of Stay: 18  Current Medications: Scheduled Meds:   sodium chloride    Intravenous Once   sodium chloride    Intravenous Once   acetaminophen   1,000 mg Oral Q6H   amLODipine   10 mg Oral Daily   Chlorhexidine  Gluconate Cloth  6 each Topical Daily   cloNIDine   0.1 mg Transdermal Weekly   feeding supplement  237 mL Oral TID BM   insulin  aspart  0-6 Units Subcutaneous Q6H   irbesartan  150 mg Oral Daily   levothyroxine   100 mcg Oral q morning   melatonin  5 mg Oral QHS   polyethylene glycol  17 g Oral Daily   sodium chloride  flush  10-40 mL Intracatheter Q12H    Continuous Infusions:  TPN ADULT (ION) 75 mL/hr at 05/14/24 1848   TPN ADULT (ION)      PRN Meds: ketorolac , LORazepam, OLANZapine  zydis, mouth rinse, oxyCODONE, sodium chloride  flush, ziprasidone  Physical Exam Pulmonary:     Effort: Pulmonary effort is normal.  Neurological:     Mental Status: She is alert.             Vital Signs: BP 120/68 (BP  Location: Right Arm)   Pulse 82   Temp 98.4 F (36.9 C) (Oral)   Resp 16   Ht 5' 10 (1.778 m)   Wt 84.1 kg   SpO2 100%   BMI 26.60 kg/m  SpO2: SpO2: 100 % O2 Device: O2 Device: Room Air O2 Flow Rate: O2 Flow Rate (L/min): 6 L/min  Intake/output summary:  Intake/Output Summary (Last 24 hours) at 05/15/2024 1317 Last data filed at 05/15/2024 0944 Gross per 24 hour  Intake 495.19 ml  Output 810 ml  Net -314.81 ml   LBM: Last BM Date : 05/13/24 Baseline Weight: Weight: 79.5 kg Most recent weight: Weight: 84.1 kg   Patient Active Problem List   Diagnosis Date Noted   Malnutrition of moderate degree 04/29/2024   Ischemic bowel disease 04/27/2024   Closed loop obstruction of intestine (HCC) 04/27/2024   Chronic combined systolic and diastolic CHF (congestive heart failure) (HCC) 04/27/2024   Leukocytosis 04/27/2024   HLD (hyperlipidemia) 04/27/2024   CAD (coronary artery disease) 04/27/2024   Depression 04/27/2024   Chronic kidney disease, stage 3a (HCC) 04/27/2024   Hypertensive urgency 04/27/2024   QT prolongation 04/27/2024   Hypokalemia 10/07/2023   Sepsis, severe, due to pneumonia (HCC) 10/06/2023   RSV (respiratory syncytial virus pneumonia) 10/06/2023   Acute respiratory failure with hypoxia (HCC) 10/06/2023   Rhabdomyolysis, traumatic 08/03/2023   Hypoglycemia 08/03/2023   S/p R nephrectomy for RCC 08/03/2023   History of pulmonary embolism 08/03/2023   History of CVA (cerebrovascular accident) 08/03/2023   Dementia (HCC)    Chronic anticoagulation due to history of pulmonary embolism 05/25/2017   CVA (cerebral vascular accident) (HCC) 05/25/2017   Hyperlipidemia 05/25/2017   Obesity (BMI 30.0-34.9) 10/22/2016   Type 2 diabetes mellitus without complication, without long-term current use of insulin  (HCC) 11/14/2014   Acquired hypothyroidism 02/19/2014   Allergic rhinitis 02/19/2014   Neuropathy 02/19/2014   Primary osteoarthritis of both knees 02/19/2014    Severe episode of recurrent major depressive disorder, without psychotic features (HCC) 02/19/2014   Vitamin B 12 deficiency 02/19/2014   Vitamin D  deficiency 02/19/2014   PE (pulmonary thromboembolism) (HCC) 10/03/2012   Malignant neoplasm of kidney (HCC) 03/21/2012   Gallstones without obstruction of gallbladder 05/08/2009   Sleep apnea 07/05/2000   Essential hypertension 12/06/1996    Palliative Care Assessment & Plan    Recommendations/Plan: Continue full code and full scope  Code Status:    Code Status Orders  (From admission, onward)           Start     Ordered   04/27/24 2311  Full code  Continuous       Question:  By:  Answer:  Consent: discussion documented in EHR   04/27/24 2311           Code Status History     Date Active Date Inactive Code Status Order ID Comments User Context   10/06/2023 2250 10/11/2023 1912 Full Code [id number]  Cleatus Delayne GAILS, MD ED   08/03/2023 0403 08/05/2023 2021 Full Code [id number]  Cleatus Delayne GAILS, MD ED      Advance Directive Documentation    Flowsheet Row Most Recent Value  Type of Advance Directive Healthcare Power of Attorney  Pre-existing out of facility DNR order (yellow form or pink MOST form) --  MOST Form in Place? --    Camelia Lewis, NP  Please contact Palliative Medicine Team phone at [phone] for questions and concerns.

## 2024-05-15 NOTE — Progress Notes (Signed)
 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 Complaints of diffuse severe abdominal pain of unknown duration and hypertensive crisis. CTA C/A/P showed closed-loop SBO with developing ischemia. She received Kcentra for DOAC reversal and underwent emergent ex lap on 04/27/2024 with reduction of internal hernia, resection of approximately 110 cm distal small bowel with ileocecectomy, and ileocolic anastomosis. Status post transfusion of 1 unit pRBC on 11/3 for Hgb 6.4. Course complicated by postop ileus as of 11/05. IV Zosyn was stopped on 11/07 after 8 days JP drain removed on 11/08. Had hematochezia x 7 episodes on 11/9-11/10 AM with Hgb drop to nadir of 5.4. Transfused 2 units pRBC. Surgery aware, suspect bleeding at anastomosis site, inaccessible endoscopically. Has been on TPN and recently started on po diet but her intake has been poor. Has pulled PICC line few times now. Persistent Ileus and abd pain worse 11/13 and persistent into 11/16, have had to deescalate diet back to clears. 11/17 trial advance to full liquids. Pt still not moving around much and not taking much po      Consultants:  General Surgery   Procedures/Surgeries: 04/27/24: exploratory laparotomy with small bowel resection, ileocecectomy, ileocolonic anastomosis and reduction of internal hernia      ASSESSMENT & PLAN:   # Ischemic bowel status post ex lap with SB resection plus ileocecectomy and ileocolic anastomosis (10/30) # Course complicated by post-op ileus (11/05) # Persistent/recurrent  ileus (KUB 11/13, 11/15) General surgery following  remains on TPN restart on CLD 11/13, advanced to FLD 11/17, continue advancing as able  Question for feeding tube but given dementia she is a poor candidate for this - I have discussed w/ family that if she is not able to meet her caloric needs will need to discuss further but given has pulled PICC multiple times she will surely do same with tube.   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 Not oriented to situation.  Complicates her care - has pulled PICC several times which is needed for TPN at this point. She's needing sitter. Surely will pull a feeding tube. Will discuss further with family. If dementia is limiting her ability to receive safe care / if treatments are causing distress, would deescalate to focus on treating as able and prioritizing QOL   # GOC Patient has no insight into the severity of her medical condition. Has no capacity to  make her own medical decisions Decision-maker(s): Pt's cousin [name] is usually at bedside - he is her financial POA and pt's brother Koren is her HCPOA. If we are unable to reach Koren, he assigns [employer] as education officer, environmental and he asks we do NOT involve patient's children so I have inactivated them in her contacts per his request.  goal to wean off TPN and  advance diet - if not able to accomplish this such that pt is meeting nutritional requirements, then would need to talk about alternative plan and likely hospice Not a good candidate for feeding tube  D/w [name] and Koren (by phone in the room) at bedside 11/13 - confirmed for FULL CODE, I did advise that in situations of severe weakness/malnourishment, I strongly recommend against CPR. I do not believe patient is actively dying at this point, my greatest concern is if she is not improving her po intake then she will certainly continue to decline. Time will tell. Koren and [name] state that when she was lucid years ago she mentioned ~NOT~ wanting to be a DNR so they are going by her wishes, but are open to revisit the conversation if she is getting worse / has terminal illness.  See above re dementia     overweight based on BMI: Body mass index is 27.14 kg/m.SABRA Significantly low or high BMI is associated with higher medical risk.  Underweight - under 18  overweight - 25 to 29 obese - 30 or more Class 1 obesity: BMI of 30.0 to 34 Class 2 obesity: BMI of 35.0 to 39 Class 3 obesity: BMI of 40.0 to 49 Super Morbid Obesity: BMI 50-59 Super-super Morbid Obesity: BMI 60+ Healthy nutrition and physical activity advised as adjunct to other disease management and risk reduction treatments     DVT prophylaxis: holding d/t bleed risk IV fluids: no continuous IV fluids  Nutrition: TPN and advancing po as tolerate but intake has been poor as above. CLD for now  Unumprovident / other devices: PICC   Code Status: FULL CODE ACP documentation reviewed:  none on file in [facility]  TOC needs: TBD, potential hospice / back to SNF w/ PT/OT if able  Medical barriers to dispo: TPN. Expected medical readiness for discharge pending po intake / GOC.              Subjective / Brief ROS:  She is not oriented but she is pleasant. She reports abd pain same as yesterday    Family Communication: plan to  discuss w/ [name] at bedside today he states he will be here around 15:00-15:30    Objective Findings:  Vitals:   05/14/24 1944 05/15/24 0432 05/15/24 0657 05/15/24 0800  BP: 131/63 (!) 111/59  120/68  Pulse: 83 78  82  Resp: 16 16  16   Temp: 99.5 F (37.5 C) 98.2 F (36.8 C)  98.4 F (36.9 C)  TempSrc:  Oral  Oral  SpO2: 100% 100%  100%  Weight:   84.1 kg   Height:        Intake/Output Summary (Last 24 hours) at 05/15/2024 1356 Last data filed at 05/15/2024 0944 Gross per 24 hour  Intake 495.19 ml  Output 810 ml  Net -314.81 ml   Filed Weights   05/13/24 0409 05/14/24 0500 05/15/24 0657  Weight: 81.5 kg 82.1 kg 84.1 kg    Examination:  Physical Exam Constitutional:      General: She is not in acute distress. Cardiovascular:     Rate and  Rhythm: Normal rate and regular rhythm.     Heart sounds: Murmur heard.  Pulmonary:     Effort: Pulmonary effort is normal.     Breath sounds: Normal breath sounds.  Abdominal:     General: Bowel sounds are normal.     Palpations: Abdomen is soft.     Tenderness: There is generalized abdominal tenderness.  Neurological:     Mental Status: She is alert. She is disoriented.  Psychiatric:        Mood and Affect: Mood normal.        Behavior: Behavior normal.          Scheduled Medications:   sodium chloride    Intravenous Once   sodium chloride    Intravenous Once   acetaminophen   1,000 mg Oral Q6H   amLODipine   10 mg Oral Daily   Chlorhexidine  Gluconate Cloth  6 each Topical Daily   cloNIDine   0.1 mg Transdermal Weekly   feeding supplement  237 mL Oral TID BM   insulin  aspart  0-6 Units Subcutaneous Q6H   irbesartan  150 mg Oral Daily   levothyroxine   100 mcg Oral q morning   melatonin  5 mg Oral QHS   polyethylene glycol  17 g Oral Daily   sodium chloride  flush  10-40 mL Intracatheter Q12H    Continuous Infusions:  TPN ADULT (ION) 75 mL/hr at 05/14/24 1848   TPN ADULT (ION)      PRN Medications:  ketorolac ,  LORazepam, OLANZapine  zydis, mouth rinse, oxyCODONE, sodium chloride  flush, ziprasidone  Antimicrobials from admission:  Anti-infectives (From admission, onward)    Start     Dose/Rate Route Frequency Ordered Stop   04/28/24 0600  piperacillin-tazobactam (ZOSYN) IVPB 3.375 g  Status:  Discontinued        3.375 g 12.5 mL/hr over 240 Minutes Intravenous Every 8 hours 04/28/24 0256 05/05/24 1340   04/27/24 2300  cefoTEtan (CEFOTAN) 2 g in sodium chloride  0.9 % 100 mL IVPB        2 g 200 mL/hr over 30 Minutes Intravenous On call to O.R. 04/27/24 2255 04/28/24 1319           Data Reviewed:  I have personally reviewed the following...  CBC: Recent Labs  Lab 05/09/24 0536 05/10/24 0453 05/11/24 0553 05/12/24 0336 05/13/24 0630  WBC 13.8* 13.8* 12.6* 10.2 10.8*  HGB 7.5* 7.7* 8.0* 7.7* 8.0*  HCT 22.5* 23.0* 24.5* 23.3* 24.8*  MCV 96.2 96.2 97.2 97.5 99.6  PLT 280 278 302 317 354   Basic Metabolic Panel: Recent Labs  Lab 05/11/24 0553 05/12/24 0336 05/13/24 0630 05/15/24 0401  NA 138  --  140 140  K 4.2  --  4.4 4.8  CL 108  --  113* 114*  CO2 22  --  22 21*  GLUCOSE 111*  --  118* 108*  BUN 19  --  22 28*  CREATININE 0.73  --  0.74 0.80  CALCIUM  8.3*  --  8.3* 8.4*  MG 1.9 1.8  --  2.0  PHOS 3.2  --   --  3.7   GFR: Estimated Creatinine Clearance: 77.2 mL/min (by C-G formula based on SCr of 0.8 mg/dL). Liver Function Tests: Recent Labs  Lab 05/11/24 0553 05/15/24 0401  AST 31 14*  ALT 68* 22  ALKPHOS 105 82  BILITOT 0.2 0.2  PROT 5.5* 5.0*  ALBUMIN 2.9* 2.6*   No results for input(s): LIPASE, AMYLASE in the last 168 hours. No results for input(s):  AMMONIA in the last 168 hours. Coagulation Profile: No results for input(s): INR, PROTIME in the last 168 hours.  Cardiac Enzymes: No results for input(s): CKTOTAL, CKMB, CKMBINDEX, TROPONINI in the last 168 hours. BNP (last 3 results) No results for input(s): PROBNP in the last 8760  hours. HbA1C: No results for input(s): HGBA1C in the last 72 hours. CBG: Recent Labs  Lab 05/14/24 1133 05/14/24 1655 05/15/24 0047 05/15/24 0644 05/15/24 1143  GLUCAP 112* 121* 117* 125* 111*   Lipid Profile: Recent Labs    05/15/24 0401  TRIG 82    Thyroid Function Tests: No results for input(s): TSH, T4TOTAL, FREET4, T3FREE, THYROIDAB in the last 72 hours. Anemia Panel: No results for input(s): VITAMINB12, FOLATE, FERRITIN, TIBC, IRON, RETICCTPCT in the last 72 hours. Most Recent Urinalysis On File:     Component Value Date/Time   COLORURINE STRAW (A) 04/27/2024 2041   APPEARANCEUR CLEAR (A) 04/27/2024 2041   APPEARANCEUR Clear 04/01/2013 1228   LABSPEC 1.017 04/27/2024 2041   LABSPEC 1.014 04/01/2013 1228   PHURINE 6.0 04/27/2024 2041   GLUCOSEU >=500 (A) 04/27/2024 2041   GLUCOSEU Negative 04/01/2013 1228   HGBUR NEGATIVE 04/27/2024 2041   BILIRUBINUR NEGATIVE 04/27/2024 2041   BILIRUBINUR Negative 04/01/2013 1228   KETONESUR 5 (A) 04/27/2024 2041   PROTEINUR 30 (A) 04/27/2024 2041   NITRITE NEGATIVE 04/27/2024 2041   LEUKOCYTESUR TRACE (A) 04/27/2024 2041   LEUKOCYTESUR Negative 04/01/2013 1228   Sepsis Labs: @LABRCNTIP (procalcitonin:4,lacticidven:4) Microbiology: No results found for this or any previous visit (from the past 240 hours).    Radiology Studies last 3 days: DG Chest Port 1 View Result Date: 05/14/2024 CLINICAL DATA:  PICC line placement EXAM: PORTABLE CHEST 1 VIEW COMPARISON:  05/03/2024 FINDINGS: Left-sided PICC line tip overlies the mid to distal SVC. Lungs are clear bilaterally. The cardiopericardial silhouette is within normal limits for size. No acute bony abnormality. IMPRESSION: Left-sided PICC line tip overlies the mid to distal SVC. Electronically Signed   By: Camellia Candle M.D.   On: 05/14/2024 04:55   DG Abd 2 Views Result Date: 05/13/2024 CLINICAL DATA:  Ileus. EXAM: ABDOMEN - 2 VIEW COMPARISON:   05/11/2024 FINDINGS: Gas distended small bowel in the left abdomen is similar to prior. Decreased gas in the transverse colon with residual gas visible in the nondilated splenic flexure, descending colon, and rectum. IVC filter in embolization coils overlie the medial right abdomen. IMPRESSION: Gas distended small bowel in the left abdomen is similar to prior. Gas is seen in the nondilated left colon and rectum. Electronically Signed   By: Camellia Candle M.D.   On: 05/13/2024 07:29   US  EKG SITE RITE Result Date: 05/12/2024 If Site Rite image not attached, placement could not be confirmed due to current cardiac rhythm.      Time spent: 50 min     Azile Minardi, DO [facility] 05/15/2024, 1:56 PM    Dictation software may have been used to generate the above note. Typos may occur and escape review in typed/dictated notes. Please contact Dr Marsa directly for clarity if needed.  Staff may message me via secure chat in Epic  but this may not receive an immediate response,  please page me for urgent matters!  If 7PM-7AM, please contact night coverage [email or web address]

## 2024-05-15 NOTE — Progress Notes (Signed)
 PHARMACY - TOTAL PARENTERAL NUTRITION CONSULT NOTE   Indication: Small bowel obstruction  Patient Measurements: Height: 5' 10 (177.8 cm) Weight: 84.1 kg (185 lb 6.5 oz) IBW/kg (Calculated) : 68.5 TPN AdjBW (KG): 79.6 Body mass index is 26.6 kg/m. Usual Weight: 75.8 kg (02/07/24)  Assessment:  Kylie [name] is a 68yoF that presented with upper abdominal pain. Patient's past medical history notable for dementia, HTN, HLD, sCHF with EF 40-45% (10/08/23), CVA, CKD-3a, depression, DM, PE on apixaban, and s/p right nephrectomy in 2009 for right renal cell carcinoma.  Glucose / Insulin : BG 108-125. (No SSI used in last 24 hours)  Electrolytes: K 4.8, Mag 2.0, Phos 3.7, Corrected Ca 9.5 Renal: Scr 0.79>0.74>0.8 Hepatic: Alk phos 82, AST 14, ALT 22, total bili 0.2, albumin 2.6 Intake / Output; MIVF: Net since admission: (+) 11.1L GI Imaging: 10/30 CT Angio Impression: Closed loop small bowel obstruction likely secondary to an internal hernia with findings concerning for developing bowel ischemia. Clinical correlation and surgical consult is advised. Status post right nephrectomy. 11/5 CTABD: Dilated small-bowel loops up to 4.3 cm without focal obstruction, consistent with ileus. Small amount of free fluid. 11/13 Abd XR: Persistent small bowel distention suggestive of continued post-op ileus. GI Surgeries / Procedures:  10/31: Exploratory Laparotomy, reduction of internal hernia, small bowel resection with cecectomy (length 110 cm), ileocolic anastomosis  ID Zosyn 10/31 >> 11/7  Central access: yes TPN start date: 04/28/2024  Nutritional Goals: Goal TPN rate is 75 mL/hr (provides 108 g of protein and 2005 kcals per day)  RD Assessment: Estimated Needs Total Energy Estimated Needs: 2000-2300kcal/day Total Protein Estimated Needs: 100-115g/day Total Fluid Estimated Needs: 1.8-2.1L/day  Current Nutrition:  Full liquid 11/7 >> soft foods 11/11 >> clear liquids 11/13 >> full liquids  11/17  Plan:  Per surgery, patient having bowel function but not eating much Continue TPN to 75 ml/hr (goal rate) at 1800 Electrolytes in TPN (standard): Na 50mEq/L, K 50mEq/L, Ca 51mEq/L, Mg 44mEq/L, and Phos 15mmol/L Cl:Ac 1:1 (watch) Add standard MVI and trace elements to TPN Thiamine 100mg  IV daily x 7 days per dietician (completed) Continue Sensitive  q6h SSI and adjust as needed  Monitor TPN labs on Mon/Thurs - check potassium in AM  Thank you for involving pharmacy in this patient's care.   Damien Napoleon, PharmD Clinical Pharmacist 05/15/2024 8:55 AM

## 2024-05-15 NOTE — Plan of Care (Signed)

## 2024-05-15 NOTE — Progress Notes (Signed)
 Physical Therapy Treatment Patient Details Name: Kylie [name] MRN: [id number] DOB: Mar 01, 1956 Today's Date: 05/15/2024   History of Present Illness Pt is a 68 y/o F admitted on 04/27/24 after presenting with c/o abdominal pain. Imaging showed ischemic bowel disease. Pt is s/p ex lap & small bowel resection with cecectomy on 04/27/24. PMH: dementia, HTN, HLD, sCHF, CVA, CKD 3A, depression, DM, PE on xarelto , s/p R nephrectomy (2009) 2/2 renal cell carcinoma    PT Comments  Pt assisted this pm with functional mobility in room to/from bathroom. Continues to c/o B knee pain from what appears to be significant arthritis with bilateral varus in both knees. No real c/o abdominal discomfort. Pt able to tolerate gait training with heavy reliance on RW due to knee pain. Good progression with transfers and bed mobility requiring less additional assist than previous week. Continue to recommend STR at d/c.    If plan is discharge home, recommend the following: A little help with walking and/or transfers;A little help with bathing/dressing/bathroom   Can travel by private vehicle     Yes  Equipment Recommendations  None recommended by PT (TBD at next level of care)    Recommendations for Other Services       Precautions / Restrictions Precautions Precautions: Fall Recall of Precautions/Restrictions: Impaired Precaution/Restrictions Comments: log roll for comfort 2/2 abdominal incision Restrictions Weight Bearing Restrictions Per Provider Order: No     Mobility  Bed Mobility Overal bed mobility: Needs Assistance Bed Mobility: Sit to Supine       Sit to supine: Supervision   General bed mobility comments:  (Pt able to lift legs up onto bed without difficulty)    Transfers Overall transfer level: Needs assistance Equipment used: Rolling Becka (2 wheels) Transfers: Sit to/from Stand Sit to Stand: Contact guard assist (Definite use of side rail to raise from low toilet)            General transfer comment: cues for hand placement and safety    Ambulation/Gait Ambulation/Gait assistance: Contact guard assist Gait Distance (Feet): 30 Feet Assistive device: Rolling Calder (2 wheels) Gait Pattern/deviations: Step-through pattern, Trunk flexed, Knee hyperextension - right, Knee hyperextension - left Gait velocity: decreased     General Gait Details:  (Very slow cadence, wide base of support, B knee varus and locked in extension for pain control/buckling)   Stairs             Wheelchair Mobility     Tilt Bed    Modified Rankin (Stroke Patients Only)       Balance Overall balance assessment: Needs assistance Sitting-balance support: No upper extremity supported, Feet supported Sitting balance-Leahy Scale: Good     Standing balance support: Bilateral upper extremity supported, During functional activity, Reliant on assistive device for balance Standing balance-Leahy Scale: Fair Standing balance comment:  (Heavy reliance on RW during gait)                            Communication Communication Communication: Impaired Factors Affecting Communication: Reduced clarity of speech  Cognition Arousal: Alert Behavior During Therapy: WFL for tasks assessed/performed   PT - Cognitive impairments: History of cognitive impairments                       PT - Cognition Comments: Pt is alert and able to follow commands however is disoriented x 3. Only truely oriuented to self. Pleasantly confused Following commands: Impaired Following commands  impaired: Follows one step commands with increased time, Follows one step commands inconsistently    Cueing Cueing Techniques: Verbal cues, Tactile cues  Exercises Other Exercises Other Exercises: Edu on importance of sitting up in chair and getting OOB daily.    General Comments        Pertinent Vitals/Pain Pain Assessment Pain Assessment: 0-10 Pain Score: 10-Worst pain ever Pain  Location:  (B knees) Pain Descriptors / Indicators: Aching, Discomfort, Grimacing, Moaning Pain Intervention(s): Limited activity within patient's tolerance    Home Living                          Prior Function            PT Goals (current goals can now be found in the care plan section) Acute Rehab PT Goals Patient Stated Goal: decreased pain Progress towards PT goals: Progressing toward goals    Frequency    Min 2X/week      PT Plan      Co-evaluation              AM-PAC PT 6 Clicks Mobility   Outcome Measure  Help needed turning from your back to your side while in a flat bed without using bedrails?: A Little Help needed moving from lying on your back to sitting on the side of a flat bed without using bedrails?: A Little Help needed moving to and from a bed to a chair (including a wheelchair)?: A Little Help needed standing up from a chair using your arms (e.g., wheelchair or bedside chair)?: A Little Help needed to walk in hospital room?: A Little Help needed climbing 3-5 steps with a railing? : A Lot 6 Click Score: 17    End of Session Equipment Utilized During Treatment: Gait belt Activity Tolerance: Patient tolerated treatment well Patient left: in bed;with call bell/phone within reach;with chair alarm set;with nursing/sitter in room Nurse Communication: Mobility status PT Visit Diagnosis: Muscle weakness (generalized) (M62.81);Unsteadiness on feet (R26.81);Pain Pain - part of body: Knee     Time: 8449-8395 PT Time Calculation (min) (ACUTE ONLY): 14 min  Charges:    $Therapeutic Activity: 8-22 mins PT General Charges $$ ACUTE PT VISIT: 1 Visit                    Darice Bohr, PTA  Darice JAYSON Bohr 05/15/2024, 5:06 PM

## 2024-05-15 NOTE — Progress Notes (Signed)
 Nutrition Follow-up  DOCUMENTATION CODES:   Non-severe (moderate) malnutrition in context of chronic illness  INTERVENTION:   -Continue TPN management per pharmacy (increase to full rate on 05/12/24) -Monitor Mg, K, and Phos and replete as needed secondary to high refeeding risk -Continue daily weights -Continue full diet per general surgery; RD will follow for diet advancement and adjust supplement regimen as appropriate  -Continue feeding assistance with meals  -D/c Boost Breeze po TID, each supplement provides 250 kcal and 9 grams of protein  -Ensure Plus High Protein po TID, each supplement provides 350 kcal and 20 grams of protein  -Magic cup TID with meals, each supplement provides 290 kcal and 9 grams of protein  -If PEG is desired, recommend:    Initiate Osmolite 1.5 @ 20 ml/hr and increase by 10 ml every 12 hours to goal rate of 50 ml/hr.    60 ml Prosource TF20 BID   Tube feeding regimen provides 1960 kcal (100% of needs), 115 grams of protein, and 914 ml of H2O.     NUTRITION DIAGNOSIS:   Moderate Malnutrition related to chronic illness as evidenced by moderate fat depletion, moderate muscle depletion.  Ongoing  GOAL:   Patient will meet greater than or equal to 90% of their needs  Met with TPN  MONITOR:   Diet advancement, Labs, Weight trends, Skin, I & O's, Other (Comment) (TPN)  REASON FOR ASSESSMENT:   Consult New TPN/TNA  ASSESSMENT:   68 y.o. female with h/o dementia, HTN, HLD, CHF, CVA, CKD-3a, depression, DM, PE on Xarelto , RCC s/p right nephrectomy (2009), hypothryoidism, CAD and OSA who is admitted with SBO secondary to Internal hernia resulting in small bowel ischemia and necrosis now s/p exploratory laparotomy, reduction of internal hernia, small bowel resection with cecectomy (~110cm ileum along with IC valve) and ileocolic anastomosis 10/31.  10/31- s/p s/p exploratory laparotomy with small bowel resection, ileocecectomy, ileocolonic  anastomosis and reduction of internal hernia, PICC placed, TPN initiated 11/1- NGT removed by patient, pulled out PICC line 11/2- PICC line replaced 11/3- IV thiamine ordered 11/6- advanced to clear liquid diet 11/7- advanced to full liquid diet, penrose drain removed 11/11- advanced to soft diet, TPN at half rate  11/13- downgraded to clear liquid diet, calorie count completed (meeting 4% of needs) 11/14- TPN advanced to full rate. Patient refused staple removal, PICC replaced 11/15- KUB consistent with ileus 11/17- advanced to full liquid diet  Reviewed I/O's: -235 ml x 24 hours and +11.4 L since 05/01/24  UOP: 610 ml x 24 hours   Patient sleeping soundly at time of visit. RD did not arouse at this time. Noted that patient has been refusing care.   Patient just advanced to full liquid diet. Noted meal completions documented at 10-90%.   Patient remains on full rate of TPN secondary to poor oral intake. TPN infusing at 75 ml/hr, which provides 2005 kcals and 108 grams protein, which provides 100% of estimated kcal and protein needs.   Palliative care following for goals of care discussions. Unless TPN is continued of PEG is pursued, there is nothing further that RD can offer from a nutritional standpoint. RD acknowledges that neither TPN nor TF would be a great option, as TPN would complicate hospital discharge (patient resides in a SNF) and RD would be hesitant to recommend PEG due to history of dementia.   Weight has been stable over the past week.   Medications reviewed and include miralax .  Labs reviewed: CBGS: 112-136 (inpatient orders  for glycemic control are 0-6 units insulin  aspart every 6 hours).    Diet Order:   Diet Order             Diet full liquid Room service appropriate? Yes; Fluid consistency: Thin  Diet effective now                   EDUCATION NEEDS:   Not appropriate for education at this time  Skin:  Skin Assessment: Skin Integrity Issues: Skin  Integrity Issues:: Incisions Incisions: closed abdomen  Last BM:  05/14/24 (type 6)  Height:   Ht Readings from Last 1 Encounters:  04/28/24 5' 10 (1.778 m)    Weight:   Wt Readings from Last 1 Encounters:  05/15/24 84.1 kg    Ideal Body Weight:  68.1 kg  BMI:  Body mass index is 26.6 kg/m.  Estimated Nutritional Needs:   Kcal:  2000-2300kcal/day  Protein:  100-115g/day  Fluid:  1.8-2.1L/day    Margery ORN, RD, LDN, CDCES Registered Dietitian III Certified Diabetes Care and Education Specialist If unable to reach this RD, please use RD Inpatient group chat on secure chat between hours of 8am-4 pm daily

## 2024-05-15 NOTE — Progress Notes (Signed)
 Occupational Therapy Treatment Patient Details Name: Kylie [name] MRN: [id number] DOB: Aug 10, 1955 Today's Date: 05/15/2024   History of present illness Pt is a 68 y/o F admitted on 04/27/24 after presenting with c/o abdominal pain. Imaging showed ischemic bowel disease. Pt is s/p ex lap & small bowel resection with cecectomy on 04/27/24. PMH: dementia, HTN, HLD, sCHF, CVA, CKD 3A, depression, DM, PE on xarelto , s/p R nephrectomy (2009) 2/2 renal cell carcinoma   OT comments  Pt is supine in bed on arrival. Easily arousable and agreeable to OT session. She continues to complain of RLE and abdominal pain especially during movement. Pt performed bed mobility via log roll technique with CGA using bed features. Pt required CGA for STS from EOB to RW with cues for hand placement and all aspects of toileting during session after small BM smear in bathroom. Standing hand hygiene performed with CGA, cues for task performance d/t dementia. Ambulated with slow pace and safety cues intermittently using RW with CGA. Sitter present and pt left in recliner with all needs in place and will cont to require skilled acute OT services to maximize her safety and IND to return to PLOF.       If plan is discharge home, recommend the following:  A lot of help with bathing/dressing/bathroom;Supervision due to cognitive status;A little help with bathing/dressing/bathroom;A little help with walking and/or transfers   Equipment Recommendations  Other (comment) (defer to next venue)    Recommendations for Other Services      Precautions / Restrictions Precautions Precautions: Fall Recall of Precautions/Restrictions: Impaired Precaution/Restrictions Comments: log roll for comfort 2/2 abdominal incision Restrictions Weight Bearing Restrictions Per Provider Order: No       Mobility Bed Mobility Overal bed mobility: Needs Assistance Bed Mobility: Rolling, Sidelying to Sit Rolling: Contact guard  assist Sidelying to sit: Contact guard assist       General bed mobility comments: cues for log roll    Transfers Overall transfer level: Needs assistance Equipment used: Rolling [place] (2 wheels) Transfers: Sit to/from Stand Sit to Stand: From elevated surface, Contact guard assist           General transfer comment: cues for hand placement and safety     Balance Overall balance assessment: Needs assistance Sitting-balance support: No upper extremity supported, Feet supported Sitting balance-Leahy Scale: Good     Standing balance support: During functional activity, No upper extremity supported Standing balance-Leahy Scale: Fair Standing balance comment: during standing peri-care                           ADL either performed or assessed with clinical judgement   ADL Overall ADL's : Needs assistance/impaired     Grooming: Wash/dry hands;Standing;Contact guard assist;Cueing for sequencing Grooming Details (indicate cue type and reason): cues for sequencing/task performance while standing at sink in bathroom                 Toilet Transfer: Contact guard assist;Regular Toilet;Rolling Tangeman (2 wheels) Toilet Transfer Details (indicate cue type and reason): cues for safety and technique Toileting- Clothing Manipulation and Hygiene: Contact guard assist;Sit to/from stand Toileting - Clothing Manipulation Details (indicate cue type and reason): after cont BM on toilet in standing with cues for sequencing     Functional mobility during ADLs: Minimal assistance;Contact guard assist;Rolling Stroebel (2 wheels) General ADL Comments: increased time to perform all tasks with increased cues for all tasks    Extremity/Trunk Assessment  Vision       Perception     Praxis     Communication Communication Communication: No apparent difficulties   Cognition Arousal: Alert Behavior During Therapy: WFL for tasks assessed/performed                                  Following commands: Intact        Cueing   Cueing Techniques: Verbal cues, Tactile cues  Exercises Other Exercises Other Exercises: Edu on importance of sitting up in chair and getting OOB daily.    Shoulder Instructions       General Comments PICC line intact pre/post session    Pertinent Vitals/ Pain       Pain Assessment Pain Assessment: Faces Faces Pain Scale: Hurts even more Pain Location: stomach and RLE Pain Descriptors / Indicators: Grimacing, Moaning, Guarding Pain Intervention(s): Monitored during session, Repositioned  Home Living                                          Prior Functioning/Environment              Frequency  Min 2X/week        Progress Toward Goals  OT Goals(current goals can now be found in the care plan section)  Progress towards OT goals: Progressing toward goals  Acute Rehab OT Goals Patient Stated Goal: Get out of here OT Goal Formulation: With patient Time For Goal Achievement: 05/25/24 Potential to Achieve Goals: Good  Plan      Co-evaluation                 AM-PAC OT 6 Clicks Daily Activity     Outcome Measure   Help from another person eating meals?: A Little Help from another person taking care of personal grooming?: A Little Help from another person toileting, which includes using toliet, bedpan, or urinal?: A Little Help from another person bathing (including washing, rinsing, drying)?: A Lot Help from another person to put on and taking off regular upper body clothing?: A Little Help from another person to put on and taking off regular lower body clothing?: A Lot 6 Click Score: 16    End of Session Equipment Utilized During Treatment: Rolling Granberry (2 wheels)  OT Visit Diagnosis: Muscle weakness (generalized) (M62.81);Other abnormalities of gait and mobility (R26.89);Pain   Activity Tolerance Patient tolerated treatment well   Patient  Left in chair;with call bell/phone within reach;with chair alarm set;with nursing/sitter in room   Nurse Communication Mobility status        Time: 8849-8792 OT Time Calculation (min): 17 min  Charges: OT General Charges $OT Visit: 1 Visit OT Treatments $Self Care/Home Management : 8-22 mins  Milli Woolridge, OTR/L  05/15/24, 12:34 PM   Arkie Tagliaferro E Olanda Downie 05/15/2024, 12:32 PM

## 2024-05-15 NOTE — IPAL (Signed)
  Interdisciplinary Goals of Care Family Meeting   Date carried out: 05/15/2024  Location of the meeting: Unit conference area  Member's involved:  Laneta Marsa ROSALEA Arna Flauhaus NP [phone] (PACE) [name], pt's cousin in person Koren, pt's brother on phone  Durable Power of Attorney or acting medical decision maker: Koren    Discussion: We discussed goals of care for Rollene Finder .  Reduce/stop opiates, IV tylenol  only, appetite stimulant, stimulant laxative, continue other bowel regimen, advance diet as able, TPN for now - maximal medical therapy to achieve desired goal of getting better which [name] and Koren describe as pt eating and going home back to her normal. Arna and I advise the her former baseline is not possible, she is at risk w/ her bowel status and recurrent ileus, she is at risk w/ her dementia, she is complaining of pain and more or less she wants to be left alone / in peace. While not actively end of life, if she is not eating her organs will start to shut down We have allowed time for outcomes already and she is not worsening but she is certainly not progressing as well as we'd hope. Approaching plateau - may need to consider LTAC, [name] and Koren will discuss.   Code status:   Code Status: Full Code   Disposition: Continue current acute care  Time spent for the meeting: 35 min     Laneta Marsa, DO  05/15/2024, 5:39 PM

## 2024-05-15 NOTE — Progress Notes (Addendum)
 05/15/2024  Subjective: Patient is 18 Days Post-Op.  Patient had BM recorded yesterday although she denies any bowel movement or flatus.  Not cooperative and does not want to talk to me much.  Vital signs: Temp:  [98.2 F (36.8 C)-99.5 F (37.5 C)] 98.4 F (36.9 C) (11/17 0800) Pulse Rate:  [78-92] 82 (11/17 0800) Resp:  [16-18] 16 (11/17 0800) BP: (111-131)/(59-69) 120/68 (11/17 0800) SpO2:  [100 %] 100 % (11/17 0800) Weight:  [84.1 kg] 84.1 kg (11/17 0657)   Intake/Output: 11/16 0701 - 11/17 0700 In: 375.2 [I.V.:375.2] Out: 610 [Urine:610] Last BM Date : 05/13/24  Physical Exam: Constitutional:  No acute distress Abdomen:  Patient refused to be examined today and kept her sheets/blanket.  Labs:  Recent Labs    05/13/24 0630  WBC 10.8*  HGB 8.0*  HCT 24.8*  PLT 354   Recent Labs    05/13/24 0630 05/15/24 0401  NA 140 140  K 4.4 4.8  CL 113* 114*  CO2 22 21*  GLUCOSE 118* 108*  BUN 22 28*  CREATININE 0.74 0.80  CALCIUM  8.3* 8.4*   No results for input(s): LABPROT, INR in the last 72 hours.  Imaging: No results found.  Assessment/Plan: This is a 68 y.o. female s/p exlap, small bowel resection, cecectomy, with ileocolonic anastomosis.   --Unable to examine the patient.  Per sitter, she had been reporting abdominal pain.  However, she had BM's recorded over the weekend. --I advanced her diet to full liquids for now, will monitor to see how she tolerates. --Continue TPN at full rate for now given low po intake. --Not sure how much more can be offered from surgical standpoint.  She is having bowel function so hopefully her diet can be advanced further.  But her po intake is low.  G tube would not be recommended given that she's pulled multiple PICC lines during his hospital stay.  Unfortunately TPN also has its possible complications long term, not to mention the frequent PICC line placements.  She's also refusing care in that I have not been able to examine  her and she also refused for me to remove her incision staples.  She did not want to get out of bed either.  --Concern that she'll just continue to decline.  She's not in dying process, but it seems that she will continue to decline. --Will continue to follow with you.   Aloysius [name], MD [facility]

## 2024-05-16 ENCOUNTER — Inpatient Hospital Stay

## 2024-05-16 DIAGNOSIS — Z7189 Other specified counseling: Secondary | ICD-10-CM | POA: Diagnosis not present

## 2024-05-16 DIAGNOSIS — K559 Vascular disorder of intestine, unspecified: Secondary | ICD-10-CM | POA: Diagnosis not present

## 2024-05-16 LAB — GLUCOSE, CAPILLARY
Glucose-Capillary: 102 mg/dL — ABNORMAL HIGH (ref 70–99)
Glucose-Capillary: 103 mg/dL — ABNORMAL HIGH (ref 70–99)
Glucose-Capillary: 114 mg/dL — ABNORMAL HIGH (ref 70–99)
Glucose-Capillary: 118 mg/dL — ABNORMAL HIGH (ref 70–99)
Glucose-Capillary: 145 mg/dL — ABNORMAL HIGH (ref 70–99)

## 2024-05-16 LAB — POTASSIUM: Potassium: 4.5 mmol/L (ref 3.5–5.1)

## 2024-05-16 MED ORDER — TRACE MINERALS CU-MN-SE-ZN 300-55-60-3000 MCG/ML IV SOLN
INTRAVENOUS | Status: AC
  Filled 2024-05-16: qty 720

## 2024-05-16 NOTE — Progress Notes (Signed)
 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). Complaints of diffuse severe abdominal pain of unknown duration and hypertensive crisis. CTA C/A/P showed closed-loop SBO with developing ischemia. She received Kcentra for DOAC reversal and underwent emergent ex lap on 04/27/2024 with reduction of internal hernia, resection of approximately 110 cm distal small bowel with ileocecectomy, and ileocolic anastomosis. Status post transfusion of 1 unit pRBC on 11/3 for Hgb 6.4. Course complicated by postop ileus as of 11/05. IV Zosyn was stopped on 11/07 after 8 days JP drain removed on 11/08. Had hematochezia x 7 episodes on 11/9-11/10 AM with Hgb drop to nadir of 5.4. Transfused 2 units pRBC. Surgery aware, suspect bleeding at anastomosis site, inaccessible endoscopically. Has been on TPN and recently started on po diet but her intake has been poor. Has pulled PICC line few times now. Persistent Ileus and abd pain worse 11/13 and persistent into 11/16, have had to deescalate diet back to clears. 11/17 trial advance to full liquids. Pt still not moving around much and not taking much po into 11/18 and remains on TPN. TOC looking into LTACH     Consultants:  General Surgery   Procedures/Surgeries: 04/27/24: exploratory laparotomy with small bowel resection, ileocecectomy, ileocolonic anastomosis and reduction of internal hernia      ASSESSMENT & PLAN:   # Ischemic bowel status post ex lap with SB resection plus ileocecectomy and ileocolic anastomosis (10/30) # Course  complicated by post-op ileus (11/05) # Persistent/recurrent ileus (KUB 11/13, 11/15) General surgery following  remains on TPN restart on CLD 11/13, advanced to FLD 11/17, continue advancing as able  Question for feeding tube but given dementia she is a poor candidate for this - I have discussed w/ family that if she is not able to meet her caloric needs will need to discuss further but given has pulled PICC multiple times she will surely do same with tube.   Miralax  daily Staples removed 11/15 Persistent ileus and pt has plateau'ed here past 4-5 days, general surgery ok for LTACH and TOC is looking into whether she can go there to continue TPN / allow time for outcomes w/ goal eventual discharge vs transition to LTC   # Acute blood loss anemia - resolved # Hematochezia - resolved  Monitor with serial H/H Continue holding Eliquis   # History of PE s/p IVC filter Eliquis held due to hematochezia and acute blood loss anemia, continue hold d/t anemia and also in case needing further procedures    # Hypertensive urgency - resolved # Primary hypertension - stable  BP stable Continue amlodipine  10 mg daily and clonidine  patch   # AKI on CKD 3a - resolved Monitor BMP   #Chronic 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 Not oriented to situation.  Complicates her care - has pulled PICC several times which is needed for TPN at this point. She's needing sitter. Surely will pull a feeding tube. Will discuss further with family. If dementia is limiting her ability to receive safe care / if treatments are causing distress, would deescalate to focus on  treating as able and prioritizing QOL   # GOC Patient has no insight into the severity of her medical condition. Has no capacity to make her own medical decisions Decision-maker(s): Pt's cousin [name] is usually at bedside - he is her financial POA and pt's brother Koren is her HCPOA. If we are unable to reach Koren, he assigns [employer] as education officer, environmental and he asks we do NOT involve patient's children so I have inactivated them in her contacts per his request.  goal to wean off TPN and advance diet - if not able to accomplish this such that pt is meeting nutritional requirements, then would need to talk about alternative plan and likely hospice Not a good candidate for feeding tube  D/w [name] and Koren (by phone in the room) at bedside 11/13 - confirmed for FULL CODE, I did advise that in situations of severe weakness/malnourishment, I strongly recommend against CPR. I do not believe patient is actively dying at this point, my greatest concern is if she is not improving her po intake then she will certainly continue to decline. Time will tell. Koren and [name] state that when she was lucid years ago she mentioned ~NOT~ wanting to be a DNR so they are going by her wishes, but are open to revisit the conversation if she is getting worse / has terminal illness.  See above re dementia     overweight based on BMI: Body mass index is 27.14 kg/m.SABRA Significantly low or high BMI is associated with higher medical risk.  Underweight - under 18  overweight - 25 to 29 obese - 30 or more Class 1 obesity: BMI of 30.0 to 34 Class 2 obesity: BMI of 35.0 to 39 Class 3 obesity: BMI of 40.0 to 49 Super Morbid Obesity: BMI 50-59 Super-super Morbid Obesity: BMI 60+ Healthy nutrition and physical activity advised as adjunct to other disease management and risk reduction treatments     DVT prophylaxis: holding d/t bleed risk IV fluids: no continuous IV fluids  Nutrition: TPN and advancing po as tolerate but intake has  been poor as above. CLD for now  Unumprovident / other devices: PICC   Code Status: FULL CODE ACP documentation reviewed:  none on file in [facility]  TOC needs: TBD, potential hospice / back to SNF w/ PT/OT if able. TOC looking into LTAC Medical barriers to dispo: TPN. Expected medical readiness for discharge pending po intake / GOC.              Subjective / Brief ROS:  She is not oriented but she is pleasant. She reports abd pain same as yesterday she denies knee pain    Family Communication: d/w cousin, [name], and his wife, who were at bedside on rounds    Objective Findings:  Vitals:   05/16/24 0511 05/16/24 0805 05/16/24 1415 05/16/24 1420  BP: 125/66 103/60 (!) 108/59 130/62  Pulse: 86 82 82 67  Resp: 18 20 18 18   Temp: 98.5 F (36.9 C) 98.3 F (36.8 C) 98.9 F (37.2 C) 98.5 F (36.9 C)  TempSrc:   Oral   SpO2: 100% 100% 100% 100%  Weight:  Height:        Intake/Output Summary (Last 24 hours) at 05/16/2024 1637 Last data filed at 05/16/2024 0540 Gross per 24 hour  Intake 970.04 ml  Output --  Net 970.04 ml   Filed Weights   05/14/24 0500 05/15/24 0657 05/16/24 0500  Weight: 82.1 kg 84.1 kg 82.7 kg    Examination:  Physical Exam Constitutional:      General: She is not in acute distress. Cardiovascular:     Rate and Rhythm: Normal rate and regular rhythm.     Heart sounds: Murmur heard.  Pulmonary:     Effort: Pulmonary effort is normal.     Breath sounds: Normal breath sounds.  Abdominal:     General: Bowel sounds are normal.     Palpations: Abdomen is soft.     Tenderness: There is generalized abdominal tenderness and tenderness in the left upper quadrant. There is no rebound.  Neurological:     Mental Status: She is alert. She is disoriented.  Psychiatric:        Mood and Affect: Mood normal.        Behavior: Behavior normal.          Scheduled Medications:   sodium chloride    Intravenous Once   sodium chloride     Intravenous Once   acetaminophen   1,000 mg Oral Q8H   amLODipine   10 mg Oral Daily   Chlorhexidine  Gluconate Cloth  6 each Topical Daily   cloNIDine   0.1 mg Transdermal Weekly   feeding supplement  237 mL Oral TID BM   insulin  aspart  0-6 Units Subcutaneous Q6H   irbesartan  150 mg Oral Daily   levothyroxine   100 mcg Oral q morning   megestrol  40 mg Oral Daily   melatonin  5 mg Oral QHS   polyethylene glycol  17 g Oral Daily   senna-docusate  2 tablet Oral QHS   sodium chloride  flush  10-40 mL Intracatheter Q12H    Continuous Infusions:  TPN ADULT (ION) 75 mL/hr at 05/15/24 1645   TPN ADULT (ION)      PRN Medications:  ketorolac , LORazepam, OLANZapine  zydis, mouth rinse, oxyCODONE, sodium chloride  flush, ziprasidone  Antimicrobials from admission:  Anti-infectives (From admission, onward)    Start     Dose/Rate Route Frequency Ordered Stop   04/28/24 0600  piperacillin-tazobactam (ZOSYN) IVPB 3.375 g  Status:  Discontinued        3.375 g 12.5 mL/hr over 240 Minutes Intravenous Every 8 hours 04/28/24 0256 05/05/24 1340   04/27/24 2300  cefoTEtan (CEFOTAN) 2 g in sodium chloride  0.9 % 100 mL IVPB        2 g 200 mL/hr over 30 Minutes Intravenous On call to O.R. 04/27/24 2255 04/28/24 1319           Data Reviewed:  I have personally reviewed the following...  CBC: Recent Labs  Lab 05/10/24 0453 05/11/24 0553 05/12/24 0336 05/13/24 0630  WBC 13.8* 12.6* 10.2 10.8*  HGB 7.7* 8.0* 7.7* 8.0*  HCT 23.0* 24.5* 23.3* 24.8*  MCV 96.2 97.2 97.5 99.6  PLT 278 302 317 354   Basic Metabolic Panel: Recent Labs  Lab 05/11/24 0553 05/12/24 0336 05/13/24 0630 05/15/24 0401 05/16/24 0608  NA 138  --  140 140  --   K 4.2  --  4.4 4.8 4.5  CL 108  --  113* 114*  --   CO2 22  --  22 21*  --   GLUCOSE 111*  --  118* 108*  --   BUN 19  --  22 28*  --   CREATININE 0.73  --  0.74 0.80  --   CALCIUM  8.3*  --  8.3* 8.4*  --   MG 1.9 1.8  --  2.0  --   PHOS 3.2  --   --   3.7  --    GFR: Estimated Creatinine Clearance: 76.6 mL/min (by C-G formula based on SCr of 0.8 mg/dL). Liver Function Tests: Recent Labs  Lab 05/11/24 0553 05/15/24 0401  AST 31 14*  ALT 68* 22  ALKPHOS 105 82  BILITOT 0.2 0.2  PROT 5.5* 5.0*  ALBUMIN 2.9* 2.6*   No results for input(s): LIPASE, AMYLASE in the last 168 hours. No results for input(s): AMMONIA in the last 168 hours. Coagulation Profile: No results for input(s): INR, PROTIME in the last 168 hours.  Cardiac Enzymes: No results for input(s): CKTOTAL, CKMB, CKMBINDEX, TROPONINI in the last 168 hours. BNP (last 3 results) No results for input(s): PROBNP in the last 8760 hours. HbA1C: No results for input(s): HGBA1C in the last 72 hours. CBG: Recent Labs  Lab 05/15/24 1143 05/15/24 1739 05/16/24 0006 05/16/24 0522 05/16/24 1136  GLUCAP 111* 126* 145* 102* 118*   Lipid Profile: Recent Labs    05/15/24 0401  TRIG 82    Thyroid Function Tests: No results for input(s): TSH, T4TOTAL, FREET4, T3FREE, THYROIDAB in the last 72 hours. Anemia Panel: No results for input(s): VITAMINB12, FOLATE, FERRITIN, TIBC, IRON, RETICCTPCT in the last 72 hours. Most Recent Urinalysis On File:     Component Value Date/Time   COLORURINE STRAW (A) 04/27/2024 2041   APPEARANCEUR CLEAR (A) 04/27/2024 2041   APPEARANCEUR Clear 04/01/2013 1228   LABSPEC 1.017 04/27/2024 2041   LABSPEC 1.014 04/01/2013 1228   PHURINE 6.0 04/27/2024 2041   GLUCOSEU >=500 (A) 04/27/2024 2041   GLUCOSEU Negative 04/01/2013 1228   HGBUR NEGATIVE 04/27/2024 2041   BILIRUBINUR NEGATIVE 04/27/2024 2041   BILIRUBINUR Negative 04/01/2013 1228   KETONESUR 5 (A) 04/27/2024 2041   PROTEINUR 30 (A) 04/27/2024 2041   NITRITE NEGATIVE 04/27/2024 2041   LEUKOCYTESUR TRACE (A) 04/27/2024 2041   LEUKOCYTESUR Negative 04/01/2013 1228   Sepsis Labs: @LABRCNTIP (procalcitonin:4,lacticidven:4) Microbiology: No  results found for this or any previous visit (from the past 240 hours).    Radiology Studies last 3 days: DG Abd 2 Views Result Date: 05/16/2024 CLINICAL DATA:  [id number] Ileus, postoperative (HCC) [id number] EXAM: DG ABDOMEN 2V COMPARISON:  Radiographs 05/13/2024.  Abdominal CT 05/03/2024. FINDINGS: 0755 hours. Two supine views of the abdomen are submitted. No significant change in mildly dilated loops of small bowel in the left abdomen. Gas remains within the rectum. No supine evidence of pneumoperitoneum. Skin staples have been removed in the interval. IVC filter, cholecystectomy clips and embolization coils are again noted. There is multilevel spondylosis. IMPRESSION: No significant change in mildly dilated loops of small bowel in the left abdomen, likely postoperative ileus. Electronically Signed   By: Elsie Perone M.D.   On: 05/16/2024 12:12   DG Chest Port 1 View Result Date: 05/14/2024 CLINICAL DATA:  PICC line placement EXAM: PORTABLE CHEST 1 VIEW COMPARISON:  05/03/2024 FINDINGS: Left-sided PICC line tip overlies the mid to distal SVC. Lungs are clear bilaterally. The cardiopericardial silhouette is within normal limits for size. No acute bony abnormality. IMPRESSION: Left-sided PICC line tip overlies the mid to distal SVC. Electronically Signed   By: Camellia Candle M.D.   On: 05/14/2024  04:55   DG Abd 2 Views Result Date: 05/13/2024 CLINICAL DATA:  Ileus. EXAM: ABDOMEN - 2 VIEW COMPARISON:  05/11/2024 FINDINGS: Gas distended small bowel in the left abdomen is similar to prior. Decreased gas in the transverse colon with residual gas visible in the nondilated splenic flexure, descending colon, and rectum. IVC filter in embolization coils overlie the medial right abdomen. IMPRESSION: Gas distended small bowel in the left abdomen is similar to prior. Gas is seen in the nondilated left colon and rectum. Electronically Signed   By: Camellia Candle M.D.   On: 05/13/2024 07:29       Time spent: 50  min     Shaquill Iseman, DO [facility] 05/16/2024, 4:37 PM    Dictation software may have been used to generate the above note. Typos may occur and escape review in typed/dictated notes. Please contact Dr Marsa directly for clarity if needed.  Staff may message me via secure chat in Epic  but this may not receive an immediate response,  please page me for urgent matters!  If 7PM-7AM, please contact night coverage [email or web address]

## 2024-05-16 NOTE — Progress Notes (Signed)
 05/16/2024  Subjective: Patient is 19 Days Post-Op.  Discussed with overnight RN.  Had flatus but no BM, tolerated some Ensure, but remains with low appetite.  Patient did not let me examine her today and started cursing at me to get away from her.  Vital signs: Temp:  [98.4 F (36.9 C)-98.5 F (36.9 C)] 98.5 F (36.9 C) (11/18 0511) Pulse Rate:  [82-110] 86 (11/18 0511) Resp:  [16-18] 18 (11/18 0511) BP: (106-125)/(57-79) 125/66 (11/18 0511) SpO2:  [100 %] 100 % (11/18 0511) Weight:  [82.7 kg] 82.7 kg (11/18 0500)   Intake/Output: 11/17 0701 - 11/18 0700 In: 1210 [P.O.:600; I.V.:610] Out: 200 [Urine:200] Last BM Date : 05/14/24  Physical Exam: Constitutional: No acute distress Abdomen:  Unable to examine.  Labs:  No results for input(s): WBC, HGB, HCT, PLT in the last 72 hours. Recent Labs    05/15/24 0401  NA 140  K 4.8  CL 114*  CO2 21*  GLUCOSE 108*  BUN 28*  CREATININE 0.80  CALCIUM  8.4*   No results for input(s): LABPROT, INR in the last 72 hours.  Imaging: No results found.  Assessment/Plan: This is a 68 y.o. female s/p exlap, small bowel resection and ileocecectomy.  --She is having flatus but no BM reported yesterday.  Since she's not letting me examine her, will order KUB this morning to check on the progress of her small bowel dilation.  If improving, then could advance her diet further.  --For now, continue full liquids and TPN at full rate.   Aloysius [name], MD [facility]

## 2024-05-16 NOTE — Progress Notes (Addendum)
 Daily Progress Note   Patient Name: Kylie [name]       Date: 05/16/2024 DOB: 03-15-1956  Age: 68 y.o. MRN#: [id number] Attending Physician: Marsa Edelman, DO Primary Care Physician: [facility], [facility] Admit Date: 04/27/2024  Reason for Consultation/Follow-up: Establishing goals of care  Subjective: Notes and labs reviewed.  Per notes, surgery service was unable to assess patient this morning and KUB was ordered.  In to see patient.  She is currently sitting up in bed chewing gum with breakfast in front of her.  She has a comptroller at bedside.  Her cousin Champ is at bedside with his wife.  We discussed her status and various scenarios.  We discussed the need for the KUB as surgery service was unable to examine her; we discussed this in the greater context of care moving forward.  We discussed her oral intake, and what would be needed to be able to stop TPN.  He states they have decided not to place a PEG tube, but they would be amenable to an LTACH facility to continue TPN.   We discussed pain and suffering, and quality versus quantity of life.  He discusses that she is due for a knee joint steroid injection.  Length of Stay: 19  Current Medications: Scheduled Meds:   sodium chloride    Intravenous Once   sodium chloride    Intravenous Once   acetaminophen   1,000 mg Oral Q8H   amLODipine   10 mg Oral Daily   Chlorhexidine  Gluconate Cloth  6 each Topical Daily   cloNIDine   0.1 mg Transdermal Weekly   feeding supplement  237 mL Oral TID BM   insulin  aspart  0-6 Units Subcutaneous Q6H   irbesartan  150 mg Oral Daily   levothyroxine   100 mcg Oral q morning   megestrol  40 mg Oral Daily   melatonin  5 mg Oral QHS   polyethylene glycol  17 g Oral Daily   senna-docusate  2 tablet  Oral QHS   sodium chloride  flush  10-40 mL Intracatheter Q12H    Continuous Infusions:  TPN ADULT (ION) 75 mL/hr at 05/15/24 1645   TPN ADULT (ION)      PRN Meds: ketorolac , LORazepam, OLANZapine  zydis, mouth rinse, oxyCODONE, sodium chloride  flush, ziprasidone  Physical Exam Pulmonary:     Effort: Pulmonary effort is normal.  Neurological:     Mental Status: She is alert.             Vital Signs: BP 103/60 (BP Location: Right Arm)   Pulse 82   Temp 98.3 F (36.8 C)   Resp 20   Ht 5' 10 (1.778 m)   Wt 82.7 kg   SpO2 100%   BMI 26.16 kg/m  SpO2: SpO2: 100 % O2 Device: O2 Device: Room Air O2 Flow Rate: O2 Flow Rate (L/min): 6 L/min  Intake/output summary:  Intake/Output Summary (Last 24 hours) at 05/16/2024 1157 Last data filed at 05/16/2024 0540 Gross per 24 hour  Intake 1090.04 ml  Output --  Net 1090.04 ml   LBM: Last BM Date : 05/14/24 Baseline Weight: Weight: 79.5 kg Most recent weight: Weight: 82.7 kg   Patient Active Problem List   Diagnosis Date Noted   Malnutrition of moderate degree 04/29/2024   Ischemic bowel disease 04/27/2024   Closed loop obstruction of intestine (HCC) 04/27/2024   Chronic combined systolic and diastolic CHF (congestive heart failure) (HCC) 04/27/2024   Leukocytosis 04/27/2024   HLD (hyperlipidemia) 04/27/2024   CAD (coronary artery disease) 04/27/2024   Depression 04/27/2024   Chronic kidney disease, stage 3a (HCC) 04/27/2024   Hypertensive urgency 04/27/2024   QT prolongation 04/27/2024   Hypokalemia 10/07/2023   Sepsis, severe, due to pneumonia (HCC) 10/06/2023   RSV (respiratory syncytial virus pneumonia) 10/06/2023   Acute respiratory failure with hypoxia (HCC) 10/06/2023   Rhabdomyolysis, traumatic 08/03/2023   Hypoglycemia 08/03/2023   S/p R nephrectomy for RCC 08/03/2023   History of pulmonary embolism 08/03/2023   History of CVA (cerebrovascular accident) 08/03/2023   Dementia (HCC)    Chronic anticoagulation  due to history of pulmonary embolism 05/25/2017   CVA (cerebral vascular accident) (HCC) 05/25/2017   Hyperlipidemia 05/25/2017   Obesity (BMI 30.0-34.9) 10/22/2016   Type 2 diabetes mellitus without complication, without long-term current use of insulin  (HCC) 11/14/2014   Acquired hypothyroidism 02/19/2014   Allergic rhinitis 02/19/2014   Neuropathy 02/19/2014   Primary osteoarthritis of both knees 02/19/2014   Severe episode of recurrent major depressive disorder, without psychotic features (HCC) 02/19/2014   Vitamin B 12 deficiency 02/19/2014   Vitamin D  deficiency 02/19/2014   PE (pulmonary thromboembolism) (HCC) 10/03/2012   Malignant neoplasm of kidney (HCC) 03/21/2012   Gallstones without obstruction of gallbladder 05/08/2009   Sleep apnea 07/05/2000   Essential hypertension 12/06/1996    Palliative Care Assessment & Plan    Recommendations/Plan: Continue full code and full scope at this time. Family has decided they would not want to place a feeding tube   Code Status:    Code Status Orders  (From admission, onward)           Start     Ordered   04/27/24 2311  Full code  Continuous       Question:  By:  Answer:  Consent: discussion documented in EHR   04/27/24 2311           Code Status History     Date Active Date Inactive Code Status Order ID Comments User Context   10/06/2023 2250 10/11/2023 1912 Full Code [id number]  Cleatus Delayne GAILS, MD ED   08/03/2023 0403 08/05/2023 2021 Full Code [id number]  Cleatus Delayne GAILS, MD ED      Advance Directive Documentation    Flowsheet Row Most Recent Value  Type of Advance Directive Healthcare Power of Attorney  Pre-existing out of facility DNR  order (yellow form or pink MOST form) --  MOST Form in Place? --    Attending service updated.   Camelia Lewis, NP  Please contact Palliative Medicine Team phone at [phone] for questions and concerns.

## 2024-05-16 NOTE — Progress Notes (Signed)
 PHARMACY - TOTAL PARENTERAL NUTRITION CONSULT NOTE   Indication: Small bowel obstruction  Patient Measurements: Height: 5' 10 (177.8 cm) Weight: 82.7 kg (182 lb 5.1 oz) IBW/kg (Calculated) : 68.5 TPN AdjBW (KG): 79.6 Body mass index is 26.16 kg/m. Usual Weight: 75.8 kg (02/07/24)  Assessment:  Kylie [name] is a 68yoF that presented with upper abdominal pain. Patient's past medical history notable for dementia, HTN, HLD, sCHF with EF 40-45% (10/08/23), CVA, CKD-3a, depression, DM, PE on apixaban, and s/p right nephrectomy in 2009 for right renal cell carcinoma.  Glucose / Insulin : BG 102-126. (No SSI used in last 24 hours)  Electrolytes: K 4.5, Mag 2.0, Phos 3.7, Corrected Ca 9.5 Renal: Scr 0.79>0.74>0.8 Hepatic: Alk phos 82, AST 14, ALT 22, total bili 0.2, albumin 2.6 Intake / Output; MIVF: Net since admission: (+) 13L GI Imaging: 10/30 CT Angio Impression: Closed loop small bowel obstruction likely secondary to an internal hernia with findings concerning for developing bowel ischemia. Clinical correlation and surgical consult is advised. Status post right nephrectomy. 11/5 CTABD: Dilated small-bowel loops up to 4.3 cm without focal obstruction, consistent with ileus. Small amount of free fluid. 11/13 Abd XR: Persistent small bowel distention suggestive of continued post-op ileus. GI Surgeries / Procedures:  10/31: Exploratory Laparotomy, reduction of internal hernia, small bowel resection with cecectomy (length 110 cm), ileocolic anastomosis  ID Zosyn 10/31 >> 11/7  Central access: yes TPN start date: 04/28/2024  Nutritional Goals: Goal TPN rate is 75 mL/hr (provides 108 g of protein and 2005 kcals per day)  RD Assessment: Estimated Needs Total Energy Estimated Needs: 2000-2300kcal/day Total Protein Estimated Needs: 100-115g/day Total Fluid Estimated Needs: 1.8-2.1L/day  Current Nutrition:  Full liquid 11/7 >> soft foods 11/11 >> clear liquids 11/13 >> full liquids  11/17  Plan:  Per surgery, patient having bowel function but not eating much Continue TPN to 75 ml/hr (goal rate) at 1800 Electrolytes in TPN (standard): Na 50mEq/L, K 50mEq/L, Ca 22mEq/L, Mg 66mEq/L, and Phos 15mmol/L Cl:Ac 1:1 (watch) Add standard MVI and trace elements to TPN Thiamine 100mg  IV daily x 7 days per dietician (completed) Continue Sensitive  q6h SSI and adjust as needed  Monitor TPN labs on Mon/Thurs - check potassium in AM  Thank you for involving pharmacy in this patient's care.   Damien Napoleon, PharmD Clinical Pharmacist 05/16/2024 8:59 AM

## 2024-05-16 NOTE — Plan of Care (Signed)
  Problem: Coping: Goal: Ability to adjust to condition or change in health will improve Outcome: Progressing   Problem: Education: Goal: Knowledge of General Education information will improve Description: Including pain rating scale, medication(s)/side effects and non-pharmacologic comfort measures Outcome: Progressing   Problem: Elimination: Goal: Will not experience complications related to urinary retention Outcome: Progressing   Problem: Safety: Goal: Ability to remain free from injury will improve Outcome: Progressing

## 2024-05-16 NOTE — Plan of Care (Signed)
  Problem: Nutritional: Goal: Maintenance of adequate nutrition will improve Outcome: Progressing   Problem: Skin Integrity: Goal: Risk for impaired skin integrity will decrease Outcome: Progressing   Problem: Clinical Measurements: Goal: Ability to maintain clinical measurements within normal limits will improve Outcome: Progressing   Problem: Clinical Measurements: Goal: Will remain free from infection Outcome: Progressing

## 2024-05-17 DIAGNOSIS — Z7189 Other specified counseling: Secondary | ICD-10-CM | POA: Diagnosis not present

## 2024-05-17 LAB — BASIC METABOLIC PANEL WITH GFR
Anion gap: 7 (ref 5–15)
BUN: 38 mg/dL — ABNORMAL HIGH (ref 8–23)
CO2: 19 mmol/L — ABNORMAL LOW (ref 22–32)
Calcium: 8.7 mg/dL — ABNORMAL LOW (ref 8.9–10.3)
Chloride: 111 mmol/L (ref 98–111)
Creatinine, Ser: 0.86 mg/dL (ref 0.44–1.00)
GFR, Estimated: >60 mL/min (ref >60)
Glucose, Bld: 108 mg/dL — ABNORMAL HIGH (ref 70–99)
Potassium: 4.6 mmol/L (ref 3.5–5.1)
Sodium: 138 mmol/L (ref 135–145)

## 2024-05-17 LAB — CBC
HCT: 24.1 % — ABNORMAL LOW (ref 36.0–46.0)
Hemoglobin: 8 g/dL — ABNORMAL LOW (ref 12.0–15.0)
MCH: 32.3 pg (ref 26.0–34.0)
MCHC: 33.2 g/dL (ref 30.0–36.0)
MCV: 97.2 fL (ref 80.0–100.0)
Platelets: 350 K/uL (ref 150–400)
RBC: 2.48 MIL/uL — ABNORMAL LOW (ref 3.87–5.11)
RDW: 15.1 % (ref 11.5–15.5)
WBC: 7.9 K/uL (ref 4.0–10.5)
nRBC: 0 % (ref 0.0–0.2)

## 2024-05-17 LAB — GLUCOSE, CAPILLARY
Glucose-Capillary: 105 mg/dL — ABNORMAL HIGH (ref 70–99)
Glucose-Capillary: 127 mg/dL — ABNORMAL HIGH (ref 70–99)
Glucose-Capillary: 116 mg/dL — ABNORMAL HIGH (ref 70–99)
Glucose-Capillary: 109 mg/dL — ABNORMAL HIGH (ref 70–99)

## 2024-05-17 LAB — TROPONIN T, HIGH SENSITIVITY: Troponin T High Sensitivity: 44 ng/L — ABNORMAL HIGH (ref 0–19)

## 2024-05-17 MED ORDER — TRACE MINERALS CU-MN-SE-ZN 300-55-60-3000 MCG/ML IV SOLN
INTRAVENOUS | Status: AC
  Filled 2024-05-17: qty 720

## 2024-05-17 NOTE — Progress Notes (Addendum)
 Daily Progress Note   Patient Name: Kylie [name]       Date: 05/17/2024 DOB: Oct 04, 1955  Age: 68 y.o. MRN#: [id number] Attending Physician: Lanetta Lingo, MD Primary Care Physician: [facility] Admit Date: 04/27/2024  Reason for Consultation/Follow-up: Establishing goals of care  Subjective: Notes and labs reviewed.  In to see patient.  She is currently resting in bed at this time with sitter at bedside.  She states she has been having abdominal pain, and feels that she is just not hungry.  She states the pain is under her incision.  She did allow me to palpate her abdomen and did not have any increased pain.  Abdomen was soft, nondistended, and nontender.  Discussed reaching out to surgery service and patient became upset, cursed, and told me to leave the room.  Attending team updated.  Length of Stay: 20  Current Medications: Scheduled Meds:   sodium chloride    Intravenous Once   sodium chloride    Intravenous Once   acetaminophen   1,000 mg Oral Q8H   amLODipine   10 mg Oral Daily   Chlorhexidine  Gluconate Cloth  6 each Topical Daily   cloNIDine   0.1 mg Transdermal Weekly   feeding supplement  237 mL Oral TID BM   insulin  aspart  0-6 Units Subcutaneous Q6H   irbesartan  150 mg Oral Daily   levothyroxine   100 mcg Oral q morning   megestrol  40 mg Oral Daily   melatonin  5 mg Oral QHS   polyethylene glycol  17 g Oral Daily   senna-docusate  2 tablet Oral QHS   sodium chloride  flush  10-40 mL Intracatheter Q12H    Continuous Infusions:  TPN ADULT (ION) 75 mL/hr at 05/16/24 1730   TPN ADULT (ION)      PRN Meds: LORazepam, OLANZapine  zydis, mouth rinse, oxyCODONE, sodium chloride  flush, ziprasidone  Physical Exam Pulmonary:     Effort: Pulmonary effort is  normal.  Abdominal:     General: There is no distension.     Comments: Nontender  Neurological:     Mental Status: She is alert.             Vital Signs: BP (!) 106/58   Pulse 83   Temp 98.3 F (36.8 C) (Oral)   Resp 18   Ht 5' 10 (  1.778 m)   Wt 83.4 kg   SpO2 100%   BMI 26.38 kg/m  SpO2: SpO2: 100 % O2 Device: O2 Device: Room Air O2 Flow Rate: O2 Flow Rate (L/min): 6 L/min  Intake/output summary:  Intake/Output Summary (Last 24 hours) at 05/17/2024 1555 Last data filed at 05/17/2024 1300 Gross per 24 hour  Intake 2784.53 ml  Output --  Net 2784.53 ml   LBM: Last BM Date : 05/16/24 Baseline Weight: Weight: 79.5 kg Most recent weight: Weight: 83.4 kg   Patient Active Problem List   Diagnosis Date Noted   Malnutrition of moderate degree 04/29/2024   Ischemic bowel disease 04/27/2024   Closed loop obstruction of intestine (HCC) 04/27/2024   Chronic combined systolic and diastolic CHF (congestive heart failure) (HCC) 04/27/2024   Leukocytosis 04/27/2024   HLD (hyperlipidemia) 04/27/2024   CAD (coronary artery disease) 04/27/2024   Depression 04/27/2024   Chronic kidney disease, stage 3a (HCC) 04/27/2024   Hypertensive urgency 04/27/2024   QT prolongation 04/27/2024   Hypokalemia 10/07/2023   Sepsis, severe, due to pneumonia (HCC) 10/06/2023   RSV (respiratory syncytial virus pneumonia) 10/06/2023   Acute respiratory failure with hypoxia (HCC) 10/06/2023   Rhabdomyolysis, traumatic 08/03/2023   Hypoglycemia 08/03/2023   S/p R nephrectomy for RCC 08/03/2023   History of pulmonary embolism 08/03/2023   History of CVA (cerebrovascular accident) 08/03/2023   Dementia (HCC)    Chronic anticoagulation due to history of pulmonary embolism 05/25/2017   CVA (cerebral vascular accident) (HCC) 05/25/2017   Hyperlipidemia 05/25/2017   Obesity (BMI 30.0-34.9) 10/22/2016   Type 2 diabetes mellitus without complication, without long-term current use of insulin  (HCC)  11/14/2014   Acquired hypothyroidism 02/19/2014   Allergic rhinitis 02/19/2014   Neuropathy 02/19/2014   Primary osteoarthritis of both knees 02/19/2014   Severe episode of recurrent major depressive disorder, without psychotic features (HCC) 02/19/2014   Vitamin B 12 deficiency 02/19/2014   Vitamin D  deficiency 02/19/2014   PE (pulmonary thromboembolism) (HCC) 10/03/2012   Malignant neoplasm of kidney (HCC) 03/21/2012   Gallstones without obstruction of gallbladder 05/08/2009   Sleep apnea 07/05/2000   Essential hypertension 12/06/1996    Palliative Care Assessment & Plan   Recommendations/Plan: Continue current care   Code Status:    Code Status Orders  (From admission, onward)           Start     Ordered   04/27/24 2311  Full code  Continuous       Question:  By:  Answer:  Consent: discussion documented in EHR   04/27/24 2311           Code Status History     Date Active Date Inactive Code Status Order ID Comments User Context   10/06/2023 2250 10/11/2023 1912 Full Code [id number]  Cleatus Delayne GAILS, MD ED   08/03/2023 0403 08/05/2023 2021 Full Code [id number]  Cleatus Delayne GAILS, MD ED      Advance Directive Documentation    Flowsheet Row Most Recent Value  Type of Advance Directive Healthcare Power of Attorney  Pre-existing out of facility DNR order (yellow form or pink MOST form) --  MOST Form in Place? --    Camelia Lewis, NP  Please contact Palliative Medicine Team phone at [phone] for questions and concerns.

## 2024-05-17 NOTE — TOC Progression Note (Addendum)
 Transition of Care [facility]) - Progression Note    Patient Details  Name: Kylie [name] MRN: [id number] Date of Birth: November 29, 1955  Transition of [facility]) CM/SW Contact  Alvaro Louder, [place] Phone Number: 05/17/2024, 10:36 AM  Clinical Narrative:  3:52 PM Patient does not qualify for LTAC. LCSWA to follow suggestions by MD. TPN is still active Patient is not medically ready.   LCSWA reached out LTAC representative to see if she would qualify. Patient is still not medically ready at the moment for the next level of care.   TOC to follow for discharge                       Expected Discharge Plan and Services                                               Social Drivers of Health (SDOH) Interventions SDOH Screenings   Food Insecurity: Patient Unable To Answer (04/28/2024)  Housing: Unknown (04/28/2024)  Transportation Needs: Patient Unable To Answer (04/28/2024)  Utilities: Patient Unable To Answer (04/28/2024)  Financial Resource Strain: Low Risk (09/05/2020)   Received from [facility]  Physical Activity: Inactive (03/04/2021)   Received from [facility]  Social Connections: Patient Unable To Answer (04/28/2024)  Tobacco Use: Low Risk  (05/03/2024)  Health Literacy: High Risk (01/21/2021)   Received from [facility]    Readmission Risk Interventions     No data to display

## 2024-05-17 NOTE — Progress Notes (Signed)
 Nutrition Follow-up  DOCUMENTATION CODES:   Non-severe (moderate) malnutrition in context of chronic illness  INTERVENTION:   -48 hour calorie count -Continue TPN management per pharmacy  -Monitor Mg, K, and Phos and replete as needed secondary to high refeeding risk -Continue daily weights -Continue soft diet -Continue feeding assistance with meals  -Continue Ensure Plus High Protein po TID, each supplement provides 350 kcal and 20 grams of protein  -Continue Magic cup TID with meals, each supplement provides 290 kcal and 9 grams of protein   NUTRITION DIAGNOSIS:   Moderate Malnutrition related to chronic illness as evidenced by moderate fat depletion, moderate muscle depletion.  Ongoing  GOAL:   Patient will meet greater than or equal to 90% of their needs  Met with TPN  MONITOR:   Diet advancement, Labs, Weight trends, Skin, I & O's, Other (Comment) (TPN)  REASON FOR ASSESSMENT:   Consult New TPN/TNA  ASSESSMENT:   68 y.o. female with h/o dementia, HTN, HLD, CHF, CVA, CKD-3a, depression, DM, PE on Xarelto , RCC s/p right nephrectomy (2009), hypothryoidism, CAD and OSA who is admitted with SBO secondary to Internal hernia resulting in small bowel ischemia and necrosis now s/p exploratory laparotomy, reduction of internal hernia, small bowel resection with cecectomy (~110cm ileum along with IC valve) and ileocolic anastomosis 10/31.  10/31- s/p s/p exploratory laparotomy with small bowel resection, ileocecectomy, ileocolonic anastomosis and reduction of internal hernia, PICC placed, TPN initiated 11/1- NGT removed by patient, pulled out PICC line 11/2- PICC line replaced 11/3- IV thiamine  ordered 11/6- advanced to clear liquid diet 11/7- advanced to full liquid diet, penrose drain removed 11/11- advanced to soft diet, TPN at half rate  11/13- downgraded to clear liquid diet, calorie count completed (meeting 4% of needs) 11/14- TPN advanced to full rate. Patient  refused staple removal, PICC replaced 11/15- KUB consistent with ileus 11/17- advanced to full liquid diet 11/19- advanced to full liquid diet, calorie count initiated  Reviewed I/O's: +3 L x 24 hours and +16 L since 05/03/24  Per general surgery notes, patient continues to have bowel function. She has been hospital and refusing care at times. Plan to advance to a soft diet to monitor tolerance and adequacy of intake in order to determine if patient can come off TPN.   Patient remains on full rate of TPN secondary to poor oral intake. TPN infusing at 75 ml/hr, which provides 2005 kcals and 108 grams protein, which provides 100% of estimated kcal and protein needs.   Patient sitting up in bed at time of visit. Patient smiling and remembers RD from prior visits (where have you been?). Patient reports feeling better today. She states she has been eating, however, when asked what she was eating, she continued to repeat I've been eating cheeseburgers. Sitter at bedside confirmed that this statement is not accurate as she was on a full liquid diet yesterday and breakfast tray has not arrived just yet. She did drink a few sips of Ensure this morning.   RD discussed importance of good meal and supplement intake to promote healing. Patient states she will do better and try to eat and drink supplements.   ADDENDUM (1335): Case discussed with RN and sitter; patient consumed only 10% of breakfast this morning (68 kcals and 2 grams protein). Per sitter, lunch has just arrived, but patient is not very enthusiastic about eating. Sitter setting up tray and encouraging patient to eat.   Reviewed weights. No weight loss noted since admission. Noted weight  trends have ranged from 77.3 kg- 86.2 kg. No output documented over the past 24 hours, so unsure of accuracy of I/O's at this time.   TOC reaching out for potential LTACH eligibility. Noted patient from [facility] PTA. Per palliative care notes, plan  for full code and full scope care at this time. Family does not want to place PEG, but amenable to LTACH to continue TPN.   Medications reviewed and include megace, melatonin, miralax , and senokot.   Labs reviewed: CBGS: 102-145 (inpatient orders for glycemic control are 0-6 units insulin  aspart every 4 hours).    Diet Order:   Diet Order             DIET SOFT Fluid consistency: Thin  Diet effective now                   EDUCATION NEEDS:   Not appropriate for education at this time  Skin:  Skin Assessment: Skin Integrity Issues: Skin Integrity Issues:: Incisions Incisions: closed abdomen  Last BM:  05/17/24 (type 6)  Height:   Ht Readings from Last 1 Encounters:  04/28/24 5' 10 (1.778 m)    Weight:   Wt Readings from Last 1 Encounters:  05/17/24 83.4 kg    Ideal Body Weight:  68.1 kg  BMI:  Body mass index is 26.38 kg/m.  Estimated Nutritional Needs:   Kcal:  2000-2300kcal/day  Protein:  100-115g/day  Fluid:  1.8-2.1L/day    Margery ORN, RD, LDN, CDCES Registered Dietitian III Certified Diabetes Care and Education Specialist If unable to reach this RD, please use RD Inpatient group chat on secure chat between hours of 8am-4 pm daily

## 2024-05-17 NOTE — Progress Notes (Signed)
 OT Cancellation Note  Patient Details Name: Kylie [name] MRN: [id number] DOB: [birth year]   Cancelled Treatment:    Reason Eval/Treat Not Completed: Patient declined, no reason specified;Other (comment) (patient initially reporting she is fine, when therapy initiated she adamently refused and became agitated. refused bed level exercise, sitting EOB or toileting.)  Maryelizabeth CHRISTELLA Clause 05/17/2024, 3:58 PM

## 2024-05-17 NOTE — Progress Notes (Signed)
 PHARMACY - TOTAL PARENTERAL NUTRITION CONSULT NOTE   Indication: Small bowel obstruction  Patient Measurements: Height: 5' 10 (177.8 cm) Weight: 83.4 kg (183 lb 13.8 oz) IBW/kg (Calculated) : 68.5 TPN AdjBW (KG): 79.6 Body mass index is 26.38 kg/m. Usual Weight: 75.8 kg (02/07/24)  Assessment:  Kylie [name] is a 68yoF that presented with upper abdominal pain. Patient's past medical history notable for dementia, HTN, HLD, sCHF with EF 40-45% (10/08/23), CVA, CKD-3a, depression, DM, PE on apixaban , and s/p right nephrectomy in 2009 for right renal cell carcinoma.  Glucose / Insulin : BG 105-118. (No SSI used in last 24 hours)  Electrolytes: K 4.5, Mag 2.0, Phos 3.7, Corrected Ca 9.5 Renal: Scr 0.79>0.74>0.8 Hepatic: Alk phos 82, AST 14, ALT 22, total bili 0.2, albumin 2.6 Intake / Output; MIVF: Net since admission: (+) 15.9L GI Imaging: 10/30 CT Angio Impression: Closed loop small bowel obstruction likely secondary to an internal hernia with findings concerning for developing bowel ischemia. Clinical correlation and surgical consult is advised. Status post right nephrectomy. 11/5 CTABD: Dilated small-bowel loops up to 4.3 cm without focal obstruction, consistent with ileus. Small amount of free fluid. 11/13 Abd XR: Persistent small bowel distention suggestive of continued post-op ileus. GI Surgeries / Procedures:  10/31: Exploratory Laparotomy, reduction of internal hernia, small bowel resection with cecectomy (length 110 cm), ileocolic anastomosis  ID [email or web address]  10/31 >> 11/7  Central access: yes TPN start date: 04/28/2024  Nutritional Goals: Goal TPN rate is 75 mL/hr (provides 108 g of protein and 2005 kcals per day)  RD Assessment: Estimated Needs Total Energy Estimated Needs: 2000-2300kcal/day Total Protein Estimated Needs: 100-115g/day Total Fluid Estimated Needs: 1.8-2.1L/day  Current Nutrition:  Full liquid 11/7 >> soft foods 11/11 >> clear liquids 11/13 >> full liquids  11/17  Plan:  Per surgery, patient having bowel function but not eating much Continue TPN to 75 ml/hr (goal rate) at 1800 Electrolytes in TPN (standard): Na 50mEq/L, K 50mEq/L, Ca 58mEq/L, Mg 77mEq/L, and Phos 15mmol/L Cl:Ac 1:1 (watch) Add standard MVI and trace elements to TPN Thiamine  100mg  IV daily x 7 days per dietician (completed) Continue Sensitive  q6h SSI and adjust as needed  Monitor TPN labs on Mon/Thurs - check potassium in AM  Thank you for involving pharmacy in this patient's care.   Damien Napoleon, PharmD Clinical Pharmacist 05/17/2024 8:33 AM

## 2024-05-17 NOTE — Progress Notes (Signed)
 [facility] SURGICAL PROGRESS NOTE  Hospital Day(s): 20.   Post op day(s): 20 Days Post-Op.   Interval History:  Attempted to see patient this morning. Immediately upon entering her room she covered herself with blankets and began to tell me to get the fuck out of her room. I tried to redirect her and introduce myself as the surgery PA that has been helping with her care and she replied you're not taking shit out of me. I made an effort to illicit what was wrong this morning however she continued to tell me to get the hell out of her room. Safety sitter was at bedside. Unable to get any reliable history this AM secondary to the above.She is on FLD. Multiple BM have been recorded. She is on TPN.   Vital signs in last 24 hours: [min-max] current  Temp:  [97.5 F (36.4 C)-98.9 F (37.2 C)] 98.5 F (36.9 C) (11/19 0742) Pulse Rate:  [67-95] 78 (11/19 0742) Resp:  [17-20] 18 (11/19 0742) BP: (103-146)/(59-83) 116/63 (11/19 0742) SpO2:  [99 %-100 %] 99 % (11/19 0742) Weight:  [83.4 kg] 83.4 kg (11/19 0500)     Height: 5' 10 (177.8 cm) Weight: 83.4 kg BMI (Calculated): 26.38   Intake/Output last 2 shifts:  11/18 0701 - 11/19 0700 In: 3038.5 [P.O.:1004; I.V.:2034.5] Out: -    Physical Exam:  Constitutional: alert, agitated, hostile, uncooperative this AM Respiratory: breathing non-labored at rest  Gastrointestinal: Unable to exam patient this morning seocnadry to patient hostility, uncooperative  Integumentary: Unable to exam patient this morning seocnadry to patient hostility, uncooperative   Labs:     Latest Ref Rng & Units 05/13/2024    6:30 AM 05/12/2024    3:36 AM 05/11/2024    5:53 AM  CBC  WBC 4.0 - 10.5 K/uL 10.8  10.2  12.6   Hemoglobin 12.0 - 15.0 g/dL 8.0  7.7  8.0   Hematocrit 36.0 - 46.0 % 24.8  23.3  24.5   Platelets 150 - 400 K/uL 354  317  302       Latest Ref Rng & Units 05/16/2024    6:08 AM 05/15/2024    4:01 AM 05/13/2024    6:30 AM   CMP  Glucose 70 - 99 mg/dL  891  881   BUN 8 - 23 mg/dL  28  22   Creatinine 9.55 - 1.00 mg/dL  9.19  9.25   Sodium 864 - 145 mmol/L  140  140   Potassium 3.5 - 5.1 mmol/L 4.5  4.8  4.4   Chloride 98 - 111 mmol/L  114  113   CO2 22 - 32 mmol/L  21  22   Calcium  8.9 - 10.3 mg/dL  8.4  8.3   Total Protein 6.5 - 8.1 g/dL  5.0    Total Bilirubin 0.0 - 1.2 mg/dL  0.2    Alkaline Phos 38 - 126 U/L  82    AST 15 - 41 U/L  14    ALT 0 - 44 U/L  22       Imaging studies: No new pertinent imaging studies   Assessment/Plan:  68 y.o. female with failure to thrive 20 Days Post-Op s/p exploratory laparotomy with small bowel resection, ileocecectomy, ileocolonic anastomosis and reduction of internal hernia    - Patient this morning is quite hostile and uncooperative, unable to obtain reliable history nor examination. It does seem on chart review she continues to have bowel function (7 BMs recorded).  I do think we can again trial advancement to a soft diet and monitor tolerance. Her biggest barriers continue to be nutritional status and overall failure to thrive/dementia.  - Appreciate palliative discussions; considering LTAC with TPN if amenable/accepted.    - Continue TPN for now until more certain she can meet caloric needs with PO intake alone - Monitor abdominal examination; on-going bowel function   - Okay to continue to work with therapies   - Further management per primary service    All of the above findings and recommendations were discussed with the medical team.   -- Arthea Platt, PA-C [facility] 05/17/2024, 7:56 AM M-F: 7am - 4pm

## 2024-05-17 NOTE — Progress Notes (Signed)
 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 Complaints of diffuse severe abdominal pain of unknown duration and hypertensive crisis. CTA C/A/P showed closed-loop SBO with developing ischemia. She received Kcentra for DOAC reversal and underwent emergent ex lap on 04/27/2024 with reduction of internal hernia, resection of approximately 110 cm distal small bowel with ileocecectomy, and ileocolic anastomosis. Status post transfusion of 1 unit pRBC on 11/3 for Hgb 6.4. Course complicated by postop ileus as of 11/05. IV Zosyn was stopped on 11/07 after 8 days JP drain removed on 11/08. Had hematochezia x 7 episodes on 11/9-11/10 AM with Hgb drop to nadir of 5.4. Transfused 2 units pRBC. Surgery aware, suspect bleeding at anastomosis site, inaccessible endoscopically. Has been on TPN and recently started on po diet but her intake has been poor. Has pulled PICC line few times now. Persistent Ileus and abd pain worse 11/13 and persistent into 11/16, have had to deescalate diet back to clears. 11/17 trial advance to full liquids. Pt still not moving around much and not taking much po into 11/18 and remains on TPN. TOC looking into LTACH       Assessment and Plan:  # Ischemic bowel status post ex lap with SB resection plus ileocecectomy and ileocolic anastomosis (10/30) # Course complicated by post-op ileus (11/05) # Persistent/recurrent ileus (KUB 11/13, 11/15) Patient is status post exploratory laparotomy with small bowel resection plus ileocecectomy and ileocolic anastomosis Hospital course has been complicated  by ileus and blood loss anemia from a GI source Patient has continued to refuse care and will not let the surgeon examine her over the last 1 week Was started on TPN due to poor oral intake.  Was initially started on a clear liquid diet and advance to full liquid on 11/17 Had 2 bowel movements on 05/16/24 and her diet has been advanced to soft solid and surgery Continue MiraLAX  daily Continue TPN Dietitian consult for calorie count    # Acute blood loss anemia - resolved # Hematochezia - resolved  Patient is status post transfusion of packed RBCs H&H is stable   # History of PE s/p IVC filter Eliquis held due to hematochezia and acute blood loss anemia requiring blood transfusion Continue to monitor H&H and will resume as needed   # Hypertensive urgency - resolved # Primary hypertension - stable  Continue amlodipine  and Avapro    # AKI on CKD 3a - resolved Monitor BMP    #Chronic 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 Not oriented to situation.  Complicates her care - has pulled PICC several times which is needed for TPN at this point. She's needing sitter. Surely will pull a feeding tube. Will discuss further with family. If dementia is limiting her ability to receive  safe care / if treatments are causing distress, would deescalate to focus on treating as able and prioritizing QOL   # GOC Patient has no insight into the severity of her medical condition. Has no capacity to make her own medical decisions Decision-maker(s): Pt's cousin [name] is usually at bedside - he is her financial POA and pt's brother Koren is her  HCPOA. If we are unable to reach Koren, he assigns [employer] as education officer, environmental and he asks we do NOT involve patient's children so I have inactivated them in her contacts per his request.  goal to wean off TPN and advance diet - if not able to accomplish this such that pt is meeting nutritional requirements, then would need to talk about alternative plan and likely hospice Not a good candidate for feeding tube  D/w [name] and Koren (by phone in the room) at bedside 11/13 - confirmed for FULL CODE, I did advise that in situations of severe weakness/malnourishment, I strongly recommend against CPR. I do not believe patient is actively dying at this point, my greatest concern is if she is not improving her po intake then she will certainly continue to decline. Time will tell. Koren and [name] state that when she was lucid years ago she mentioned ~NOT~ wanting to be a DNR so they are going by her wishes, but are open to revisit the conversation if she is getting worse / has terminal illness.  See above re dementia        overweight based on BMI: Body mass index is 27.14 kg/m.SABRA Significantly low or high BMI is associated with higher medical risk.  Underweight - under 18  overweight - 25 to 29 obese - 30 or more Class 1 obesity: BMI of 30.0 to 34 Class 2 obesity: BMI of 35.0 to 39 Class 3 obesity: BMI of 40.0 to 49 Super Morbid Obesity: BMI 50-59 Super-super Morbid Obesity: BMI 60+ Healthy nutrition and physical activity advised as adjunct to other disease management and risk reduction treatments             Subjective: Seen and examined at the bedside and complains of abdominal pain which appears to be mostly around her incision  Physical Exam: Vitals:   05/17/24 0500 05/17/24 0742 05/17/24 1105 05/17/24 1243  BP:  116/63 (!) 105/50 (!) 106/58  Pulse:  78 80 83  Resp:  18 18   Temp:  98.5 F (36.9 C) 98.3 F (36.8 C)   TempSrc:  Oral Oral   SpO2:  99% 100% 100%  Weight: 83.4 kg     Height:         Constitutional:      General: She is not in acute distress. Cardiovascular:     Rate and Rhythm: Normal rate and regular rhythm.     Heart sounds: Murmur heard.  Pulmonary:     Effort: Pulmonary effort is normal.     Breath sounds: Normal breath sounds.  Abdominal:     General: Bowel sounds are normal.     Palpations: Abdomen is soft.     Tenderness: Bowel sounds present, tender around incision, soft, nondistended Neurological:     Mental Status: She is alert. She is disoriented.  Psychiatric:        Mood and Affect: Mood normal.        Behavior: Behavior normal.          Data Reviewed: Labs reviewed.  Hemoglobin 8.0 Labs reviewed  Family Communication: Called and discussed patient's condition  with Mr. [name] over the phone.  All questions and concerns have been addressed.  Disposition: Status is: Inpatient Remains inpatient appropriate because: Nutritional status is still not back to baseline  Planned Discharge Destination: TBD    Time spent: 50 minutes  Author: Aimee Somerset, MD 05/17/2024 4:21 PM  For on call review [email or web address].

## 2024-05-17 NOTE — Plan of Care (Signed)
  Problem: Coping: Goal: Ability to adjust to condition or change in health will improve Outcome: Progressing   Problem: Elimination: Goal: Will not experience complications related to bowel motility Outcome: Progressing Goal: Will not experience complications related to urinary retention Outcome: Progressing   Problem: Safety: Goal: Ability to remain free from injury will improve Outcome: Progressing

## 2024-05-18 DIAGNOSIS — Z7189 Other specified counseling: Secondary | ICD-10-CM | POA: Diagnosis not present

## 2024-05-18 DIAGNOSIS — K559 Vascular disorder of intestine, unspecified: Secondary | ICD-10-CM | POA: Diagnosis not present

## 2024-05-18 LAB — COMPREHENSIVE METABOLIC PANEL WITH GFR
ALT: 20 U/L (ref 0–44)
AST: 20 U/L (ref 15–41)
Albumin: 2.9 g/dL — ABNORMAL LOW (ref 3.5–5.0)
Alkaline Phosphatase: 83 U/L (ref 38–126)
Anion gap: 8 (ref 5–15)
BUN: 38 mg/dL — ABNORMAL HIGH (ref 8–23)
CO2: 18 mmol/L — ABNORMAL LOW (ref 22–32)
Calcium: 8.3 mg/dL — ABNORMAL LOW (ref 8.9–10.3)
Chloride: 113 mmol/L — ABNORMAL HIGH (ref 98–111)
Creatinine, Ser: 0.81 mg/dL (ref 0.44–1.00)
GFR, Estimated: >60 mL/min (ref >60)
Glucose, Bld: 103 mg/dL — ABNORMAL HIGH (ref 70–99)
Potassium: 4.6 mmol/L (ref 3.5–5.1)
Sodium: 138 mmol/L (ref 135–145)
Total Bilirubin: <0.2 mg/dL (ref 0.0–1.2)
Total Protein: 5.6 g/dL — ABNORMAL LOW (ref 6.5–8.1)

## 2024-05-18 LAB — PHOSPHORUS: Phosphorus: 4 mg/dL (ref 2.5–4.6)

## 2024-05-18 LAB — TROPONIN T, HIGH SENSITIVITY: Troponin T High Sensitivity: 43 ng/L — ABNORMAL HIGH (ref 0–19)

## 2024-05-18 LAB — GLUCOSE, CAPILLARY
Glucose-Capillary: 103 mg/dL — ABNORMAL HIGH (ref 70–99)
Glucose-Capillary: 104 mg/dL — ABNORMAL HIGH (ref 70–99)
Glucose-Capillary: 108 mg/dL — ABNORMAL HIGH (ref 70–99)
Glucose-Capillary: 112 mg/dL — ABNORMAL HIGH (ref 70–99)
Glucose-Capillary: 114 mg/dL — ABNORMAL HIGH (ref 70–99)

## 2024-05-18 LAB — MAGNESIUM: Magnesium: 2 mg/dL (ref 1.7–2.4)

## 2024-05-18 MED ORDER — TRACE MINERALS CU-MN-SE-ZN 300-55-60-3000 MCG/ML IV SOLN
INTRAVENOUS | Status: DC
  Filled 2024-05-18: qty 720

## 2024-05-18 NOTE — Progress Notes (Signed)
 PHARMACY - TOTAL PARENTERAL NUTRITION CONSULT NOTE   Indication: Small bowel obstruction  Patient Measurements: Height: 5' 10 (177.8 cm) Weight: 86.7 kg (191 lb 2.2 oz) IBW/kg (Calculated) : 68.5 TPN AdjBW (KG): 79.6 Body mass index is 27.43 kg/m. Usual Weight: 75.8 kg (02/07/24)  Assessment:  Kylie [name] is a 68yoF that presented with upper abdominal pain. Patient's past medical history notable for dementia, HTN, HLD, sCHF with EF 40-45% (10/08/23), CVA, CKD-3a, depression, DM, PE on apixaban, and s/p right nephrectomy in 2009 for right renal cell carcinoma.  Glucose / Insulin : BG 103-127. (No SSI used in last 24 hours)  Electrolytes: K 4.6, Mag 2.0, Phos 4.0, Corrected Ca 9.2 Renal: Scr 0.74>0.80>0.81 Hepatic: Alk phos 83, AST 20, ALT 20, total bili <0.2, albumin 2.9 Intake / Output; MIVF: Net since admission: (+) 14.8L GI Imaging: 10/30 CT Angio Impression: Closed loop small bowel obstruction likely secondary to an internal hernia with findings concerning for developing bowel ischemia. Clinical correlation and surgical consult is advised. Status post right nephrectomy. 11/5 CTABD: Dilated small-bowel loops up to 4.3 cm without focal obstruction, consistent with ileus. Small amount of free fluid. 11/13 Abd XR: Persistent small bowel distention suggestive of continued post-op ileus. 11/18 Adb XR: No significant change in mildly dilated loops of small bowel in the left abdomen, likely postoperative ileus. GI Surgeries / Procedures:  10/31: Exploratory Laparotomy, reduction of internal hernia, small bowel resection with cecectomy (length 110 cm), ileocolic anastomosis  ID Zosyn 10/31 >> 11/7  Central access: yes TPN start date: 04/28/2024  Nutritional Goals: Goal TPN rate is 75 mL/hr (provides 108 g of protein and 2005 kcals per day)  RD Assessment: Estimated Needs Total Energy Estimated Needs: 2000-2300kcal/day Total Protein Estimated Needs: 100-115g/day Total Fluid  Estimated Needs: 1.8-2.1L/day  Current Nutrition:  Full liquid 11/7 >> soft foods 11/11 >> clear liquids 11/13 >> full liquids 11/17 >> Soft foods 11/19  Plan:  Per surgery, patient having bowel function but not eating much Continue TPN to 75 ml/hr (goal rate) at 1800 Electrolytes in TPN (standard): Na 50mEq/L, K 50mEq/L, Ca 72mEq/L, Mg 52mEq/L, and Phos 15mmol/L Cl:Ac 1:1 (watch) Add standard MVI and trace elements to TPN Thiamine 100mg  IV daily x 7 days per dietician (completed) Continue Sensitive  q6h SSI and adjust as needed  Monitor TPN labs on Mon/Thurs - check potassium in AM  Thank you for involving pharmacy in this patient's care.   Damien Napoleon, PharmD Clinical Pharmacist 05/18/2024 10:21 AM

## 2024-05-18 NOTE — Progress Notes (Signed)
 Daily Progress Note   Patient Name: Kylie [name]       Date: 05/18/2024 DOB: 08-04-1955  Age: 68 y.o. MRN#: [id number] Attending Physician: Lanetta Lingo, MD Primary Care Physician: Care, [facility] Admit Date: 04/27/2024  Reason for Consultation/Follow-up: Establishing goals of care  Subjective: Notes and labs reviewed.  In to see patient.  She is currently resting in bed at this time watching television with sitter at bedside.  She denies complaint at this time.  PT Galen in to work with patient.  Patient immediately turned her head and refused to work with him.  PT and I attempted to discuss the importance of getting out of bed and being active but patient continues to refuse.  Sitter states she is had knee pain.  Ongoing group chat with attending, surgery service, nursing, social work, RD, and others.  Discussed her care to this point, and today's updates.  Per surgery service, there is no female Ortho or general surgeons available.  Per RD, with addition of appetite stimulant, patient continues to eat bites and sips.  Called to speak with cousin Kylie [name].  Discussed the information above and concerns for care moving forward.  He states he understands the situation, and would like for her to return to [facility] with pace/hospice to follow, focusing on comfort and dignity.  He states he will have availability tomorrow, and will plan to come to the hospital between 11-1 where he will be available to complete next steps and discuss further. He states he will plan on having patient's brother Kylie [name] available as well during that time.  He states he would like to change everything including CODE STATUS tomorrow after conversation.   Length of Stay: 21  Current Medications: Scheduled  Meds:   sodium chloride    Intravenous Once   sodium chloride    Intravenous Once   acetaminophen   1,000 mg Oral Q8H   Chlorhexidine  Gluconate Cloth  6 each Topical Daily   cloNIDine   0.1 mg Transdermal Weekly   feeding supplement  237 mL Oral TID BM   insulin  aspart  0-6 Units Subcutaneous Q6H   levothyroxine   100 mcg Oral q morning   megestrol   40 mg Oral Daily   melatonin  5 mg Oral QHS   polyethylene glycol  17 g Oral Daily   senna-docusate  2 tablet Oral QHS   sodium chloride  flush  10-40 mL Intracatheter Q12H    Continuous Infusions:  TPN ADULT (ION) 75 mL/hr at 05/17/24 1850   TPN ADULT (ION)      PRN Meds: LORazepam, OLANZapine  zydis, mouth rinse, oxyCODONE, sodium chloride  flush, ziprasidone  Physical Exam Pulmonary:     Effort: Pulmonary effort is normal.  Neurological:     Mental Status: She is alert.             Vital Signs: BP (!) 109/57 (BP Location: Right Arm)   Pulse 86   Temp 98.7 F (37.1 C) (Oral)   Resp 17   Ht 5' 10 (1.778 m)   Wt 86.7 kg   SpO2 100%   BMI 27.43 kg/m  SpO2: SpO2: 100 % O2 Device: O2 Device: Room Air O2 Flow Rate: O2 Flow Rate (L/min): 6 L/min  Intake/output summary:  Intake/Output Summary (Last 24 hours) at 05/18/2024 1449 Last data filed at 05/18/2024 1045 Gross per 24 hour  Intake 240 ml  Output --  Net 240 ml   LBM: Last BM Date : 05/17/24 Baseline Weight: Weight: 79.5 kg Most recent weight: Weight: 86.7 kg   Patient Active Problem List   Diagnosis Date Noted   Goals of care, counseling/discussion 05/17/2024   Malnutrition of moderate degree 04/29/2024   Ischemic bowel disease 04/27/2024   Closed loop obstruction of intestine (HCC) 04/27/2024   Chronic combined systolic and diastolic CHF (congestive heart failure) (HCC) 04/27/2024   Leukocytosis 04/27/2024   HLD (hyperlipidemia) 04/27/2024   CAD (coronary artery disease) 04/27/2024   Depression 04/27/2024   Chronic kidney disease, stage 3a (HCC) 04/27/2024    Hypertensive urgency 04/27/2024   QT prolongation 04/27/2024   Hypokalemia 10/07/2023   Sepsis, severe, due to pneumonia (HCC) 10/06/2023   RSV (respiratory syncytial virus pneumonia) 10/06/2023   Acute respiratory failure with hypoxia (HCC) 10/06/2023   Rhabdomyolysis, traumatic 08/03/2023   Hypoglycemia 08/03/2023   S/p R nephrectomy for RCC 08/03/2023   History of pulmonary embolism 08/03/2023   History of CVA (cerebrovascular accident) 08/03/2023   Dementia (HCC)    Chronic anticoagulation due to history of pulmonary embolism 05/25/2017   CVA (cerebral vascular accident) (HCC) 05/25/2017   Hyperlipidemia 05/25/2017   Obesity (BMI 30.0-34.9) 10/22/2016   Type 2 diabetes mellitus without complication, without long-term current use of insulin  (HCC) 11/14/2014   Acquired hypothyroidism 02/19/2014   Allergic rhinitis 02/19/2014   Neuropathy 02/19/2014   Primary osteoarthritis of both knees 02/19/2014   Severe episode of recurrent major depressive disorder, without psychotic features (HCC) 02/19/2014   Vitamin B 12 deficiency 02/19/2014   Vitamin D  deficiency 02/19/2014   PE (pulmonary thromboembolism) (HCC) 10/03/2012   Malignant neoplasm of kidney (HCC) 03/21/2012   Gallstones without obstruction of gallbladder 05/08/2009   Sleep apnea 07/05/2000   Essential hypertension 12/06/1996    Recommendations/Plan: Cousin plans to come to bedside tomorrow between 11 and 1 to shift the patient from full code and full scope to comfort care.  He states he will have patient's brother available during that time as well. Family would like patient to return to [facility] with hospice/patient to follow. Team updated  Code Status:    Code Status Orders  (From admission, onward)           Start     Ordered   04/27/24 2311  Full code  Continuous       Question:  By:  Answer:  Consent: discussion  documented in EHR   04/27/24 2311           Code Status History     Date Active  Date Inactive Code Status Order ID Comments User Context   10/06/2023 2250 10/11/2023 1912 Full Code [id number]  Cleatus Delayne GAILS, MD ED   08/03/2023 0403 08/05/2023 2021 Full Code [id number]  Cleatus Delayne GAILS, MD ED      Advance Directive Documentation    Flowsheet Row Most Recent Value  Type of Advance Directive Healthcare Power of Attorney  Pre-existing out of facility DNR order (yellow form or pink MOST form) --  MOST Form in Place? --   Camelia Lewis, NP  Please contact Palliative Medicine Team phone at [phone] for questions and concerns.

## 2024-05-18 NOTE — Progress Notes (Signed)
 Sitter at the bedside. No current patient needs noted at this time.

## 2024-05-18 NOTE — Progress Notes (Signed)
 PT Cancellation Note  Patient Details Name: Kylie [name] MRN: [id number] DOB: 1956/04/08   Cancelled Treatment:    Reason Eval/Treat Not Completed: Other (comment) Attempted to see pt this date without success.  Arrived with Dr Signa with attempts to facilitate participation with some activity.  Pt disconcerted from initial entrance to the room and flatly refusing any attempts to encourage activity and/or explanation as to why it is important.  Pt would have none of it and became gradually more resistant and seemingly agitated.  Sitter present and reports that she does consistently seem to do better or at least be less disagreeable with females - will maintain on caseload and attempts to see as pt is appropriate and willing.     Carmin JONELLE Deed, DPT 05/18/2024, 11:24 AM

## 2024-05-18 NOTE — Progress Notes (Signed)
 Occupational Therapy Treatment Patient Details Name: Kylie [name] MRN: [id number] DOB: December 07, 1955 Today's Date: 05/18/2024   History of present illness Pt is a 68 y/o F admitted on 04/27/24 after presenting with c/o abdominal pain. Imaging showed ischemic bowel disease. Pt is s/p ex lap & small bowel resection with cecectomy on 04/27/24. PMH: dementia, HTN, HLD, sCHF, CVA, CKD 3A, depression, DM, PE on xarelto , s/p R nephrectomy (2009) 2/2 renal cell carcinoma   OT comments  Pt is supine in bed on arrival. She had just returned to bed after toileting with sitter. She reports abdominal pain and declines ADLs or further ambulation. Able to perform bed mobility with CGA to supervision. Willing to perform STS and SPT to [facility] while linens were changed requiring CGA and use of RW with cues for safety and hand placement. Pt more agitated this date, sitter present and pt wishing to return to bed. Able to lateral scoot to [facility] with supervision prior to returning to bed. She was left with all needs in place and will cont to require skilled acute OT services to maximize her safety and IND to return to PLOF. Extensive time spent educating her on importance of OOB activity and ambulation to promote healing and strength. Pt verbalized understanding, but reports she is hurting too bad to do anything else.       If plan is discharge home, recommend the following:  A lot of help with bathing/dressing/bathroom;Supervision due to cognitive status;A little help with bathing/dressing/bathroom;A little help with walking and/or transfers   Equipment Recommendations  Other (comment) (defer to next venue)    Recommendations for Other Services      Precautions / Restrictions Precautions Precautions: Fall Recall of Precautions/Restrictions: Impaired Precaution/Restrictions Comments: log roll for comfort 2/2 abdominal incision Restrictions Weight Bearing Restrictions Per Provider Order: No       Mobility Bed  Mobility Overal bed mobility: Needs Assistance Bed Mobility: Rolling, Sidelying to Sit Rolling: Contact guard assist Sidelying to sit: Contact guard assist   Sit to supine: Supervision        Transfers Overall transfer level: Needs assistance Equipment used: Rolling [place]rain (2 wheels) Transfers: Sit to/from Stand, Bed to chair/wheelchair/BSC Sit to Stand: Contact guard assist Stand pivot transfers: Contact guard assist         General transfer comment: cues for hand placement and safety, refused further mobility     Balance Overall balance assessment: Needs assistance Sitting-balance support: No upper extremity supported, Feet supported Sitting balance-Leahy Scale: Good     Standing balance support: Bilateral upper extremity supported, During functional activity, Reliant on assistive device for balance Standing balance-Leahy Scale: Fair Standing balance comment: RW use and CGA                           ADL either performed or assessed with clinical judgement   ADL Overall ADL's : Needs assistance/impaired                         Toilet Transfer: Rolling Police (2 wheels);BSC/3in1;Contact guard assist;Stand-pivot Statistician Details (indicate cue type and reason): cues for safety and technique           General ADL Comments: declined further mobility or ADLs this session, more agitated and stating I'm hurting I'm not doing it willing to transfer to [facility] and back to bed while linens changed    Extremity/Trunk Assessment  Vision       Perception     Praxis     Communication Communication Communication: Impaired Factors Affecting Communication: Reduced clarity of speech   Cognition Arousal: Alert Behavior During Therapy: Agitated Cognition: History of cognitive impairments, No family/caregiver present to determine baseline, Cognition impaired                               Following commands:  Impaired Following commands impaired: Follows one step commands with increased time, Follows one step commands inconsistently      Cueing   Cueing Techniques: Verbal cues, Tactile cues  Exercises Other Exercises Other Exercises: Extensive education on importance of ambulation and activity to maximize strength and prevent further weakness.    Shoulder Instructions       General Comments PICC line intact pre/post session; more agitated this date, c/o pain    Pertinent Vitals/ Pain       Pain Assessment Pain Assessment: Faces Faces Pain Scale: Hurts little more Pain Location: abdomen Pain Descriptors / Indicators: Aching, Discomfort, Grimacing, Moaning Pain Intervention(s): Monitored during session, Repositioned, Limited activity within patient's tolerance  Home Living                                          Prior Functioning/Environment              Frequency  Min 2X/week        Progress Toward Goals  OT Goals(current goals can now be found in the care plan section)  Progress towards OT goals: Progressing toward goals  Acute Rehab OT Goals OT Goal Formulation: With patient Time For Goal Achievement: 05/25/24 Potential to Achieve Goals: Good  Plan      Co-evaluation                 AM-PAC OT 6 Clicks Daily Activity     Outcome Measure   Help from another person eating meals?: A Little Help from another person taking care of personal grooming?: A Little Help from another person toileting, which includes using toliet, bedpan, or urinal?: A Little Help from another person bathing (including washing, rinsing, drying)?: A Lot Help from another person to put on and taking off regular upper body clothing?: A Little Help from another person to put on and taking off regular lower body clothing?: A Lot 6 Click Score: 16    End of Session Equipment Utilized During Treatment: Rolling Mares (2 wheels);Gait belt  OT Visit Diagnosis:  Muscle weakness (generalized) (M62.81);Other abnormalities of gait and mobility (R26.89);Pain   Activity Tolerance Patient tolerated treatment well   Patient Left in bed;with call bell/phone within reach;with bed alarm set;with nursing/sitter in room   Nurse Communication Mobility status        Time: 8872-8853 OT Time Calculation (min): 19 min  Charges: OT General Charges $OT Visit: 1 Visit OT Treatments $Therapeutic Activity: 8-22 mins  Bosco Paparella Chrismon, OTR/L  05/18/24, 4:09 PM   [place] Kenley E Chrismon 05/18/2024, 4:06 PM
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
## 2024-05-18 NOTE — Progress Notes (Addendum)
 Nutrition Follow-up  DOCUMENTATION CODES:   Non-severe (moderate) malnutrition in context of chronic illness  INTERVENTION:   -Continue 48 hour calorie count -Continue TPN management per pharmacy  -Monitor Mg, K, and Phos and replete as needed secondary to high refeeding risk -Continue daily weights -Continue soft diet -Continue feeding assistance with meals  -Continue Ensure Plus High Protein po TID, each supplement provides 350 kcal and 20 grams of protein  -Continue Magic cup TID with meals, each supplement provides 290 kcal and 9 grams of protein  -Discussed calorie count results with team; patient is refusing care at times and refuses all care from female providers. Patient intake has not improved since last week's calorie count and she is not making progress to meet her nutritional needs via PO route. Family does not want PEG and TPN is not a great long term option for her. Palliative care to reach out to family again to discuss goals of care. At this point, there are no further interventions that RD would be able to offer.   NUTRITION DIAGNOSIS:   Moderate Malnutrition related to chronic illness as evidenced by moderate fat depletion, moderate muscle depletion.  Ongoing  GOAL:   Patient will meet greater than or equal to 90% of their needs  Met with TPN  MONITOR:   Diet advancement, Labs, Weight trends, Skin, I & O's, Other (Comment) (TPN)  REASON FOR ASSESSMENT:   Consult New TPN/TNA  ASSESSMENT:   68 y.o. female with h/o dementia, HTN, HLD, CHF, CVA, CKD-3a, depression, DM, PE on Xarelto , RCC s/p right nephrectomy (2009), hypothryoidism, CAD and OSA who is admitted with SBO secondary to Internal hernia resulting in small bowel ischemia and necrosis now s/p exploratory laparotomy, reduction of internal hernia, small bowel resection with cecectomy (~110cm ileum along with IC valve) and ileocolic anastomosis 10/31.  10/31- s/p s/p exploratory laparotomy with small bowel  resection, ileocecectomy, ileocolonic anastomosis and reduction of internal hernia, PICC placed, TPN initiated 11/1- NGT removed by patient, pulled out PICC line 11/2- PICC line replaced 11/3- IV thiamine ordered 11/6- advanced to clear liquid diet 11/7- advanced to full liquid diet, penrose drain removed 11/11- advanced to soft diet, TPN at half rate  11/13- downgraded to clear liquid diet, calorie count completed (meeting 4% of needs) 11/14- TPN advanced to full rate. Patient refused staple removal, PICC replaced 11/15- KUB consistent with ileus 11/17- advanced to full liquid diet 11/19- advanced to full liquid diet, calorie count initiated  Reviewed I/O's: +460 ml x 24 hours and +14.9 L since 05/04/24  Patient remains on full rate of TPN secondary to poor oral intake. TPN infusing at 75 ml/hr, which provides 2005 kcals and 108 grams protein, which provides 100% of estimated kcal and protein needs.   11/19 Breakfast: 68 kcals, 2 grams protein Lunch: 36 kcals, 1 gram protein Dinner: 27 kcals, 2 grams protein  Total intake: 131 kcal (7% of minimum estimated needs)  5 grams protein (5% of minimum estimated needs)  Spoke with patient at bedside, who was sitting up in bed at time of visit. Patient states that she is eating, however, sitter disagrees with this statement.   Per discussion with sitter patient consumed very little of breakfast (a few bites, and about 2/3 of Ensure)- 41 kcals and 1 gram protein.   RD discussed importance of good meal and supplement intake to promote healing. Patient reports she is eating and will continue to try to consume her meals and supplements.   Noted Megace  added on 05/15/24 (40 mg daily). Case discussed with pharmacy and MD regarding potential dosing adjustment to appetite stimulant. Per pharmacy, no ideal dose for megace, as side effects increase with increased dosage; pharmacy to titrate dose up.   Case discussed with RN, palliative care, and MD.  Reviewed calorie count results; patient is meeting less of 10% of her nutritional needs. RD will continue calorie count and re-evaluate tomorrow, however, do not anticipate improvement. There has been no improvement in her oral intake since calorie count completed last week. Discussed concern regarding patient being unable to meet nutritional needs via PO route; as TPN and PEG placement are not great options, there is very little that RD can further offer her at this time. Per general surgery, may need to consider hospice.   No weight loss noted over the past 7 days. Noted weights have ranged between 81.5 kg and 86.7 kg.   Per palliative care notes, plan for full code and full scope care at this time. Family does not want to place PEG, but amenable to LTACH to continue TPN.   Per TOC notes, patient does not qualify for LTACH.   Medications reviewed and include megace, melatonin, miralax , and senokot.  Labs reviewed: CBGS: 103-112 (inpatient orders for glycemic control are 0-6 units insulin  aspart every 4 hours).    Diet Order:   Diet Order             DIET SOFT Fluid consistency: Thin  Diet effective now                   EDUCATION NEEDS:   Not appropriate for education at this time  Skin:  Skin Assessment: Skin Integrity Issues: Skin Integrity Issues:: Incisions Incisions: closed abdomen  Last BM:  05/17/24 (type 6)  Height:   Ht Readings from Last 1 Encounters:  04/28/24 5' 10 (1.778 m)    Weight:   Wt Readings from Last 1 Encounters:  05/18/24 86.7 kg    Ideal Body Weight:  68.1 kg  BMI:  Body mass index is 27.43 kg/m.  Estimated Nutritional Needs:   Kcal:  2000-2300kcal/day  Protein:  100-115g/day  Fluid:  1.8-2.1L/day    Margery ORN, RD, LDN, CDCES Registered Dietitian III Certified Diabetes Care and Education Specialist If unable to reach this RD, please use RD Inpatient group chat on secure chat between hours of 8am-4 pm daily

## 2024-05-18 NOTE — Progress Notes (Signed)
 Patient noted to have poor PO intake throughout the day.  Patient is having bites and sips and not enough to calorie count at this time.   Nurse will continue assess throughout shift.

## 2024-05-18 NOTE — Plan of Care (Signed)
  Problem: Education: Goal: Knowledge of General Education information will improve Description: Including pain rating scale, medication(s)/side effects and non-pharmacologic comfort measures Outcome: Progressing   Problem: Health Behavior/Discharge Planning: Goal: Ability to manage health-related needs will improve Outcome: Progressing   Problem: Fluid Volume: Goal: Ability to maintain a balanced intake and output will improve Outcome: Not Progressing   Problem: Nutritional: Goal: Maintenance of adequate nutrition will improve Outcome: Not Progressing Goal: Progress toward achieving an optimal weight will improve Outcome: Not Progressing

## 2024-05-19 DIAGNOSIS — K559 Vascular disorder of intestine, unspecified: Secondary | ICD-10-CM | POA: Diagnosis not present

## 2024-05-19 DIAGNOSIS — Z515 Encounter for palliative care: Secondary | ICD-10-CM

## 2024-05-19 DIAGNOSIS — K56601 Complete intestinal obstruction, unspecified as to cause: Secondary | ICD-10-CM

## 2024-05-19 LAB — GLUCOSE, CAPILLARY
Glucose-Capillary: 99 mg/dL (ref 70–99)
Glucose-Capillary: 107 mg/dL — ABNORMAL HIGH (ref 70–99)

## 2024-05-19 MED ORDER — GLYCOPYRROLATE 1 MG PO TABS: 1.0000 mg | ORAL_TABLET | ORAL | Status: DC | PRN

## 2024-05-19 MED ORDER — GLYCOPYRROLATE 0.2 MG/ML IJ SOLN
0.2000 mg | INTRAMUSCULAR | Status: DC | PRN
Start: 2024-05-19 — End: 2024-05-19

## 2024-05-19 MED ORDER — ONDANSETRON 4 MG PO TBDP: 4.0000 mg | ORAL_TABLET | Freq: Four times a day (QID) | ORAL | Status: DC | PRN

## 2024-05-19 MED ORDER — GLYCOPYRROLATE 0.2 MG/ML IJ SOLN: 0.2000 mg | INTRAMUSCULAR | Status: DC | PRN

## 2024-05-19 MED ORDER — MORPHINE SULFATE 20 MG/5ML PO SOLN: 10.0000 mg | ORAL | 0 refills | Status: AC | PRN

## 2024-05-19 MED ORDER — ONDANSETRON HCL 4 MG/2ML IJ SOLN: 4.0000 mg | Freq: Four times a day (QID) | INTRAMUSCULAR | Status: DC | PRN

## 2024-05-19 MED ORDER — LORAZEPAM 1 MG PO TABS
1.0000 mg | ORAL_TABLET | Freq: Once | ORAL | Status: AC
  Administered 2024-05-19: 1 mg via ORAL
  Filled 2024-05-19: qty 1

## 2024-05-19 MED ORDER — GLYCOPYRROLATE 1 MG PO TABS: 1.0000 mg | ORAL_TABLET | ORAL | 0 refills | Status: AC | PRN

## 2024-05-19 MED ORDER — POLYVINYL ALCOHOL 1.4 % OP SOLN: 1.0000 [drp] | Freq: Four times a day (QID) | OPHTHALMIC | Status: DC | PRN

## 2024-05-19 MED ORDER — BIOTENE DRY MOUTH MT LIQD: 15.0000 mL | OROMUCOSAL | Status: DC | PRN

## 2024-05-19 NOTE — Progress Notes (Signed)
 Nurse attempted to call report to [facility] and no answer to give report.

## 2024-05-19 NOTE — Plan of Care (Signed)
   Problem: Education: Goal: Ability to describe self-care measures that may prevent or decrease complications (Diabetes Survival Skills Education) will improve Outcome: Progressing Goal: Individualized Educational Video(s) Outcome: Progressing   Problem: Coping: Goal: Ability to adjust to condition or change in health will improve Outcome: Progressing

## 2024-05-19 NOTE — Discharge Summary (Signed)
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 Recent Labs  Lab 05/13/24 0630 05/17/24 1312  WBC 10.8* 7.9  HGB 8.0* 8.0*  HCT 24.8* 24.1*  MCV 99.6 97.2  PLT 354 350   Basic Metabolic Panel: Recent Labs  Lab 05/13/24 0630 05/15/24 0401 05/16/24 0608 05/17/24 1312 05/18/24 0437  NA 140 140  --  138 138  K 4.4 4.8 4.5 4.6 4.6  CL 113* 114*  --  111 113*  CO2 22 21*  --  19* 18*  GLUCOSE 118* 108*  --  108* 103*  BUN 22 28*  --  38* 38*  CREATININE 0.74 0.80  --  0.86 0.81  CALCIUM  8.3* 8.4*  --  8.7* 8.3*  MG  --  2.0  --   --  2.0  PHOS  --  3.7  --   --  4.0   Liver Function Tests: Recent Labs  Lab 05/15/24 0401 05/18/24 0437  AST 14* 20  ALT 22 20  ALKPHOS 82 83  BILITOT 0.2 <0.2  PROT 5.0* 5.6*  ALBUMIN 2.6* 2.9*   CBG: Recent Labs  Lab 05/18/24 0802 05/18/24 1148 05/18/24 1720 05/19/24 0112 05/19/24 0543  GLUCAP 104* 114* 108* 99 107*    Discharge time spent: greater than 30 minutes.  Signed: Aimee Somerset, MD [facility] 05/19/2024

## 2024-05-19 NOTE — Progress Notes (Signed)
 Daily Progress Note   Patient Name: Kylie [name]       Date: 05/19/2024 DOB: [birth year]  Age: 68 y.o. 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 Complaints of diffuse severe abdominal pain of unknown duration and hypertensive crisis. CTA C/A/P showed closed-loop SBO with developing ischemia. She received Kcentra  for DOAC reversal and underwent emergent ex lap on 04/27/2024 with reduction of internal hernia, resection of approximately 110 cm distal small bowel with ileocecectomy, and ileocolic anastomosis. Status post transfusion of 1 unit pRBC on 11/3 for Hgb 6.4. Course complicated by postop ileus as of 11/05. IV Zosyn  was stopped on 11/07 after 8 days JP drain removed on 11/08. Had hematochezia x 7 episodes on 11/9-11/10 AM with Hgb drop to nadir of 5.4. Transfused 2 units pRBC. Surgery aware, suspect bleeding at anastomosis site, inaccessible endoscopically. Has been on TPN and recently started on po diet but her intake has been poor. Has pulled PICC line few times now. Persistent Ileus and abd pain worse 11/13 and persistent into 11/16, have had to deescalate diet back to clears. 11/17 trial advance to full liquids. Pt still not moving around much and not taking much po into 11/18 and remains on TPN. TOC looking into LTACH--not a candidate  for LTACH.  Palliative Medicine consulted for assisting with goals of care conversations.  Subjective: Extensive chart review has been completed prior to meeting patient including labs, vital signs, imaging, progress notes, orders, and available advanced directive documents from current and previous encounters.    Visited with Kylie [name] at her bedside.  She is awake, alert and able to engage in conversation.  Cousin-Kylie [name] visiting at bedside.  PACE-NP, Arna also at bedside during time of visit. Tony-brother placed on speaker phone for our visit.  Kylie [name] reports feeling well today.  She denies current pain in her abdomen but requests I do not palpate or move her in the bed as this is  what prompts her to have significant abdominal pain.  Sitter at bedside reports minimal intake today.  Reviewed overall hospitalization as well as most recent updates.  Dietitian following remains concerned about Kylie [name]'s ability to meet nutritional needs.  We discussed TPN as a short-term source of artificial nutrition as well as risks associated with continued TPN.  We discussed at this point Kylie [name] continues to not have progression as medical team anticipated.  Concern expressed for continued decline.  We discussed the difference between continuing with current plan of care versus transitioning our focus to full comfort measures/hospice care. Discussed Kylie [name] would no longer receive aggressive medical interventions such as continuous vital signs, lab work, radiology testing, or medications not focused on comfort. All care would focus on how the patient is looking and feeling. This would include management of any symptoms that may cause discomfort, pain, shortness of breath, cough, nausea, agitation, anxiety, and/or secretions etc. Symptoms would be managed with medications and other non-pharmacological interventions such as spiritual support if requested, repositioning, music therapy, or therapeutic  listening. Family verbalized understanding and appreciation.   PACE provider shared insight into pathways program provided.  Pathways in alignment with comfort care/end-of-life care that can be provided at [facility].  Focus will be comfort and dignity avoiding recurrent hospitalizations as well as treating symptoms and place.  Kylie [name], Kylie [name] as well as Kylie [name] in agreement to pursuing pathways through PACE on return to [facility].  We discussed CODE STATUS and the difference between full code and DO NOT RESUSCITATE. Encouraged patient/family to consider DNR/DNI status understanding evidenced based poor outcomes in similar hospitalized patients, as the cause of the arrest is likely associated with chronic/terminal disease rather than a reversible acute cardio-pulmonary event.  All are in agreement with DNR/DNI-order placed to reflect as well as completion of DNR form.  We also completed a MOST form: I completed a MOST form today and the signed original was placed in the chart. Each section of options on the form were reviewed in full detail and any questions were answered as needed. The form was scanned and sent to medical records for it to be uploaded under ACP tab in Epic. A photocopy was also placed in the chart to be scanned into EMR. The patient outlined their wishes for the following treatment decisions:  Cardiopulmonary Resuscitation: Do Not Attempt Resuscitation (DNR/No CPR)  Medical Interventions: Comfort Measures: Keep clean, warm, and dry. Use medication by any route, positioning, wound care, and other measures to relieve pain and suffering. Use oxygen, suction and manual treatment of airway obstruction as needed for comfort. Do not transfer to the hospital unless comfort needs cannot be met in current location.  Antibiotics: Antibiotics if indicated  IV Fluids: No IV fluids (provide other measures to ensure comfort)  Feeding Tube: No feeding tube    Answered and addressed all questions and  concerns.  Plan for Kylie [name] to return to [facility] today with pathways.  Comfort orders placed in chart and recommendations for discharge medications discussed with primary team.  Objective:  Physical Exam Constitutional:      General: She is not in acute distress.    Appearance: She is ill-appearing.  HENT:     Head: Normocephalic.     Mouth/Throat:     Mouth: Mucous membranes are dry.  Pulmonary:     Effort: Pulmonary effort is normal. No respiratory distress.  Abdominal:     General: There is distension.  Tenderness: There is abdominal tenderness.  Skin:    General: Skin is warm and dry.     Coloration: Skin is not jaundiced.  Neurological:     Mental Status: She is alert.     Motor: Weakness present.     Comments: Oriented to person and place  Psychiatric:        Mood and Affect: Mood normal.        Behavior: Behavior normal.        Thought Content: Thought content normal.             Vital Signs: BP (!) 102/54 (BP Location: Right Arm)   Pulse 80   Temp 98.3 F (36.8 C) (Oral)   Resp 20   Ht 5' 10 (1.778 m)   Wt 83.9 kg   SpO2 100%   BMI 26.54 kg/m  SpO2: SpO2: 100 % O2 Device: O2 Device: Room Air O2 Flow Rate: O2 Flow Rate (L/min): 6 L/min   Palliative Care Assessment & Plan   Assessment/Recommendation/Plan  DNR/DNI/comfort Completion of most Plan to DC back to [facility] with pathways (comfort care) under PACE  Care plan was discussed with primary team and nursing staff  Thank you for allowing the Palliative Medicine Team to assist in the care of this patient.  Visit includes: Detailed review of medical records (labs, imaging, vital signs), medically appropriate exam (mental status, respiratory, cardiac, skin), discussed with treatment team, counseling and educating patient, family and staff, documenting clinical information, medication management and coordination of care.  Waddell Lesches, DNP, AGNP-C Palliative Medicine   Please  contact Palliative Medicine Team phone at [phone] for questions and concerns.

## 2024-05-19 NOTE — Progress Notes (Addendum)
 Nutrition Follow-up  DOCUMENTATION CODES:   Non-severe (moderate) malnutrition in context of chronic illness  INTERVENTION:   -D/c 48 hour calorie count -D/c TPN management per MD -Continue daily weights -Liberalize diet to regular for widest variety of meal selections -Continue feeding assistance with meals  -Continue Ensure Plus High Protein po TID, each supplement provides 350 kcal and 20 grams of protein  -Continue Magic cup TID with meals, each supplement provides 290 kcal and 9 grams of protein  -Discussed calorie count results with team; patient is refusing care at times and refuses all care from female providers. Patient intake has not improved since last week's calorie count and she is not making progress to meet her nutritional needs via PO route. Family does not want PEG and TPN was d/c on 05/18/24. Per palliative care notes, plan to transition to comfort care this afternoon after family meeting with long term plan of discharging back to SNF [facility]) with hospice services with focus on comfort. At this point, there are no further interventions that RD would be able to offer.   NUTRITION DIAGNOSIS:   Moderate Malnutrition related to chronic illness as evidenced by moderate fat depletion, moderate muscle depletion.  Ongoing  GOAL:   Patient will meet greater than or equal to 90% of their needs  Unmet  MONITOR:   Diet advancement, Labs, Weight trends, Skin, I & O's, Other (Comment) (TPN)  REASON FOR ASSESSMENT:   Consult New TPN/TNA  ASSESSMENT:   68 y.o. female with h/o dementia, HTN, HLD, CHF, CVA, CKD-3a, depression, DM, PE on Xarelto , RCC s/p right nephrectomy (2009), hypothryoidism, CAD and OSA who is admitted with SBO secondary to Internal hernia resulting in small bowel ischemia and necrosis now s/p exploratory laparotomy, reduction of internal hernia, small bowel resection with cecectomy (~110cm ileum along with IC valve) and ileocolic anastomosis  10/31.  89/68- s/p s/p exploratory laparotomy with small bowel resection, ileocecectomy, ileocolonic anastomosis and reduction of internal hernia, PICC placed, TPN initiated 11/1- NGT removed by patient, pulled out PICC line 11/2- PICC line replaced 11/3- IV thiamine  ordered 11/6- advanced to clear liquid diet 11/7- advanced to full liquid diet, penrose drain removed 11/11- advanced to soft diet, TPN at half rate  11/13- downgraded to clear liquid diet, calorie count completed (meeting 4% of needs) 11/14- TPN advanced to full rate. Patient refused staple removal, PICC replaced 11/15- KUB consistent with ileus 11/17- advanced to full liquid diet 11/19- advanced to full liquid diet, calorie count initiated 11/21- calorie count completed, plan family meeting today for transition to comfort care, plan to discharge to SNF with hospice services  Reviewed I/O's: +2 L x 24 hours and +14.1 L since 05/05/24  UOP: 500 ml x 24 hours  Case discussed with pharmacy, TPN was hung yesterday. Patient remains on full rate of TPN secondary to poor oral intake. TPN infusing at 75 ml/hr, which provides 2005 kcals and 108 grams protein, which provides 100% of estimated kcal and protein needs. Per discussion with RN and pharmacist, plan to d/c TPN today.   11/19 Breakfast: 68 kcals, 2 grams protein Lunch: 36 kcals, 1 gram protein Dinner: 27 kcals, 2 grams protein   Total intake: 131 kcal (7% of minimum estimated needs)  5 grams protein (5% of minimum estimated needs)  11/20 Breakfast: 41 kcals, 1 grams protein Lunch: 0% Dinner: 0%   Total intake: 41 kcal (2% of minimum estimated needs)  1 grams protein (1% of minimum estimated needs)  Average  Total intake: 86 kcal (4% of minimum estimated needs)  3 grams protein (3% of minimum estimated needs)  Case discussed with RN, palliative care, and MD. Reviewed calorie count results; patient is meeting less of 5% of her nutritional needs. RD will d/c  calorie count. There has been no improvement in her oral intake since calorie count completed last week. Discussed that patient is not meeting nutritional needs via PO route. Per palliative care, plan to transition to comfort care today after family meeting (TPN has been d/c and family does not want a PEG). There is nothing further that RD can further offer her at this time.   Medications reviewed and include megace , melatonin, and senokot.   Labs reviewed: CBGS: 99-116 (inpatient orders for glycemic control are 0-6 units insulin  aspart every 6 hours).    Diet Order:   Diet Order             DIET SOFT Fluid consistency: Thin  Diet effective now                   EDUCATION NEEDS:   Not appropriate for education at this time  Skin:  Skin Assessment: Skin Integrity Issues: Skin Integrity Issues:: Incisions Incisions: closed abdomen  Last BM:  05/17/24 (type 6)  Height:   Ht Readings from Last 1 Encounters:  04/28/24 5' 10 (1.778 m)    Weight:   Wt Readings from Last 1 Encounters:  05/19/24 83.9 kg    Ideal Body Weight:  68.1 kg  BMI:  Body mass index is 26.54 kg/m.  Estimated Nutritional Needs:   Kcal:  2000-2300kcal/day  Protein:  100-115g/day  Fluid:  1.8-2.1L/day    Margery ORN, RD, LDN, CDCES Registered Dietitian III Certified Diabetes Care and Education Specialist If unable to reach this RD, please use RD Inpatient group chat on secure chat between hours of 8am-4 pm daily
